# Patient Record
Sex: Female | Born: 1940 | Race: White | Hispanic: No | Marital: Married | Smoking: Never smoker
Health system: Southern US, Community
[De-identification: ages and names within clinical notes are randomized; demographics above are authoritative.]

## PROBLEM LIST (undated history)

## (undated) DIAGNOSIS — Z9889 Other specified postprocedural states: Secondary | ICD-10-CM

## (undated) DIAGNOSIS — E785 Hyperlipidemia, unspecified: Secondary | ICD-10-CM

## (undated) DIAGNOSIS — Z8739 Personal history of other diseases of the musculoskeletal system and connective tissue: Secondary | ICD-10-CM

## (undated) DIAGNOSIS — T8859XA Other complications of anesthesia, initial encounter: Secondary | ICD-10-CM

## (undated) DIAGNOSIS — R112 Nausea with vomiting, unspecified: Secondary | ICD-10-CM

## (undated) DIAGNOSIS — K219 Gastro-esophageal reflux disease without esophagitis: Secondary | ICD-10-CM

## (undated) HISTORY — PX: CARDIAC CATHETERIZATION: SHX172

## (undated) HISTORY — PX: PARTIAL HYSTERECTOMY: SHX80

## (undated) HISTORY — PX: OTHER SURGICAL HISTORY: SHX169

## (undated) HISTORY — DX: Hyperlipidemia, unspecified: E78.5

## (undated) HISTORY — PX: TONSILLECTOMY: SUR1361

## (undated) HISTORY — PX: ABDOMINAL HYSTERECTOMY: SHX81

## (undated) HISTORY — DX: Personal history of other diseases of the musculoskeletal system and connective tissue: Z87.39

---

## 2004-05-24 ENCOUNTER — Emergency Department (HOSPITAL_COMMUNITY): Admission: EM | Admit: 2004-05-24 | Discharge: 2004-05-24 | Payer: Self-pay | Admitting: *Deleted

## 2005-07-18 ENCOUNTER — Ambulatory Visit: Payer: Self-pay | Admitting: Internal Medicine

## 2006-08-07 ENCOUNTER — Ambulatory Visit: Payer: Self-pay | Admitting: Internal Medicine

## 2006-08-08 ENCOUNTER — Ambulatory Visit: Payer: Self-pay | Admitting: Internal Medicine

## 2006-09-01 ENCOUNTER — Encounter: Admission: RE | Admit: 2006-09-01 | Discharge: 2006-09-01 | Payer: Self-pay | Admitting: Internal Medicine

## 2007-02-11 ENCOUNTER — Ambulatory Visit: Payer: Self-pay | Admitting: Surgery

## 2007-04-02 ENCOUNTER — Ambulatory Visit: Payer: Self-pay | Admitting: Gastroenterology

## 2007-04-06 ENCOUNTER — Ambulatory Visit: Payer: Self-pay | Admitting: Gastroenterology

## 2007-08-13 ENCOUNTER — Ambulatory Visit: Payer: Self-pay | Admitting: Internal Medicine

## 2008-03-16 ENCOUNTER — Ambulatory Visit: Payer: Self-pay | Admitting: Cardiology

## 2008-03-16 ENCOUNTER — Encounter: Payer: Self-pay | Admitting: Cardiology

## 2008-03-16 LAB — CONVERTED CEMR LAB
BUN: 18 mg/dL (ref 6–23)
CO2: 23 meq/L (ref 19–32)
Calcium: 9.9 mg/dL (ref 8.4–10.5)
Chloride: 102 meq/L (ref 96–112)
Creatinine, Ser: 0.71 mg/dL (ref 0.40–1.20)
Glucose, Bld: 95 mg/dL (ref 70–99)
HCT: 42.1 % (ref 36.0–46.0)
Hemoglobin: 13.5 g/dL (ref 12.0–15.0)
INR: 0.9 (ref 0.0–1.5)
MCHC: 32.1 g/dL (ref 30.0–36.0)
MCV: 97.7 fL (ref 78.0–100.0)
Platelets: 263 10*3/uL (ref 150–400)
Potassium: 4.5 meq/L (ref 3.5–5.3)
Prothrombin Time: 12.5 s (ref 11.6–15.2)
RBC: 4.31 M/uL (ref 3.87–5.11)
RDW: 13.4 % (ref 11.5–15.5)
Sodium: 140 meq/L (ref 135–145)
WBC: 5.5 10*3/uL (ref 4.0–10.5)

## 2008-03-17 ENCOUNTER — Ambulatory Visit: Payer: Self-pay | Admitting: Cardiovascular Disease

## 2008-03-17 ENCOUNTER — Inpatient Hospital Stay (HOSPITAL_BASED_OUTPATIENT_CLINIC_OR_DEPARTMENT_OTHER): Admission: RE | Admit: 2008-03-17 | Discharge: 2008-03-17 | Payer: Self-pay | Admitting: Cardiology

## 2008-03-29 ENCOUNTER — Ambulatory Visit: Payer: Self-pay | Admitting: Cardiology

## 2008-09-08 ENCOUNTER — Ambulatory Visit: Payer: Self-pay | Admitting: Internal Medicine

## 2009-10-25 ENCOUNTER — Ambulatory Visit: Payer: No Typology Code available for payment source | Admitting: Internal Medicine

## 2010-01-08 ENCOUNTER — Encounter: Payer: Self-pay | Admitting: Cardiovascular Disease

## 2010-03-06 ENCOUNTER — Ambulatory Visit: Payer: Self-pay | Admitting: Cardiovascular Disease

## 2010-03-06 DIAGNOSIS — E785 Hyperlipidemia, unspecified: Secondary | ICD-10-CM | POA: Insufficient documentation

## 2010-03-06 DIAGNOSIS — I2510 Atherosclerotic heart disease of native coronary artery without angina pectoris: Secondary | ICD-10-CM | POA: Insufficient documentation

## 2010-03-15 ENCOUNTER — Ambulatory Visit: Payer: No Typology Code available for payment source | Admitting: Internal Medicine

## 2010-05-30 ENCOUNTER — Encounter: Admission: RE | Admit: 2010-05-30 | Discharge: 2010-05-30 | Payer: Self-pay | Admitting: Gastroenterology

## 2010-08-01 ENCOUNTER — Telehealth: Payer: Self-pay | Admitting: Cardiovascular Disease

## 2010-10-03 NOTE — Progress Notes (Signed)
Summary: RX  Phone Note Refill Request Call back at Home Phone [phone] Message from:  Patient on August 01, 2010 2:32 PM  GENERIC LIPITOR @ [facility] [address]  Initial call taken by: Harlon Flor,  August 01, 2010 2:32 PM  Follow-up for Phone Call        what dose of lipitor do you suggest she take? Follow-up by: Bishop Dublin, CMA,  August 03, 2010 12:00 PM  Additional Follow-up for Phone Call Additional follow up Details #1::        LM with patient waiting to hear back from Dr. Mariah Milling regarding dose of lipitor.  Patient has muscle aches with simvastatin has been taking 1/2 her dose. Additional Follow-up by: Lysbeth Galas CMA,  August 03, 2010 3:05 PM    Additional Follow-up for Phone Call Additional follow up Details #2::    Try lipitor 20 to start. recheck lipids in 3 months   Appended Document: RX notified patient need to stop the simvastatin and start on lipitor 20 mg one tablet at bedtime. Rx called into [facility] pharmacy.   Prescriptions: LIPITOR 20 MG TABS (ATORVASTATIN CALCIUM) one tablet at bedtime  #30 x 3   Entered by:   Bishop Dublin, CMA   Authorized by:   Dossie Arbour MD   Signed by:   Bishop Dublin, CMA on 08/06/2010   Method used:   Faxed to ...       [facility] (retail)       [address]       [place], [place]    [place]       Ph: [phone]       Fax: [phone]   RxID:   [id number] LIPITOR 20 MG TABS (ATORVASTATIN CALCIUM) one tablet at bedtime  #30 x 3   Entered by:   Bishop Dublin, CMA   Authorized by:   Dossie Arbour MD   Signed by:   Bishop Dublin, CMA on 08/06/2010   Method used:   Print then Give to Patient   RxID:   [id number]

## 2010-10-03 NOTE — Assessment & Plan Note (Signed)
Summary: EC6/AMD   Visit Type:  Follow-up Referring Provider:  Valera Castle, M.D. Primary Provider:  Thalia Party.  CC:  No cardiac problems..  History of Present Illness: 70 year old woman with a past medical history of chest discomfort, stress test that showed inferior wall ischemia, referred for cardiac catheterization that showed 30% nonobstructive LAD disease in July 2009, hyperlipidemia with normal systolic function who presents for routine followup.  Overall she states that she is doing well. She does not exercise to any significant degree. She is otherwise asymptomatic with no significant symptoms of shortness of breath, chest discomfort. She has been tolerating her medications well with no symptoms. She is otherwise active and has no complaints.  EKG shows normal sinus rhythm with a rate 77 beats per minute, no significant ST or T wave changes.  Preventive Screening-Counseling & Management  Caffeine-Diet-Exercise     Does Patient Exercise: no      Drug Use:  no.    Current Medications (verified): 1)  Boniva 2)  Aspirin 81 Mg Tbec (Aspirin) .Marland Kitchen.. 1 Once Daily 3)  Zocor 40 Mg Tabs (Simvastatin) .Marland Kitchen.. 1 Once Daily  Allergies (verified): No Known Drug Allergies  Past History:  Past Medical History: Last updated: 08/03/2009 1. Ther is no diabetes mellitus or hypertension, but there is hyperlipidemia 2. She has had a history of osteoporosis 3. She has had a prior partial hysterectomy and a tonsillectomy, but there are no other surgeries noted.  Family History: Last updated: 08/03/2009 Positive for coronary artery disease. Her father had coronary artery bypassing graft at age 20. Her mother has a history of congestive heart failure.   Social History: Last updated: 03/06/2010 She doesn't smoke or consume alcohol Regular Exercise - no Drug Use - no Retired  Married   Past Surgical History: partial hysterectomy tonsillectomy  Social History: She doesn't smoke or  consume alcohol Regular Exercise - no Drug Use - no Retired  Married  Does Patient Exercise:  no Drug Use:  no  Review of Systems  The patient denies fever, weight loss, weight gain, vision loss, decreased hearing, hoarseness, chest pain, syncope, dyspnea on exertion, peripheral edema, prolonged cough, abdominal pain, incontinence, muscle weakness, depression, and enlarged lymph nodes.    Vital Signs:  Patient profile:   70 year old female Height:      65 inches Weight:      110 pounds BMI:     18.37 Pulse rate:   77 / minute BP sitting:   112 / 69  (right arm) Cuff size:   regular  Vitals Entered By: Bishop Dublin, CMA (March 06, 2010 3:11 PM)  Physical Exam  General:  Well developed, well nourished, in no acute distress. Head:  normocephalic and atraumatic Neck:  Neck supple, no JVD. No masses, thyromegaly or abnormal cervical nodes. Chest Wall:  no deformities or breast masses noted Lungs:  Clear bilaterally to auscultation and percussion. Heart:  Non-displaced PMI, chest non-tender; regular rate and rhythm, S1, S2 without murmurs, rubs or gallops. Carotid upstroke normal, no bruit.  Pedals normal pulses. No edema, no varicosities. Abdomen:  Bowel sounds positive; abdomen soft and non-tender without masses Msk:  Back normal, normal gait. Muscle strength and tone normal. Pulses:  pulses normal in all 4 extremities Extremities:  No clubbing or cyanosis. Neurologic:  Alert and oriented x 3. Skin:  Intact without lesions or rashes. Psych:  Normal affect.   Impression & Recommendations:  Problem # 1:  CAD, NATIVE VESSEL (ICD-414.01) mild nonobstructive  LAD disease seen by cardiac catheterization in 2009. We'll try to obtain her most recent lipid panel from Dr. Hyacinth Meeker. Her goal LDL should be less than 100, preferably close to 70. We have talked her at length about cholesterol medications. We should not go up to a higher dose of simvastatin given the recent FDA  recommendation. One would be to change her to Crestor either 10 or 20 mg daily. Alternatively, we could try Vytorin 10/40 mg daily. She liked to check the prosthesis medicines. Alternatively we could start her on Lipitor 80 mg when it comes out generic later this year.  We have encouraged her to increase her exercise as she used to walk 3 times a week and now does not walk at all.  Her updated medication list for this problem includes:    Aspirin 81 Mg Tbec (Aspirin) .Marland Kitchen... 1 once daily  Other Orders: EKG w/ Interpretation (93000)  Patient Instructions: 1)  Your physician recommends that you continue on your current medications as directed. Please refer to the Current Medication list given to you today. 2)  Your physician wants you to follow-up in:   1 year You will receive a reminder letter in the mail two months in advance. If you don't receive a letter, please call our office to schedule the follow-up appointment.

## 2010-11-06 ENCOUNTER — Ambulatory Visit: Payer: No Typology Code available for payment source | Admitting: Internal Medicine

## 2011-01-15 NOTE — Assessment & Plan Note (Signed)
[facility] OFFICE NOTE   NAME:HARRELLKya, Mayfield                      MRN:          [id number]  DATE:03/29/2008                            DOB:          1941-07-13    Ms. Petitjean returns today after having cardiac catheterization for what  sounded like angina and a positive stress echocardiogram showing  inferior wall ischemia.   Her catheterization showed nonobstructive coronary artery disease with a  30% stenosis in the proximal LAD, otherwise no significant plaque.  She  has normal left ventricular function, EF of 55%.   She has had no angina or any symptoms since her catheterization.   Her medications are,  1. Zocor 40 mg a day.  2. Aspirin 81 mg a day.  She is carrying sublingual nitroglycerin.   I noticed that her blood work demonstrated an LDL of 113, though a HDL  of 75, total cholesterol of 200 on Zocor.   PHYSICAL EXAMINATION:  VITAL SIGNS:  Her blood pressure is 112/60, pulse  82 and regular, weight is 114.  GENERAL:  She is in no acute distress.  HEENT:  Unchanged.  NECK:  Carotid upstrokes are equal bilaterally without bruits.  No JVD.  Thyroid is not enlarged.  Trachea is midline.  LUNGS:  Clear.  HEART:  Regular rate and rhythm.  No gallop.  ABDOMEN:  Soft.  Her catheterization site is clean with no bruit.  EXTREMITIES:  Pulses are intact.  There is no edema.   I have explained everything to Mrs. Corsello today found on a cardiac  catheterization.  We talked about the importance of secondary prevention  with Zocor and aspirin, reducing plaque progression as well as plaque  rupture.   Dr. Hyacinth Meeker may wish to push her LDL lower, though the HDL 75 is quite  encouraging.  Before increase her Zocor to 80, I would probably just put  her on Crestor 20 for hopefully less side effects and more potency.  I  will leave this to Dr. Rondel Baton expertise.   We will plan on seeing her back p.r.n. or in 2  years.     Thomas C. Daleen Squibb, MD, [facility]  Electronically Signed    TCW/MedQ  DD: 03/29/2008  DT: 03/30/2008  Job #: [id number]   cc:   [name], M.D.

## 2011-01-15 NOTE — Assessment & Plan Note (Signed)
[facility] OFFICE NOTE   NAME:[name], Daniel                      MRN:          [id number]  DATE:03/16/2008                            DOB:          Jan 11, 1941    HISTORY OF PRESENT ILLNESS:  [name] is a very pleasant 70 year old  female who I am asked to evaluate for chest pain.  For the past 6 weeks,  she has noticed fatigue and a tired feeling in her chest when she  exerts herself to a more significant degree.  There is also a dull  sensation that radiates to her back.  There is no associated nausea,  vomiting, or diaphoresis, but there is shortness of breath.  Pain is not  pleuritic, positional, nor is it related to food.  Again, it only occurs  with more moderate activities.  It does not occur with routine  activities or at rest.  She has been seen by Dr. Hyacinth Meeker for these  symptoms and he was concerned about the possibility of angina.  He did  perform a stress echocardiogram on March 10, 2008.  The patient's resting  LV function was normal.  With exercise, there was evidence of inferior  wall ischemia by his report.  Because of the above, we were asked to  further evaluate.  Note, she does not have orthopnea, PND, pedal edema,  palpitations, presyncope, syncope, or claudication.   ALLERGIES:  She has no known drug allergies.   PRESENT MEDICATIONS:  1. Zocor 40 mg p.o. daily.  2. Aspirin 81 mg p.o. daily.  3. Plavix 75 mg p.o. daily.  4. Vitamin D and Calcium.  5. Boniva.  6. She takes sublingual nitroglycerin as needed.  7. Tylenol as needed.   SOCIAL HISTORY:  She does not smoke nor does she consume alcohol.   FAMILY HISTORY:  Positive for coronary artery disease.  Her father had  coronary artery bypassing graft at age 20.  Her mother has a history of  congestive heart failure.   PAST MEDICAL HISTORY:  1. There is no diabetes mellitus or hypertension, but there is      hyperlipidemia.  2.  She has had a history of osteoporosis.  3. She has had a prior partial hysterectomy and a tonsillectomy, but      there are no other surgeries noted.   REVIEW OF SYSTEMS:  She denies any headaches, fevers, or chills.  There  is no productive cough or hemoptysis.  There is no dysphagia,  odynophagia, melena, or hematochezia.  There is no dysuria or hematuria.  There is no rash or seizure activity.  There is no orthopnea, PND, or  pedal edema.  Remaining systems are negative.   PHYSICAL EXAMINATION:  VITAL SIGNS:  Today, shows a blood pressure of  104/64 and her pulse is 82.  She weighs 112 pounds.  GENERAL:  She is well developed and well nourished in no acute distress.  She does not appear to be depressed.  SKIN:  Warm and dry.  BACK:  Normal.  HEENT:  Normal with normal eyelids.  NECK:  Supple with a normal upstroke bilaterally.  No bruits noted.  There is no jugular venous distention, and I could not appreciate  thyromegaly.  CHEST:  Clear to auscultation.  Normal expansion.  CARDIOVASCULAR:  Regular rate and rhythm with normal S1 and S2.  There  are no murmurs, rubs, or gallops noted.  ABDOMEN:  No tenderness.  Nondistended.  Positive bowel sounds.  No  hepatosplenomegaly.  No mass is appreciated.  There is no abdominal  bruit.  She has 2+ femoral pulses bilaterally.  No bruits.  EXTREMITIES:  No edema.  I could palpate no cords.  She has 2+ dorsalis  pedis pulses bilaterally.  There is no peripheral clubbing.  NEUROLOGIC:  Grossly intact.   Her electrocardiogram today shows a sinus rhythm at a rate of 82.  There  is RV conduction delay, but there are no ST changes noted.   DIAGNOSES:  1. Probable new-onset angina - Ms. Donaghy's symptoms are concerning      for angina.  She also has a stress echocardiogram as interpreted by      Dr. Hyacinth Meeker that suggests inferior ischemia.  We will plan to      proceed with cardiac catheterization.  The risk and benefits have      been  discussed and she agrees to proceed.  Note, she has had no      symptoms at rest and they typically only occur with moderate      exertion.  I have asked her to be brought to the emergency room if      any symptoms or worsening prior to the procedure including any      symptoms at rest.  Note, she held her aspirin and Plavix as she was      not sure if she should stay on this prior to her catheterization.      I have asked that she resume this and she will also continue on her      statin.  We will also add low-dose Lopressor 12.5 mg p.o. b.i.d.      We will make further recommendations once the results of the      catheterization are known.  2. Hyperlipidemia - She will continue on her Zocor, and Dr. Hyacinth Meeker is      following her lipids and liver.  If she does in deed have      demonstrated coronary artery disease, then our goal LDL should be      less than 70.   She will return to [employer] in 4 weeks.     Madolyn Frieze Jens Som, MD, [facility]  Electronically Signed    BSC/MedQ  DD: 03/16/2008  DT: 03/16/2008  Job #: [id number]   cc:   Bethann Punches

## 2011-01-15 NOTE — Cardiovascular Report (Signed)
[name], [name]             ACCOUNT NO.:  [id number]   MEDICAL RECORD NO.:  [id number]          PATIENT TYPE:  OIB   LOCATION:  1963                         FACILITY:  [facility]   PHYSICIAN:  Veverly Fells. Excell Seltzer, MD  DATE OF BIRTH:  1941/05/11   DATE OF PROCEDURE:  03/17/2008  DATE OF DISCHARGE:  03/17/2008                            CARDIAC CATHETERIZATION   PROCEDURES:  Left heart catheterization, selective coronary angiography,  left ventricular angiography.   INDICATIONS:  This is a 70 year old woman with exertional chest  heaviness and very compelling symptoms for angina.  She underwent a  stress echocardiogram that demonstrated inferior wall ischemia.  She was  referred for cardiac catheterization.   PROCEDURAL DETAILS:  Risks and indications of the procedure were  reviewed with the patient.  Informed consent was obtained.  The right  groin was prepped, draped, and anesthetized with 1% lidocaine.  Using  the modified Seldinger technique, a 4-French sheath was placed in the  right femoral artery.  Standard 4-French Judkins catheters were used for  coronary angiography and left ventriculography.  A pull-back across the  aortic valve was performed.  All catheters were exchanged were performed  over a guidewire.  There were no immediate complications.  The patient  tolerated the procedure well.   FINDINGS:  Aortic pressure 106/53 with mean of 78, left ventricular  pressure 104/90.   Coronary angiography:  Left mainstem no significant stenosis, bifurcates  into the LAD and left circumflex.   LAD:  The LAD is a large-caliber vessel that courses down and wraps  around the LV apex.  There is a large first perforator that supplies  multiple perforating branches.  There are three main diagonal branches  from the LAD.  The first diagonal is large.  The second and third  diagonals are moderate-sized.  The proximal LAD just before the first  diagonal has an area of ectasia and  nonobstructive stenosis of 30%.  The  remaining portions of the mid and distal LAD have no significant  stenoses.   Left circumflex:  The left circumflex is widely patent throughout.  It  is a moderate-sized vessel.  The circumflex supplies small first and  second OM branches and a large third OM branch.  The AV groove  circumflex beyond the third OM is very small.  There are no significant  stenoses throughout the left circumflex.   Right coronary artery:  The right coronary artery is dominant.  It is a  moderate-size vessel throughout that supplies a large PDA branch and a  small posterolateral branch.  The right coronary artery is widely patent  with no significant stenoses throughout.   Left ventricular function assessed by left ventriculography is normal.  The left ventricular apex has a rounded appearance but contracts  normally.  The LVEF is estimated at 55%.  Of note, there is a persistent  Thebesian system noted.   ASSESSMENT:  1. Minor nonobstructive proximal left anterior descending stenosis.  2.:  No significant stenosis in the left circumflex or right coronary  artery.  1. Normal left ventricular function.   PLAN:  Recommend  medical therapy for nonobstructive CAD.  The patient to  follow up with Dr. Jens Som.      Veverly Fells. Excell Seltzer, MD  Electronically Signed     MDC/MEDQ  D:  03/17/2008  T:  03/18/2008  Job:  [id number]   cc:   Leonarda Salon Jens Som, MD, [facility]

## 2011-02-12 ENCOUNTER — Encounter: Payer: Self-pay | Admitting: Cardiology

## 2011-04-10 ENCOUNTER — Ambulatory Visit: Payer: No Typology Code available for payment source | Admitting: Internal Medicine

## 2011-04-29 ENCOUNTER — Ambulatory Visit: Payer: No Typology Code available for payment source | Admitting: Internal Medicine

## 2011-12-02 ENCOUNTER — Ambulatory Visit: Payer: Self-pay | Admitting: Internal Medicine

## 2011-12-11 ENCOUNTER — Ambulatory Visit: Payer: Self-pay | Admitting: Internal Medicine

## 2013-01-20 ENCOUNTER — Ambulatory Visit: Payer: Self-pay | Admitting: Internal Medicine

## 2013-09-29 ENCOUNTER — Ambulatory Visit (INDEPENDENT_AMBULATORY_CARE_PROVIDER_SITE_OTHER): Payer: Medicare Other | Admitting: Podiatry

## 2013-09-29 ENCOUNTER — Encounter: Payer: Self-pay | Admitting: Podiatry

## 2013-09-29 VITALS — BP 110/65 | HR 83 | Resp 16 | Ht 65.0 in | Wt 102.0 lb

## 2013-09-29 DIAGNOSIS — B351 Tinea unguium: Secondary | ICD-10-CM

## 2013-09-29 DIAGNOSIS — M79609 Pain in unspecified limb: Secondary | ICD-10-CM

## 2013-09-29 MED ORDER — EFINACONAZOLE 10 % EX SOLN: 1.0000 [drp] | Freq: Every day | CUTANEOUS | Status: DC

## 2013-09-29 NOTE — Progress Notes (Signed)
She presents today stating that she had pain recently to her hallux left. She states that woke her up and there'll the night with throbbing pain. She's noticed that there is some redness in the base of the toenail on the nail plate which is exquisitely tender on palpation.  Objective: Vital signs are stable she is alert and oriented x3. Pulses are palpable. Her nail plate does demonstrate what appears to the onychomycosis to the tip with to the borders however the proximal border does demonstrate a subungual hematoma that is present. I imagine that her pain was associated with the trauma that injured the nail plate in the first place.  Assessment: Traumatic nail plate injury hallux left with onychomycosis.  Plan: Discussed etiology pathology conservative or surgical therapies at this point I do believe we are going to start [place] will followup with her in 3 months

## 2013-12-27 ENCOUNTER — Other Ambulatory Visit: Payer: Self-pay | Admitting: *Deleted

## 2013-12-27 DIAGNOSIS — B351 Tinea unguium: Secondary | ICD-10-CM

## 2013-12-27 MED ORDER — EFINACONAZOLE 10 % EX SOLN: 1.0000 [drp] | Freq: Every day | CUTANEOUS | Status: DC

## 2013-12-27 NOTE — Telephone Encounter (Signed)
PT CALLED SAID [facility] WILL NO LONGER ACCEPT COUPON FOR JUBLIA. SENT RX FOR JUBLIA TO [facility] 4 ML, 11 REFILLS 10% SOLUTION APPLY 1 DROP TOPICALLY DAILY. I GAVE PT ALL INFO TO [facility]. PT WILL CALL WITH ALL HER INFO. TO PHARMACY.  PT UNDERSTOOD

## 2013-12-29 ENCOUNTER — Ambulatory Visit: Payer: No Typology Code available for payment source | Admitting: Podiatry

## 2013-12-31 ENCOUNTER — Telehealth: Payer: Self-pay | Admitting: *Deleted

## 2013-12-31 NOTE — Telephone Encounter (Signed)
PT CALLED HAD QUESTION REGARDING JUBLIA. CALLED AND LEFT MESSAGE ON PTS VOICEMAIL TO RETURN CALL.

## 2014-01-03 ENCOUNTER — Other Ambulatory Visit: Payer: Self-pay | Admitting: *Deleted

## 2014-01-03 MED ORDER — TAVABOROLE 5 % EX SOLN: CUTANEOUS | Status: DC

## 2014-01-03 NOTE — Telephone Encounter (Signed)
Going to try kerydin , it may be cheaper for the patient.

## 2014-02-09 ENCOUNTER — Emergency Department: Payer: Self-pay | Admitting: Emergency Medicine

## 2014-02-09 LAB — CBC
HCT: 37 % (ref 35.0–47.0)
HGB: 12.6 g/dL (ref 12.0–16.0)
MCH: 31.8 pg (ref 26.0–34.0)
MCHC: 34.1 g/dL (ref 32.0–36.0)
MCV: 93 fL (ref 80–100)
Platelet: 222 10*3/uL (ref 150–440)
RBC: 3.97 10*6/uL (ref 3.80–5.20)
RDW: 13.3 % (ref 11.5–14.5)
WBC: 7.7 10*3/uL (ref 3.6–11.0)

## 2014-02-09 LAB — BASIC METABOLIC PANEL
Anion Gap: 3 — ABNORMAL LOW (ref 7–16)
BUN: 21 mg/dL — ABNORMAL HIGH (ref 7–18)
Calcium, Total: 9 mg/dL (ref 8.5–10.1)
Chloride: 104 mmol/L (ref 98–107)
Co2: 29 mmol/L (ref 21–32)
Creatinine: 1.42 mg/dL — ABNORMAL HIGH (ref 0.60–1.30)
EGFR (African American): 42 — ABNORMAL LOW
EGFR (Non-African Amer.): 37 — ABNORMAL LOW
Glucose: 101 mg/dL — ABNORMAL HIGH (ref 65–99)
Osmolality: 275 (ref 275–301)
Potassium: 3.8 mmol/L (ref 3.5–5.1)
Sodium: 136 mmol/L (ref 136–145)

## 2014-02-09 LAB — TROPONIN I
Troponin-I: <0.02 ng/mL
Troponin-I: <0.02 ng/mL

## 2014-02-10 ENCOUNTER — Encounter (INDEPENDENT_AMBULATORY_CARE_PROVIDER_SITE_OTHER): Payer: Self-pay

## 2014-02-10 ENCOUNTER — Ambulatory Visit (INDEPENDENT_AMBULATORY_CARE_PROVIDER_SITE_OTHER): Payer: Medicare Other | Admitting: Cardiovascular Disease

## 2014-02-10 ENCOUNTER — Encounter: Payer: Self-pay | Admitting: Cardiovascular Disease

## 2014-02-10 VITALS — BP 100/62 | HR 82 | Ht 65.0 in | Wt 105.2 lb

## 2014-02-10 DIAGNOSIS — K219 Gastro-esophageal reflux disease without esophagitis: Secondary | ICD-10-CM

## 2014-02-10 DIAGNOSIS — R079 Chest pain, unspecified: Secondary | ICD-10-CM

## 2014-02-10 DIAGNOSIS — I2510 Atherosclerotic heart disease of native coronary artery without angina pectoris: Secondary | ICD-10-CM

## 2014-02-10 DIAGNOSIS — E785 Hyperlipidemia, unspecified: Secondary | ICD-10-CM

## 2014-02-10 NOTE — Patient Instructions (Addendum)
[place]  Your caregiver has ordered a Stress Test with nuclear imaging. The purpose of this test is to evaluate the blood supply to your heart muscle. This procedure is referred to as a "Non-Invasive Stress Test." This is because other than having an IV started in your vein, nothing is inserted or "invades" your body. Cardiac stress tests are done to find areas of poor blood flow to the heart by determining the extent of coronary artery disease (CAD). Some patients exercise on a treadmill, which naturally increases the blood flow to your heart, while others who are  unable to walk on a treadmill due to physical limitations have a pharmacologic/chemical stress agent called Lexiscan . This medicine will mimic walking on a treadmill by temporarily increasing your coronary blood flow.   Please note: these test may take anywhere between 2-4 hours to complete  PLEASE REPORT TO [facility] AT THE FIRST DESK WILL DIRECT YOU WHERE TO GO  Date of Procedure:_____________6/15/15________________________  Arrival Time for Procedure:__________0745 AM____________________  PLEASE NOTIFY THE OFFICE AT LEAST 24 HOURS IN ADVANCE IF YOU ARE UNABLE TO KEEP YOUR APPOINTMENT.  [phone] AND  PLEASE NOTIFY NUCLEAR MEDICINE AT [facility] AT LEAST 24 HOURS IN ADVANCE IF YOU ARE UNABLE TO KEEP YOUR APPOINTMENT. [phone]  How to prepare for your Myoview test:  1. Do not eat or drink after midnight 2. No caffeine for 24 hours prior to test 3. No smoking 24 hours prior to test. 4. Your medication may be taken with water.  If your doctor stopped a medication because of this test, do not take that medication. 5. Ladies, please do not wear dresses.  Skirts or pants are appropriate. Please wear a short sleeve shirt. 6. No perfume, cologne or lotion. 7. Wear comfortable walking shoes. No heels!  Your physician recommends that you schedule a follow-up appointment in:  As needed

## 2014-02-10 NOTE — Progress Notes (Signed)
Primary care physician: Dr. Emily Filbert  HPI  This is a 73 year-old woman who was referred from the emergency room at [facility] for evaluation of chest pain. She was seen in the past by our team but not recently. She was evaluated in 2009 due to atypical chest discomfort. Stress test was abnormal and showed possible inferior wall ischemia. However, cardiac catheterization  showed 30% nonobstructive LAD disease . She has known history of hyperlipidemia currently on pravastatin but no history of diabetes or hypertension. She is not a smoker. She does have family history of coronary artery disease. Father had CABG at the age of 17. Mother had coronary artery disease but later in life. The patient had a recent episode of substernal aching and heartburn sensation which lasted for 2 hours. This happened yesterday. She went to the emergency room at [facility]. EKG showed no acute changes. Labs were unremarkable including negative cardiac enzymes. Creatinine was mildly elevated at 1.42. Chest x-ray showed no acute abnormalities. The chest discomfort has been continuous since then but overall is mild. It does not worsen with physical activities. It does not seem to be related to food. She does have significant heartburn. No diagnosis was gastroesophageal reflux disease. She was discharged home on Nexium. She reports a 6 pound weight loss over the last year. She has been under stress.  No Known Allergies   Current Outpatient Prescriptions on File Prior to Visit  Medication Sig Dispense Refill  . aspirin 81 MG EC tablet Take 81 mg by mouth daily.        . Denosumab (PROLIA [place]) Inject into the skin. Semi annual injection      . pravastatin (PRAVACHOL) 40 MG tablet Take 40 mg by mouth daily.       No current facility-administered medications on file prior to visit.     Past Medical History  Diagnosis Date  . Hyperlipidemia   . History of osteoporosis      Past Surgical History  Procedure Laterality Date  .  Partial hysterectomy    . Tonsillectomy    . Cardiac catheterization      [facility]  . Cataract       Family History  Problem Relation Age of Onset  . Heart failure Mother     CHF  . Transient ischemic attack Mother   . Coronary artery disease Other   . Heart disease Father   . Heart failure Father      History   Social History  . Marital Status: Married    Spouse Name: N/A    Number of Children: N/A  . Years of Education: N/A   Occupational History  . Retired    Social History Main Topics  . Smoking status: Never Smoker   . Smokeless tobacco: Not on file  . Alcohol Use: No  . Drug Use: No  . Sexual Activity: Not on file   Other Topics Concern  . Not on file   Social History Narrative   Married   Does not get regular exercise     ROS A 10 point review of system was performed. It is negative other than that mentioned in the history of present illness.   PHYSICAL EXAM   BP 100/62  Pulse 82  Ht 5\' 5"  (1.651 m)  Wt 105 lb 4 oz (47.741 kg)  BMI 17.51 kg/m2 Constitutional: She is oriented to person, place, and time. She appears well-developed and well-nourished. No distress.  HENT: No nasal discharge.  Head:  Normocephalic and atraumatic.  Eyes: Pupils are equal and round. No discharge.  Neck: Normal range of motion. Neck supple. No JVD present. No thyromegaly present.  Cardiovascular: Normal rate, regular rhythm, normal heart sounds. Exam reveals no gallop and no friction rub. No murmur heard.  Pulmonary/Chest: Effort normal and breath sounds normal. No stridor. No respiratory distress. She has no wheezes. She has no rales. She exhibits no tenderness.  Abdominal: Soft. Bowel sounds are normal. She exhibits no distension. There is no tenderness. There is no rebound and no guarding.  Musculoskeletal: Normal range of motion. She exhibits no edema and no tenderness.  Neurological: She is alert and oriented to person, place, and time. Coordination normal.  Skin:  Skin is warm and dry. No rash noted. She is not diaphoretic. No erythema. No pallor.  Psychiatric: She has a normal mood and affect. Her behavior is normal. Judgment and thought content normal.     [id number]  Rhythm  WITHIN NORMAL LIMITS   ASSESSMENT AND PLAN

## 2014-02-11 ENCOUNTER — Encounter: Payer: Self-pay | Admitting: Cardiovascular Disease

## 2014-02-11 DIAGNOSIS — K219 Gastro-esophageal reflux disease without esophagitis: Secondary | ICD-10-CM | POA: Insufficient documentation

## 2014-02-11 NOTE — Assessment & Plan Note (Signed)
This is currently being treated with pravastatin.

## 2014-02-11 NOTE — Assessment & Plan Note (Signed)
The patient has known history of mild nonobstructive coronary artery disease on previous cardiac catheterization in 2009 which showed 30% stenosis in the LAD. Current symptoms are atypical and likely GI in nature. However, she had associated exertional dyspnea. Thus, I recommend evaluation with a treadmill nuclear stress test. Continue treatment of risk factors.

## 2014-02-11 NOTE — Assessment & Plan Note (Signed)
Continue treatment with the PPI. We definitely have to exclude a cardiac etiology with atypical presentation. If cardiac workup is negative and she continues to have symptoms, further GI evaluation might be warranted.

## 2014-02-14 ENCOUNTER — Ambulatory Visit: Payer: Self-pay | Admitting: Cardiovascular Disease

## 2014-02-14 ENCOUNTER — Other Ambulatory Visit: Payer: Self-pay

## 2014-02-14 DIAGNOSIS — R079 Chest pain, unspecified: Secondary | ICD-10-CM

## 2015-06-22 ENCOUNTER — Ambulatory Visit
Admission: RE | Admit: 2015-06-22 | Discharge: 2015-06-22 | Disposition: A | Discharge status: Home / Self Care | Payer: Medicare Other | Source: Ambulatory Visit | Attending: Gastroenterology | Admitting: Gastroenterology

## 2015-06-22 ENCOUNTER — Encounter
Admission: RE | Disposition: A | Discharge status: Home / Self Care | Payer: Self-pay | Source: Ambulatory Visit | Attending: Gastroenterology

## 2015-06-22 ENCOUNTER — Ambulatory Visit: Payer: Medicare Other | Admitting: Certified Registered Nurse Anesthetist

## 2015-06-22 ENCOUNTER — Encounter: Payer: Self-pay | Admitting: *Deleted

## 2015-06-22 DIAGNOSIS — Z79899 Other long term (current) drug therapy: Secondary | ICD-10-CM | POA: Insufficient documentation

## 2015-06-22 DIAGNOSIS — Z8489 Family history of other specified conditions: Secondary | ICD-10-CM | POA: Diagnosis not present

## 2015-06-22 DIAGNOSIS — Z7982 Long term (current) use of aspirin: Secondary | ICD-10-CM | POA: Insufficient documentation

## 2015-06-22 DIAGNOSIS — Z9889 Other specified postprocedural states: Secondary | ICD-10-CM | POA: Diagnosis not present

## 2015-06-22 DIAGNOSIS — K625 Hemorrhage of anus and rectum: Secondary | ICD-10-CM | POA: Diagnosis present

## 2015-06-22 DIAGNOSIS — Z9849 Cataract extraction status, unspecified eye: Secondary | ICD-10-CM | POA: Insufficient documentation

## 2015-06-22 DIAGNOSIS — Z8249 Family history of ischemic heart disease and other diseases of the circulatory system: Secondary | ICD-10-CM | POA: Diagnosis not present

## 2015-06-22 DIAGNOSIS — K5730 Diverticulosis of large intestine without perforation or abscess without bleeding: Secondary | ICD-10-CM | POA: Insufficient documentation

## 2015-06-22 DIAGNOSIS — R1310 Dysphagia, unspecified: Secondary | ICD-10-CM | POA: Diagnosis present

## 2015-06-22 DIAGNOSIS — M810 Age-related osteoporosis without current pathological fracture: Secondary | ICD-10-CM | POA: Diagnosis not present

## 2015-06-22 DIAGNOSIS — E785 Hyperlipidemia, unspecified: Secondary | ICD-10-CM | POA: Insufficient documentation

## 2015-06-22 HISTORY — PX: COLONOSCOPY WITH PROPOFOL: SHX5780

## 2015-06-22 HISTORY — PX: ESOPHAGOGASTRODUODENOSCOPY (EGD) WITH PROPOFOL: SHX5813

## 2015-06-22 SURGERY — COLONOSCOPY WITH PROPOFOL
Anesthesia: General

## 2015-06-22 MED ORDER — PROPOFOL 10 MG/ML IV BOLUS
INTRAVENOUS | Status: DC | PRN
  Administered 2015-06-22: 50 mg via INTRAVENOUS
  Administered 2015-06-22: 30 mg via INTRAVENOUS

## 2015-06-22 MED ORDER — MIDAZOLAM HCL 2 MG/2ML IJ SOLN
INTRAMUSCULAR | Status: DC | PRN
  Administered 2015-06-22: 0.5 mg via INTRAVENOUS

## 2015-06-22 MED ORDER — PROPOFOL 500 MG/50ML IV EMUL
INTRAVENOUS | Status: DC | PRN
  Administered 2015-06-22: 120 ug/kg/min via INTRAVENOUS

## 2015-06-22 MED ORDER — SODIUM CHLORIDE 0.9 % IV SOLN
INTRAVENOUS | Status: DC
  Administered 2015-06-22: 100 mL via INTRAVENOUS

## 2015-06-22 MED ORDER — LIDOCAINE HCL (CARDIAC) 20 MG/ML IV SOLN
INTRAVENOUS | Status: DC | PRN
  Administered 2015-06-22: 100 mg via INTRAVENOUS

## 2015-06-22 NOTE — Anesthesia Preprocedure Evaluation (Addendum)
Anesthesia Evaluation  Patient identified by MRN, date of birth, ID band Patient awake    Reviewed: Allergy & Precautions, H&P , NPO status , Patient's Chart, lab work & pertinent test results  History of Anesthesia Complications Negative for: history of anesthetic complications  Airway Mallampati: III  TM Distance: <3 FB Neck ROM: limited    Dental no notable dental hx. (+) Teeth Intact   Pulmonary neg pulmonary ROS,    Pulmonary exam normal breath sounds clear to auscultation       Cardiovascular (-) angina(-) Past MI and (-) DOE negative cardio ROS Normal cardiovascular exam Rhythm:regular Rate:Normal     Neuro/Psych negative neurological ROS  negative psych ROS   GI/Hepatic negative GI ROS, Neg liver ROS, GERD  Controlled,  Endo/Other  negative endocrine ROS  Renal/GU negative Renal ROS  negative genitourinary   Musculoskeletal   Abdominal   Peds  Hematology negative hematology ROS (+)   Anesthesia Other Findings Past Medical History:   Hyperlipidemia                                               History of osteoporosis                                     Past Surgical History:   PARTIAL HYSTERECTOMY                                          TONSILLECTOMY                                                 CARDIAC CATHETERIZATION                                         Comment:[facility]   cataract                                                     BMI    Body Mass Index   18.87 kg/m 2      Reproductive/Obstetrics negative OB ROS                            Anesthesia Physical Anesthesia Plan  ASA: III  Anesthesia Plan: General   Post-op Pain Management:    Induction:   Airway Management Planned:   Additional Equipment:   Intra-op Plan:   Post-operative Plan:   Informed Consent: I have reviewed the patients History and Physical, chart, labs and discussed the procedure  including the risks, benefits and alternatives for the proposed anesthesia with the patient or authorized representative who has indicated his/her understanding and acceptance.   Dental Advisory Given  Plan Discussed with: Anesthesiologist, CRNA and Surgeon  Anesthesia Plan Comments:  Anesthesia Quick Evaluation  

## 2015-06-22 NOTE — Anesthesia Procedure Notes (Signed)
Performed by: Johnna Acosta Pre-anesthesia Checklist: Patient identified, Emergency Drugs available and Suction available Patient Re-evaluated:Patient Re-evaluated prior to inductionOxygen Delivery Method: Nasal cannula

## 2015-06-22 NOTE — Transfer of Care (Signed)
Immediate Anesthesia Transfer of Care Note  Patient: [name]  Procedure(s) Performed: Procedure(s): COLONOSCOPY WITH PROPOFOL (N/A) ESOPHAGOGASTRODUODENOSCOPY (EGD) WITH PROPOFOL (N/A)  Patient Location: PACU  Anesthesia Type:  Level of Consciousness: sedated  Airway & Oxygen Therapy: Patient Spontanous Breathing  Post-op Assessment: Report given to RN  Post vital signs: Reviewed and stable  Last Vitals:  Filed Vitals:   06/22/15 1047  BP: 106/48  Pulse: 94  Temp: 35.7 C  Resp: 14    Complications: No apparent anesthesia complications

## 2015-06-22 NOTE — Progress Notes (Signed)
   06/22/15 1000  Aldrete  Activity 0  Respiration 1  Circulation 1  Consciousness 0  Color 2  Aldrete Score 4     06/22/15 1000  Aldrete  Activity 0  Respiration 1  Circulation 1  Consciousness 0  Color 2  Aldrete Score 4

## 2015-06-22 NOTE — H&P (Signed)
    Primary Care Physician:  Rusty Aus., MD Primary Gastroenterologist:  Dr. Candace Cruise  Pre-Procedure History & Physical: HPI:  [name] is a 74 y.o. female is here for an colonoscopy.   Past Medical History  Diagnosis Date  . Hyperlipidemia   . History of osteoporosis     Past Surgical History  Procedure Laterality Date  . Partial hysterectomy    . Tonsillectomy    . Cardiac catheterization      [facility]  . Cataract      Prior to Admission medications   Medication Sig Start Date End Date Taking? Authorizing Provider  ALPRAZolam Duanne Moron) 0.25 MG tablet Take 0.25 mg by mouth at bedtime as needed for anxiety.   Yes Historical Provider, MD  cholecalciferol (VITAMIN D) 400 UNITS TABS tablet Take 2,000 Units by mouth.   Yes Historical Provider, MD  Ferrous Fumarate-Vitamin C 65-125 MG TABS Take by mouth.   Yes Historical Provider, MD  Multiple Vitamin (MULTIVITAMIN) tablet Take 1 tablet by mouth daily.   Yes Historical Provider, MD  omeprazole (PRILOSEC) 40 MG capsule Take 40 mg by mouth daily.   Yes Historical Provider, MD  aspirin 81 MG EC tablet Take 81 mg by mouth daily.      Historical Provider, MD  Denosumab (PROLIA [place]) Inject into the skin. Semi annual injection    Historical Provider, MD  pravastatin (PRAVACHOL) 40 MG tablet Take 40 mg by mouth daily.    Historical Provider, MD  Tavaborole 5 % SOLN Apply topically daily.    Historical Provider, MD    Allergies as of 05/24/2015  . (No Known Allergies)    Family History  Problem Relation Age of Onset  . Heart failure Mother     CHF  . Transient ischemic attack Mother   . Coronary artery disease Other   . Heart disease Father   . Heart failure Father     Social History   Social History  . Marital Status: Married    Spouse Name: N/A  . Number of Children: N/A  . Years of Education: N/A   Occupational History  . Retired    Social History Main Topics  . Smoking status: Never Smoker   . Smokeless tobacco:  Not on file  . Alcohol Use: No  . Drug Use: No  . Sexual Activity: Not on file   Other Topics Concern  . Not on file   Social History Narrative   Married   Does not get regular exercise    Review of Systems: See HPI, otherwise negative ROS  Physical Exam: There were no vitals taken for this visit. General:   Alert,  pleasant and cooperative in NAD Head:  Normocephalic and atraumatic. Neck:  Supple; no masses or thyromegaly. Lungs:  Clear throughout to auscultation.    Heart:  Regular rate and rhythm. Abdomen:  Soft, nontender and nondistended. Normal bowel sounds, without guarding, and without rebound.   Neurologic:  Alert and  oriented x4;  grossly normal neurologically.  Impression/Plan: [name] is here for an colonoscopy to be performed for dysphagia, rectal bleeding.  Risks, benefits, limitations, and alternatives regarding EGD/colonoscopy have been reviewed with the patient.  Questions have been answered.  All parties agreeable.   Alexandr Yaworski, Lupita Dawn, MD  06/22/2015, 9:32 AM

## 2015-06-22 NOTE — Op Note (Signed)
[facility] Patient Name: [name] Procedure Date: 06/22/2015 10:26 AM MRN: [id number] Account #: [id number] Date of Birth: 15-May-1941 Admit Type: Outpatient Age: 74 Room: [facility] ENDO ROOM 4 Gender: Female Note Status: Finalized Procedure:         Colonoscopy Indications:       Rectal bleeding Providers:         Lupita Dawn. Candace Cruise, MD Referring MD:      Rusty Aus, MD (Referring MD) Medicines:         Monitored Anesthesia Care Complications:     No immediate complications. Procedure:         Pre-Anesthesia Assessment:                    - Prior to the procedure, a History and Physical was                     performed, and patient medications, allergies and                     sensitivities were reviewed. The patient's tolerance of                     previous anesthesia was reviewed.                    - The risks and benefits of the procedure and the sedation                     options and risks were discussed with the patient. All                     questions were answered and informed consent was obtained.                    - After reviewing the risks and benefits, the patient was                     deemed in satisfactory condition to undergo the procedure.                    After obtaining informed consent, the colonoscope was                     passed under direct vision. Throughout the procedure, the                     patient's blood pressure, pulse, and oxygen saturations                     were monitored continuously. The Colonoscope was                     introduced through the anus and advanced to the the cecum,                     identified by appendiceal orifice and ileocecal valve. The                     colonoscopy was performed without difficulty. The patient                     tolerated the procedure well. The quality of the bowel  preparation was good. Findings:      A few small-mouthed  diverticula were found in the sigmoid colon.      The exam was otherwise without abnormality. Impression:        - Diverticulosis in the sigmoid colon.                    - The examination was otherwise normal.                    - No specimens collected. Recommendation:    - Discharge patient to home.                    - The findings and recommendations were discussed with the                     patient. Procedure Code(s): --- Professional ---                    [phone], Colonoscopy, flexible; diagnostic, including                     collection of specimen(s) by brushing or washing, when                     performed (separate procedure) Diagnosis Code(s): --- Professional ---                    K62.5, Hemorrhage of anus and rectum                    K57.30, Diverticulosis of large intestine without                     perforation or abscess without bleeding CPT copyright 2014 American Medical Association. All rights reserved. The codes documented in this report are preliminary and upon coder review may  be revised to meet current compliance requirements. Hulen Luster, MD 06/22/2015 10:46:00 AM This report has been signed electronically. Number of Addenda: 0 Note Initiated On: 06/22/2015 10:26 AM Scope Withdrawal Time: 0 hours 3 minutes 14 seconds  Total Procedure Duration: 0 hours 7 minutes 23 seconds       [facility]

## 2015-06-22 NOTE — Op Note (Signed)
[facility] Patient Name: [name] Procedure Date: 06/22/2015 10:27 AM MRN: [id number] Account #: [id number] Date of Birth: Aug 16, 1941 Admit Type: Outpatient Age: 74 Room: [facility] ENDO ROOM 4 Gender: Female Note Status: Finalized Procedure:         Upper GI endoscopy Indications:       Dysphagia Providers:         Lupita Dawn. Candace Cruise, MD Referring MD:      Rusty Aus, MD (Referring MD) Medicines:         Monitored Anesthesia Care Complications:     No immediate complications. Procedure:         Pre-Anesthesia Assessment:                    - Prior to the procedure, a History and Physical was                     performed, and patient medications, allergies and                     sensitivities were reviewed. The patient's tolerance of                     previous anesthesia was reviewed.                    - The risks and benefits of the procedure and the sedation                     options and risks were discussed with the patient. All                     questions were answered and informed consent was obtained.                    - After reviewing the risks and benefits, the patient was                     deemed in satisfactory condition to undergo the procedure.                    After obtaining informed consent, the endoscope was passed                     under direct vision. Throughout the procedure, the                     patient's blood pressure, pulse, and oxygen saturations                     were monitored continuously. The Endoscope was introduced                     through the mouth, and advanced to the second part of                     duodenum. The upper GI endoscopy was accomplished without                     difficulty. The patient tolerated the procedure well. Findings:      No endoscopic abnormality was evident in the esophagus to explain the       patient's complaint of dysphagia. It was decided, however, to proceed        with dilation  of the entire esophagus. The scope was withdrawn. Dilation       was performed with a Maloney dilator with mild resistance at 57 Fr.      The entire examined stomach was normal.      The examined duodenum was normal. Impression:        - No endoscopic esophageal abnormality to explain                     patient's dysphagia. Esophagus dilated. Dilated.                    - Normal stomach.                    - Normal examined duodenum.                    - No specimens collected. Recommendation:    - Discharge patient to home.                    - Observe patient's clinical course.                    - The findings and recommendations were discussed with the                     patient. Procedure Code(s): --- Professional ---                    [phone], Esophagogastroduodenoscopy, flexible, transoral;                     diagnostic, including collection of specimen(s) by                     brushing or washing, when performed (separate procedure)                    43450, Dilation of esophagus, by unguided sound or bougie,                     single or multiple passes Diagnosis Code(s): --- Professional ---                    R13.10, Dysphagia, unspecified CPT copyright 2014 American Medical Association. All rights reserved. The codes documented in this report are preliminary and upon coder review may  be revised to meet current compliance requirements. Hulen Luster, MD 06/22/2015 10:35:00 AM This report has been signed electronically. Number of Addenda: 0 Note Initiated On: 06/22/2015 10:27 AM      [facility]

## 2015-06-22 NOTE — Anesthesia Postprocedure Evaluation (Signed)
  Anesthesia Post-op Note  Patient: [name]  Procedure(s) Performed: Procedure(s): COLONOSCOPY WITH PROPOFOL (N/A) ESOPHAGOGASTRODUODENOSCOPY (EGD) WITH PROPOFOL (N/A)  Anesthesia type:General  Patient location: PACU  Post pain: Pain level controlled  Post assessment: Post-op Vital signs reviewed, Patient's Cardiovascular Status Stable, Respiratory Function Stable, Patent Airway and No signs of Nausea or vomiting  Post vital signs: Reviewed and stable  Last Vitals:  Filed Vitals:   06/22/15 1115  BP: 107/73  Pulse:   Temp:   Resp:     Level of consciousness: awake, alert  and patient cooperative  Complications: No apparent anesthesia complications

## 2015-06-23 ENCOUNTER — Encounter: Payer: Self-pay | Admitting: Gastroenterology

## 2015-07-11 ENCOUNTER — Other Ambulatory Visit: Payer: Self-pay | Admitting: Sports Medicine

## 2015-07-11 DIAGNOSIS — Z1231 Encounter for screening mammogram for malignant neoplasm of breast: Secondary | ICD-10-CM

## 2015-07-18 ENCOUNTER — Ambulatory Visit
Admission: RE | Admit: 2015-07-18 | Discharge: 2015-07-18 | Disposition: A | Discharge status: Home / Self Care | Payer: Medicare Other | Source: Ambulatory Visit | Attending: Sports Medicine | Admitting: Sports Medicine

## 2015-07-18 ENCOUNTER — Ambulatory Visit: Payer: No Typology Code available for payment source

## 2015-07-18 ENCOUNTER — Other Ambulatory Visit: Payer: Self-pay | Admitting: Sports Medicine

## 2015-07-18 DIAGNOSIS — Z1231 Encounter for screening mammogram for malignant neoplasm of breast: Secondary | ICD-10-CM

## 2016-07-17 ENCOUNTER — Other Ambulatory Visit: Payer: Self-pay | Admitting: Internal Medicine

## 2016-07-17 DIAGNOSIS — Z1231 Encounter for screening mammogram for malignant neoplasm of breast: Secondary | ICD-10-CM

## 2016-08-27 ENCOUNTER — Ambulatory Visit
Admission: RE | Admit: 2016-08-27 | Discharge: 2016-08-27 | Disposition: A | Discharge status: Home / Self Care | Payer: Medicare Other | Source: Ambulatory Visit | Attending: Internal Medicine | Admitting: Internal Medicine

## 2016-08-27 DIAGNOSIS — Z1231 Encounter for screening mammogram for malignant neoplasm of breast: Secondary | ICD-10-CM

## 2017-07-29 ENCOUNTER — Other Ambulatory Visit: Payer: Self-pay | Admitting: Internal Medicine

## 2017-07-29 DIAGNOSIS — Z1231 Encounter for screening mammogram for malignant neoplasm of breast: Secondary | ICD-10-CM

## 2017-08-28 ENCOUNTER — Ambulatory Visit
Admission: RE | Admit: 2017-08-28 | Discharge: 2017-08-28 | Disposition: A | Discharge status: Home / Self Care | Payer: Medicare Other | Source: Ambulatory Visit | Attending: Internal Medicine | Admitting: Internal Medicine

## 2017-08-28 DIAGNOSIS — Z1231 Encounter for screening mammogram for malignant neoplasm of breast: Secondary | ICD-10-CM | POA: Insufficient documentation

## 2018-07-22 ENCOUNTER — Other Ambulatory Visit: Payer: Self-pay | Admitting: Internal Medicine

## 2018-07-22 DIAGNOSIS — Z1231 Encounter for screening mammogram for malignant neoplasm of breast: Secondary | ICD-10-CM

## 2018-08-31 ENCOUNTER — Ambulatory Visit
Admission: RE | Admit: 2018-08-31 | Discharge: 2018-08-31 | Disposition: A | Discharge status: Home / Self Care | Payer: Medicare Other | Source: Ambulatory Visit | Attending: Internal Medicine | Admitting: Internal Medicine

## 2018-08-31 DIAGNOSIS — Z1231 Encounter for screening mammogram for malignant neoplasm of breast: Secondary | ICD-10-CM

## 2019-08-30 ENCOUNTER — Other Ambulatory Visit: Payer: Self-pay | Admitting: Internal Medicine

## 2019-08-30 DIAGNOSIS — Z1231 Encounter for screening mammogram for malignant neoplasm of breast: Secondary | ICD-10-CM

## 2019-09-08 ENCOUNTER — Ambulatory Visit
Admission: RE | Admit: 2019-09-08 | Discharge: 2019-09-08 | Disposition: A | Discharge status: Home / Self Care | Payer: Medicare HMO | Source: Ambulatory Visit | Attending: Internal Medicine | Admitting: Internal Medicine

## 2019-09-08 DIAGNOSIS — Z1231 Encounter for screening mammogram for malignant neoplasm of breast: Secondary | ICD-10-CM | POA: Diagnosis not present

## 2019-11-10 DIAGNOSIS — L570 Actinic keratosis: Secondary | ICD-10-CM

## 2019-11-10 DIAGNOSIS — C4492 Squamous cell carcinoma of skin, unspecified: Secondary | ICD-10-CM

## 2019-11-10 HISTORY — DX: Actinic keratosis: L57.0

## 2019-11-10 HISTORY — DX: Squamous cell carcinoma of skin, unspecified: C44.92

## 2019-11-17 ENCOUNTER — Telehealth: Payer: Self-pay | Admitting: Dermatology

## 2019-11-17 NOTE — Telephone Encounter (Signed)
Spoke with Ms. 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   Exam of left cheek showed 1.4 cm brown patch, so it is unclear if the entire area is an SCCis or if an early SCCis arose within a thin SK or lentigo. Discussed treatment options including 5FU, Mohs surgery or ED&C including risks and benefits to each.   Will check patient in clinic and further discuss options for SCCis of left cheek and also treat AK at shoulder with LN2.   Abby or Claiborne Billings, can you please schedule patient for follow-up in the next approximately 3 weeks? Thank you!

## 2019-11-18 NOTE — Telephone Encounter (Signed)
Called patient and she is scheduled to come in for a 3 week followup on 12/06/19 at 1:30pm.

## 2019-11-24 ENCOUNTER — Telehealth: Payer: Self-pay

## 2019-11-24 NOTE — Telephone Encounter (Signed)
Patient can absolutely go for Mohs for the [facility] on her cheek. We discussed options in depth. I do not recall which surgeon she prefers to see - Dr. Manley Mason at [facility], Cherry Log in [place] or Dr. Lacinda Axon at [facility] but any are fine.  I will still see her in follow-up and treat her AK.   Thank you!

## 2019-11-24 NOTE — Telephone Encounter (Signed)
Patient has called in today from her [place] collected on 11/10/19 in [place]. She was to see you for a upcoming appointment and discuss treatment options but patient has decided she would like to go for [facility]. Please advise.

## 2019-11-26 NOTE — Telephone Encounter (Signed)
Spoke with patient this morning and advised her of information per Dr. Laurence Ferrari. I will send referral to Dr. Lacinda Axon per patient's request next week.

## 2019-11-30 ENCOUNTER — Other Ambulatory Visit: Payer: Self-pay

## 2019-11-30 DIAGNOSIS — D0439 Carcinoma in situ of skin of other parts of face: Secondary | ICD-10-CM

## 2019-11-30 NOTE — Progress Notes (Signed)
Referral to [facility] to Dr. Lacinda Axon for [place].

## 2019-12-06 ENCOUNTER — Encounter: Payer: Self-pay | Admitting: Dermatology

## 2019-12-06 ENCOUNTER — Other Ambulatory Visit: Payer: Self-pay

## 2019-12-06 ENCOUNTER — Ambulatory Visit: Payer: Medicare HMO | Admitting: Dermatology

## 2019-12-06 DIAGNOSIS — C44329 Squamous cell carcinoma of skin of other parts of face: Secondary | ICD-10-CM

## 2019-12-06 DIAGNOSIS — L578 Other skin changes due to chronic exposure to nonionizing radiation: Secondary | ICD-10-CM | POA: Diagnosis not present

## 2019-12-06 DIAGNOSIS — Z872 Personal history of diseases of the skin and subcutaneous tissue: Secondary | ICD-10-CM | POA: Diagnosis not present

## 2019-12-06 DIAGNOSIS — L570 Actinic keratosis: Secondary | ICD-10-CM

## 2019-12-06 DIAGNOSIS — C44320 Squamous cell carcinoma of skin of unspecified parts of face: Secondary | ICD-10-CM

## 2019-12-06 NOTE — Progress Notes (Signed)
   Follow-Up Visit   Subjective  [name] is a 79 y.o. female who presents for the following: Actinic Keratosis (bx proven of the L sup shoulder - patient here today for treatment) and recheck AK's (previously treated with LN2 - L nasal sidewall, L lat cheek x 2. Resolved per patient.). Hx of BCC - patient is scheduled for Mohs on 02/29/20, but she is on their cancellation list so it may be sooner.   The following portions of the chart were reviewed this encounter and updated as appropriate: Tobacco  Allergies  Meds  Problems  Med Hx  Surg Hx  Fam Hx     Review of Systems: No other skin or systemic complaints.  Objective  Well appearing patient in no apparent distress; mood and affect are within normal limits.  A focused examination was performed including face, scalp, ears, neck, arms and left shoulder. Relevant physical exam findings are noted in the Assessment and Plan.  Objective  R medial cheek x 1, L sup shoulder x 1(bx proven): Erythematous thin papules/macules with gritty scale.   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    Destruction of lesion - R medial cheek x 1, L sup shoulder x 1(bx proven)  Destruction method: cryotherapy   Informed consent: discussed and consent obtained   Lesion destroyed using liquid nitrogen: Yes   Cryotherapy cycles:  2 Outcome: patient tolerated procedure well with no complications   Post-procedure details: wound care instructions given    History of actinic keratosis L ant nasal sidewall, L lat cheek x 2  Resolved today.  Recommend daily broad spectrum sunscreen SPF 30+ to sun-exposed  areas, reapply every 2 hours as needed. Call for new or changing lesions.   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    Actinic Damage - diffuse scaly erythematous macules with underlying dyspigmentation - Recommend daily broad spectrum sunscreen SPF 30+ to sun-exposed areas, reapply every 2 hours as needed.  - Call for new or changing lesions.   Return in about 6 months (around 06/06/2020) for TBSE.   Luther Redo, CMA am acting as scribe for Forest Gleason, MD .  Documentation: I have reviewed the above documentation for accuracy and completeness, and I agree with the above.  Forest Gleason, MD

## 2019-12-06 NOTE — Patient Instructions (Addendum)
Recommend Nicotinamide 500mg  twice per day to lower risk of non-melanoma skin cancer by approximately 25%.   Recommend daily broad spectrum sunscreen SPF 30+ to sun-exposed areas, reapply every 2 hours as needed. Call for new or changing lesions.

## 2019-12-29 ENCOUNTER — Ambulatory Visit: Payer: Medicare HMO | Admitting: Dermatology

## 2020-05-23 ENCOUNTER — Other Ambulatory Visit: Payer: Self-pay | Admitting: Internal Medicine

## 2020-05-23 DIAGNOSIS — J8410 Pulmonary fibrosis, unspecified: Secondary | ICD-10-CM

## 2020-05-24 ENCOUNTER — Ambulatory Visit: Payer: Medicare HMO | Admitting: Dermatology

## 2020-05-24 ENCOUNTER — Encounter: Payer: Self-pay | Admitting: Dermatology

## 2020-05-24 ENCOUNTER — Other Ambulatory Visit: Payer: Self-pay

## 2020-05-24 DIAGNOSIS — L570 Actinic keratosis: Secondary | ICD-10-CM

## 2020-05-24 DIAGNOSIS — L905 Scar conditions and fibrosis of skin: Secondary | ICD-10-CM

## 2020-05-24 DIAGNOSIS — L578 Other skin changes due to chronic exposure to nonionizing radiation: Secondary | ICD-10-CM | POA: Diagnosis not present

## 2020-05-24 DIAGNOSIS — L821 Other seborrheic keratosis: Secondary | ICD-10-CM | POA: Diagnosis not present

## 2020-05-24 NOTE — Patient Instructions (Signed)

## 2020-05-24 NOTE — Progress Notes (Signed)
   Follow-Up Visit   Subjective  [name] is a 79 y.o. female who presents for the following: Area of concern.  Patient presents today for evaluation of a few areas of concern on her R. Neck, B/L brows, and on R. Cheek. Patient h/o SCCis  Left Cheek, s/p Mohs with Dr. Lacinda Axon 02/29/20. She has a dark area at the Mohs site she would like checked.  The following portions of the chart were reviewed this encounter and updated as appropriate:  Tobacco  Allergies  Meds  Problems  Med Hx  Surg Hx  Fam Hx      Review of Systems:  No other skin or systemic complaints except as noted in HPI or Assessment and Plan.  Objective  Well appearing patient in no apparent distress; mood and affect are within normal limits.  A focused examination was performed including head, including the scalp, face, neck, nose, ears, eyelids, and lips, chest, bilateral hands, bilateral arms. Relevant physical exam findings are noted in the Assessment and Plan.  Objective  Left Chest, Nasal Supra tip, Right  Medial Eyebrow, Right  Mid Eyebrow, Right Cheek: papules/macules with thick gritty scale.   Objective  Left Zygomatic Area: scar   Assessment & Plan    AK (actinic keratosis) (5) Left Chest; Right  Medial Eyebrow; Right  Mid Eyebrow; Nasal Supra tip; Right Cheek  Hypertrophic  Cryotherapy today Prior to procedure, discussed risks of blister formation, small wound, skin dyspigmentation, or rare scar following cryotherapy.    Destruction of lesion - Left Chest, Nasal Supra tip, Right  Medial Eyebrow, Right  Mid Eyebrow, Right Cheek  Destruction method: cryotherapy   Informed consent: discussed and consent obtained   Lesion destroyed using liquid nitrogen: Yes   Outcome: patient tolerated procedure well with no complications   Post-procedure details: wound care instructions given    Scar conditions and fibrosis of skin Left Zygomatic Area  Due to Mohs surgery.  Benign-appearing.   Observation.  No evidence of spitting suture. Dark area is secondary to shadow.   Call clinic for new or changing lesions.  Recommend daily use of broad spectrum spf 30+ sunscreen to sun-exposed areas.     Actinic Damage - diffuse scaly erythematous macules with underlying dyspigmentation - Recommend daily broad spectrum sunscreen SPF 30+ to sun-exposed areas, reapply every 2 hours as needed.  - Call for new or changing lesions.  Seborrheic Keratoses - Stuck-on, waxy, tan-brown papules and plaques  - Discussed benign etiology and prognosis. - Observe - Call for any changes   Return for TBSE and AK recheck in 1-2 months.  IDonzetta Kohut, CMA, am acting as scribe for Forest Gleason, MD .  Documentation: I have reviewed the above documentation for accuracy and completeness, and I agree with the above.  Forest Gleason, MD

## 2020-05-29 ENCOUNTER — Other Ambulatory Visit (HOSPITAL_COMMUNITY): Payer: Self-pay | Admitting: Gastroenterology

## 2020-05-29 ENCOUNTER — Other Ambulatory Visit: Payer: Self-pay | Admitting: Gastroenterology

## 2020-05-29 DIAGNOSIS — Z87738 Personal history of other specified (corrected) congenital malformations of digestive system: Secondary | ICD-10-CM

## 2020-05-29 DIAGNOSIS — R1314 Dysphagia, pharyngoesophageal phase: Secondary | ICD-10-CM

## 2020-05-30 ENCOUNTER — Ambulatory Visit
Admission: RE | Admit: 2020-05-30 | Discharge: 2020-05-30 | Disposition: A | Discharge status: Home / Self Care | Payer: Medicare HMO | Source: Ambulatory Visit | Attending: Internal Medicine | Admitting: Internal Medicine

## 2020-05-30 ENCOUNTER — Other Ambulatory Visit: Payer: Self-pay

## 2020-05-30 DIAGNOSIS — J8410 Pulmonary fibrosis, unspecified: Secondary | ICD-10-CM | POA: Diagnosis not present

## 2020-06-02 ENCOUNTER — Ambulatory Visit: Payer: Medicare HMO

## 2020-06-15 ENCOUNTER — Other Ambulatory Visit: Payer: Self-pay

## 2020-06-15 ENCOUNTER — Ambulatory Visit: Payer: Medicare HMO | Admitting: Dermatology

## 2020-06-15 ENCOUNTER — Encounter: Payer: Self-pay | Admitting: Dermatology

## 2020-06-15 DIAGNOSIS — Z872 Personal history of diseases of the skin and subcutaneous tissue: Secondary | ICD-10-CM

## 2020-06-15 DIAGNOSIS — D489 Neoplasm of uncertain behavior, unspecified: Secondary | ICD-10-CM

## 2020-06-15 DIAGNOSIS — L814 Other melanin hyperpigmentation: Secondary | ICD-10-CM | POA: Diagnosis not present

## 2020-06-15 DIAGNOSIS — D229 Melanocytic nevi, unspecified: Secondary | ICD-10-CM

## 2020-06-15 DIAGNOSIS — D485 Neoplasm of uncertain behavior of skin: Secondary | ICD-10-CM | POA: Diagnosis not present

## 2020-06-15 DIAGNOSIS — L821 Other seborrheic keratosis: Secondary | ICD-10-CM

## 2020-06-15 DIAGNOSIS — Z1283 Encounter for screening for malignant neoplasm of skin: Secondary | ICD-10-CM | POA: Diagnosis not present

## 2020-06-15 DIAGNOSIS — L578 Other skin changes due to chronic exposure to nonionizing radiation: Secondary | ICD-10-CM

## 2020-06-15 DIAGNOSIS — D1800 Hemangioma unspecified site: Secondary | ICD-10-CM

## 2020-06-15 DIAGNOSIS — Z86007 Personal history of in-situ neoplasm of skin: Secondary | ICD-10-CM

## 2020-06-15 HISTORY — DX: Personal history of diseases of the skin and subcutaneous tissue: Z87.2

## 2020-06-15 NOTE — Patient Instructions (Addendum)

## 2020-06-15 NOTE — Progress Notes (Signed)
   Follow-Up Visit   Subjective  [name] is a 79 y.o. female who presents for the following: FBSE.  Patient here for full body skin exam and skin cancer screening. Pateint has no concerns today, and a h/o SCCis L cheek 11/2019 tx'd with Mohs  The following portions of the chart were reviewed this encounter and updated as appropriate:  Tobacco  Allergies  Meds  Problems  Med Hx  Surg Hx  Fam Hx      Review of Systems:  No other skin or systemic complaints except as noted in HPI or Assessment and Plan.  Objective  Well appearing patient in no apparent distress; mood and affect are within normal limits.  A full examination was performed including scalp, head, eyes, ears, nose, lips, neck, chest, axillae, abdomen, back, buttocks, bilateral upper extremities, bilateral lower extremities, hands, feet, fingers, toes, fingernails, and toenails. All findings within normal limits unless otherwise noted below.  Objective  Left Chest: 1.2 cm thin scaly pink plaque   Assessment & Plan  Neoplasm of uncertain behavior Left Chest  Skin / nail biopsy Type of biopsy: tangential   Informed consent: discussed and consent obtained   Timeout: patient name, date of birth, surgical site, and procedure verified   Procedure prep:  Patient was prepped and draped in usual sterile fashion Prep type:  Isopropyl alcohol Anesthesia: the lesion was anesthetized in a standard fashion   Anesthetic:  1% lidocaine w/ epinephrine 1-100,000 buffered w/ 8.4% NaHCO3 Hemostasis achieved with: suture and aluminum chloride   Outcome: patient tolerated procedure well   Post-procedure details: sterile dressing applied and wound care instructions given   Dressing type: petrolatum and pressure dressing    Specimen 1 - Surgical pathology Differential Diagnosis: R/O BCC vs other Check Margins: No 1.2 cm thin scaly pink plaque  R/O BCC   Lentigines - Scattered tan macules - Discussed due to sun  exposure - Benign, observe - Call for any changes  Seborrheic Keratoses - Stuck-on, waxy, tan-brown papules and plaques  - Discussed benign etiology and prognosis. - Observe - Call for any changes  Melanocytic Nevi - Tan-brown and/or pink-flesh-colored symmetric macules and papules - Benign appearing on exam today - Observation - Call clinic for new or changing moles - Recommend daily use of broad spectrum spf 30+ sunscreen to sun-exposed areas.   Hemangiomas - Red papules - Discussed benign nature - Observe - Call for any changes  Actinic Damage - diffuse scaly erythematous macules with underlying dyspigmentation - Recommend daily broad spectrum sunscreen SPF 30+ to sun-exposed areas, reapply every 2 hours as needed.  - Call for new or changing lesions.  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  - Call if any new or changing lesions are noted between office visit  Skin cancer screening performed today.   Return in about 1 year (around 06/15/2021) for TBSE.  I, Donzetta Kohut, CMA, am acting as scribe for Forest Gleason, MD .  Documentation: I have reviewed the above documentation for accuracy and completeness, and I agree with the above.  Forest Gleason, MD

## 2020-06-21 NOTE — Progress Notes (Signed)
Skin , left chest ACTINIC KERATOSIS, SEE DESCRIPTION --> Plan LN2 in clinic in next 2 months Microscopic Description  There is slight to moderate squamous atypia in the epidermis with underlying solar elastosis. There is focal moderate to severe atypia with areas that border on carcinoma in situ, but these changes are not fully developed.  MAs please call. Thank you!

## 2020-06-22 ENCOUNTER — Telehealth: Payer: Self-pay

## 2020-06-22 NOTE — Telephone Encounter (Signed)
-----   Message from [place], MD sent at 06/21/2020  5:30 PM EDT ----- Skin , left chest ACTINIC KERATOSIS, SEE DESCRIPTION --> Plan LN2 in clinic in next 2 months Microscopic Description  There is slight to moderate squamous atypia in the epidermis with underlying solar elastosis. There is focal moderate to severe atypia with areas that border on carcinoma in situ, but these changes are not fully developed.  MAs please call. Thank you!

## 2020-06-22 NOTE — Telephone Encounter (Signed)
Patient advised of BX results and scheduled for 2 mo follow up.

## 2020-06-27 ENCOUNTER — Encounter: Payer: Self-pay | Admitting: Dermatology

## 2020-07-10 ENCOUNTER — Other Ambulatory Visit: Payer: Self-pay

## 2020-07-10 ENCOUNTER — Other Ambulatory Visit
Admission: RE | Admit: 2020-07-10 | Discharge: 2020-07-10 | Disposition: A | Discharge status: Home / Self Care | Payer: Medicare HMO | Source: Ambulatory Visit | Attending: Internal Medicine | Admitting: Internal Medicine

## 2020-07-10 DIAGNOSIS — Z01812 Encounter for preprocedural laboratory examination: Secondary | ICD-10-CM | POA: Insufficient documentation

## 2020-07-10 DIAGNOSIS — Z20822 Contact with and (suspected) exposure to covid-19: Secondary | ICD-10-CM | POA: Diagnosis not present

## 2020-07-10 LAB — SARS CORONAVIRUS 2 (TAT 6-24 HRS): SARS Coronavirus 2: NEGATIVE

## 2020-07-11 ENCOUNTER — Encounter: Payer: Self-pay | Admitting: Internal Medicine

## 2020-07-12 ENCOUNTER — Other Ambulatory Visit: Payer: Self-pay

## 2020-07-12 ENCOUNTER — Ambulatory Visit: Payer: Medicare HMO | Admitting: Anesthesiology

## 2020-07-12 ENCOUNTER — Ambulatory Visit
Admission: RE | Admit: 2020-07-12 | Discharge: 2020-07-12 | Disposition: A | Discharge status: Home / Self Care | Payer: Medicare HMO | Attending: Internal Medicine | Admitting: Internal Medicine

## 2020-07-12 ENCOUNTER — Encounter
Admission: RE | Disposition: A | Discharge status: Home / Self Care | Payer: Self-pay | Source: Home / Self Care | Attending: Internal Medicine

## 2020-07-12 DIAGNOSIS — K219 Gastro-esophageal reflux disease without esophagitis: Secondary | ICD-10-CM | POA: Diagnosis not present

## 2020-07-12 DIAGNOSIS — R1313 Dysphagia, pharyngeal phase: Secondary | ICD-10-CM | POA: Diagnosis present

## 2020-07-12 DIAGNOSIS — Z888 Allergy status to other drugs, medicaments and biological substances status: Secondary | ICD-10-CM | POA: Insufficient documentation

## 2020-07-12 DIAGNOSIS — Z79899 Other long term (current) drug therapy: Secondary | ICD-10-CM | POA: Diagnosis not present

## 2020-07-12 DIAGNOSIS — E785 Hyperlipidemia, unspecified: Secondary | ICD-10-CM | POA: Diagnosis not present

## 2020-07-12 DIAGNOSIS — K449 Diaphragmatic hernia without obstruction or gangrene: Secondary | ICD-10-CM | POA: Insufficient documentation

## 2020-07-12 DIAGNOSIS — K222 Esophageal obstruction: Secondary | ICD-10-CM | POA: Insufficient documentation

## 2020-07-12 DIAGNOSIS — Z7982 Long term (current) use of aspirin: Secondary | ICD-10-CM | POA: Insufficient documentation

## 2020-07-12 HISTORY — DX: Other complications of anesthesia, initial encounter: T88.59XA

## 2020-07-12 HISTORY — DX: Gastro-esophageal reflux disease without esophagitis: K21.9

## 2020-07-12 HISTORY — PX: ESOPHAGOGASTRODUODENOSCOPY (EGD) WITH PROPOFOL: SHX5813

## 2020-07-12 SURGERY — ESOPHAGOGASTRODUODENOSCOPY (EGD) WITH PROPOFOL
Anesthesia: General

## 2020-07-12 MED ORDER — PROPOFOL 10 MG/ML IV BOLUS
INTRAVENOUS | Status: DC | PRN
  Administered 2020-07-12 (×2): 40 mg via INTRAVENOUS

## 2020-07-12 MED ORDER — PROPOFOL 10 MG/ML IV BOLUS
INTRAVENOUS | Status: AC
  Filled 2020-07-12: qty 80

## 2020-07-12 MED ORDER — SODIUM CHLORIDE 0.9 % IV SOLN
INTRAVENOUS | Status: DC
  Administered 2020-07-12: 20 mL/h via INTRAVENOUS

## 2020-07-12 MED ORDER — LIDOCAINE HCL (CARDIAC) PF 100 MG/5ML IV SOSY
PREFILLED_SYRINGE | INTRAVENOUS | Status: DC | PRN
  Administered 2020-07-12: 60 mg via INTRAVENOUS

## 2020-07-12 NOTE — H&P (Signed)
  Outpatient short stay form Pre-procedure 07/12/2020 10:28 AM Gia Lusher K. Alice Reichert, M.D.  Primary Physician:  Emily Filbert, M.D.  Reason for visit:  Dysphagia   History of present illness:  79 y/o patient with intermittent dysphagia to solids over the past 6 months. Has hx of esophageal web. Has GERD symptoms improving after adding famotidine to omeprazole therapy. No hemetemesis, abdominal pain, melena.     Current Facility-Administered Medications:  .  0.9 %  sodium chloride infusion, , Intravenous, Continuous, [place], Benay Pike, MD, Last Rate: 20 mL/hr at 07/12/20 1021, Continued from Pre-op at 07/12/20 1021  Medications Prior to Admission  Medication Sig Dispense Refill Last Dose  . ALPRAZolam (XANAX) 0.25 MG tablet Take 0.25 mg by mouth at bedtime as needed for anxiety.   Past Week at Unknown time  . aspirin 81 MG EC tablet Take 81 mg by mouth daily.     Past Week at Unknown time  . cholecalciferol (VITAMIN D) 400 UNITS TABS tablet Take 2,000 Units by mouth.   Past Week at Unknown time  . Denosumab (PROLIA [place]) Inject into the skin. Semi annual injection   Past Week at Unknown time  . famotidine (PEPCID) 40 MG tablet Take by mouth.   Past Week at Unknown time  . Ferrous Fumarate-Vitamin C 65-125 MG TABS Take by mouth.   Past Week at Unknown time  . lovastatin (MEVACOR) 20 MG tablet Take 20 mg by mouth daily.   Past Week at Unknown time  . montelukast (SINGULAIR) 10 MG tablet TAKE ONE TABLET BY MOUTH ONCE NIGHTLY   Past Week at Unknown time  . Multiple Vitamin (MULTIVITAMIN) tablet Take 1 tablet by mouth daily.   Past Week at Unknown time  . omeprazole (PRILOSEC) 40 MG capsule Take 40 mg by mouth daily.   Past Week at Unknown time  . Tavaborole 5 % SOLN Apply topically daily.   Past Week at Unknown time  . pantoprazole (PROTONIX) 40 MG tablet Take 40 mg by mouth daily. (Patient not taking: Reported on 06/15/2020)     . pravastatin (PRAVACHOL) 40 MG tablet Take 40 mg by mouth daily.         Allergies  Allergen Reactions  . Bisphosphonates   . Boniva [Ibandronic Acid]   . Fosamax [Alendronate Sodium]   . Lipitor [Atorvastatin]      Past Medical History:  Diagnosis Date  . Actinic keratosis 11/10/2019   Left superior shoulder. Bx proven.  . Complication of anesthesia    ponv  . GERD (gastroesophageal reflux disease)   . History of osteoporosis   . Hyperlipidemia   . 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 SCCis    Review of systems:  Otherwise negative.    Physical Exam  Gen: Alert, oriented. Appears stated age.  HEENT: [place]/AT. PERRLA. Lungs: CTA, no wheezes. CV: RR nl S1, S2. Abd: soft, benign, no masses. BS+ Ext: No edema. Pulses 2+    Planned procedures: Proceed with EGD. The patient understands the nature of the planned procedure, indications, risks, alternatives and potential complications including but not limited to bleeding, infection, perforation, damage to internal organs and possible oversedation/side effects from anesthesia. The patient agrees and gives consent to proceed.  Please refer to procedure notes for findings, recommendations and patient disposition/instructions.     Jeymi Hepp K. Alice Reichert, M.D. Gastroenterology 07/12/2020  10:28 AM

## 2020-07-12 NOTE — Anesthesia Postprocedure Evaluation (Signed)
Anesthesia Post Note  Patient: [name]  Procedure(s) Performed: ESOPHAGOGASTRODUODENOSCOPY (EGD) WITH PROPOFOL (N/A )  Patient location during evaluation: PACU Anesthesia Type: General Level of consciousness: awake and alert and oriented Pain management: pain level controlled Vital Signs Assessment: post-procedure vital signs reviewed and stable Respiratory status: spontaneous breathing, nonlabored ventilation and respiratory function stable Cardiovascular status: blood pressure returned to baseline and stable Postop Assessment: no signs of nausea or vomiting Anesthetic complications: no   No complications documented.   Last Vitals:  Vitals:   07/12/20 1041 07/12/20 1051  BP: 117/68 (!) 127/93  Pulse:    Resp:    Temp: (!) 36.3 C   SpO2:      Last Pain:  Vitals:   07/12/20 1111  TempSrc:   PainSc: 0-No pain                 Cydney Alvarenga

## 2020-07-12 NOTE — Transfer of Care (Signed)
Immediate Anesthesia Transfer of Care Note  Patient: [name]  Procedure(s) Performed: ESOPHAGOGASTRODUODENOSCOPY (EGD) WITH PROPOFOL (N/A )  Patient Location: PACU and Endoscopy Unit  Anesthesia Type:General  Level of Consciousness: awake, drowsy and patient cooperative  Airway & Oxygen Therapy: Patient Spontanous Breathing  Post-op Assessment: Report given to RN and Post -op Vital signs reviewed and stable  Post vital signs: Reviewed and stable  Last Vitals:  Vitals Value Taken Time  BP 141/75 07/12/20 1100  Temp 36.3 C 07/12/20 1041  Pulse 77 07/12/20 1109  Resp 18 07/12/20 1109  SpO2 98 % 07/12/20 1109  Vitals shown include unvalidated device data.  Last Pain:  Vitals:   07/12/20 1101  TempSrc:   PainSc: 0-No pain         Complications: No complications documented.

## 2020-07-12 NOTE — Op Note (Signed)
[facility] Gastroenterology Patient Name: [name] Procedure Date: 07/12/2020 10:09 AM MRN: [id number] Account #: [id number] Date of Birth: Sep 04, 1940 Admit Type: Outpatient Age: 79 Room: [facility] ENDO ROOM 2 Gender: Female Note Status: Finalized Procedure:             Upper GI endoscopy Indications:           Pharyngeal phase dysphagia Providers:             Benay Pike. Valerian Jewel MD, MD Medicines:             Propofol per Anesthesia Complications:         No immediate complications. Procedure:             Pre-Anesthesia Assessment:                        - The risks and benefits of the procedure and the                         sedation options and risks were discussed with the                         patient. All questions were answered and informed                         consent was obtained.                        - Patient identification and proposed procedure were                         verified prior to the procedure by the nurse. The                         procedure was verified in the procedure room.                        - ASA Grade Assessment: III - A patient with severe                         systemic disease.                        - After reviewing the risks and benefits, the patient                         was deemed in satisfactory condition to undergo the                         procedure.                        After obtaining informed consent, the endoscope was                         passed under direct vision. Throughout the procedure,                         the patient's blood pressure, pulse, and oxygen  saturations were monitored continuously. The Endoscope                         was introduced through the mouth, and advanced to the                         third part of duodenum. The upper GI endoscopy was                         accomplished without difficulty. The patient tolerated                          the procedure well. Findings:      One benign-appearing, intrinsic mild stenosis was found at the       gastroesophageal junction. This stenosis measured 1.5 cm (inner       diameter) x less than one cm (in length). The stenosis was traversed.       The scope was withdrawn. Dilation was attempted, but the lesion was not       amenable to treatment with a Maloney dilator because the dilator could       not be passed at 80 Fr. The scope was withdrawn. Dilation was performed       with a Maloney dilator with no resistance at 74 Fr.      A 1 cm hiatal hernia was present.      The examined duodenum was normal.      The exam was otherwise without abnormality. Impression:            - Benign-appearing esophageal stenosis. Unable to                         dilate. Dilated.                        - 1 cm hiatal hernia.                        - Normal examined duodenum.                        - The examination was otherwise normal.                        - No specimens collected. Recommendation:        - Patient has a contact number available for                         emergencies. The signs and symptoms of potential                         delayed complications were discussed with the patient.                         Return to normal activities tomorrow. Written                         discharge instructions were provided to the patient.                        - Resume previous  diet.                        - Continue present medications.                        - Return to physician assistant in 3 months.                        - The findings and recommendations were discussed with                         the patient. Procedure Code(s):     --- Professional ---                        [phone], Esophagogastroduodenoscopy, flexible,                         transoral; diagnostic, including collection of                         specimen(s) by brushing or washing, when performed                          (separate procedure)                        43450, Dilation of esophagus, by unguided sound or                         bougie, single or multiple passes Diagnosis Code(s):     --- Professional ---                        R13.13, Dysphagia, pharyngeal phase                        K44.9, Diaphragmatic hernia without obstruction or                         gangrene                        K22.2, Esophageal obstruction CPT copyright 2019 American Medical Association. All rights reserved. The codes documented in this report are preliminary and upon coder review may  be revised to meet current compliance requirements. Efrain Sella MD, MD 07/12/2020 10:41:07 AM This report has been signed electronically. Number of Addenda: 0 Note Initiated On: 07/12/2020 10:09 AM Estimated Blood Loss:  Estimated blood loss: none.      [facility]

## 2020-07-12 NOTE — Anesthesia Preprocedure Evaluation (Signed)
Anesthesia Evaluation  Patient identified by MRN, date of birth, ID band Patient awake    Reviewed: Allergy & Precautions, NPO status , Patient's Chart, lab work & pertinent test results  History of Anesthesia Complications (+) PONV and history of anesthetic complications  Airway Mallampati: II  TM Distance: >3 FB Neck ROM: Full    Dental no notable dental hx.    Pulmonary neg pulmonary ROS, neg sleep apnea, neg COPD,    breath sounds clear to auscultation- rhonchi (-) wheezing      Cardiovascular (-) hypertension(-) CAD, (-) Past MI, (-) Cardiac Stents and (-) CABG  Rhythm:Regular Rate:Normal - Systolic murmurs and - Diastolic murmurs    Neuro/Psych neg Seizures negative neurological ROS  negative psych ROS   GI/Hepatic Neg liver ROS, GERD  ,  Endo/Other  negative endocrine ROSneg diabetes  Renal/GU negative Renal ROS     Musculoskeletal negative musculoskeletal ROS (+)   Abdominal (+) - obese,   Peds  Hematology negative hematology ROS (+)   Anesthesia Other Findings Past Medical History: 11/10/2019: Actinic keratosis     Comment:  Left superior shoulder. Bx proven. No date: Complication of anesthesia     Comment:  ponv No date: GERD (gastroesophageal reflux disease) No date: History of osteoporosis No date: Hyperlipidemia 11/10/2019: Squamous cell carcinoma of skin     Comment:  Left cheek. SCCis   Reproductive/Obstetrics                             Anesthesia Physical Anesthesia Plan  ASA: II  Anesthesia Plan: General   Post-op Pain Management:    Induction: Intravenous  PONV Risk Score and Plan: 3 and Propofol infusion  Airway Management Planned: Natural Airway  Additional Equipment:   Intra-op Plan:   Post-operative Plan:   Informed Consent: I have reviewed the patients History and Physical, chart, labs and discussed the procedure including the risks, benefits  and alternatives for the proposed anesthesia with the patient or authorized representative who has indicated his/her understanding and acceptance.     Dental advisory given  Plan Discussed with: CRNA and Anesthesiologist  Anesthesia Plan Comments:         Anesthesia Quick Evaluation

## 2020-07-12 NOTE — Interval H&P Note (Signed)
History and Physical Interval Note:  07/12/2020 10:29 AM  [name]  has presented today for surgery, with the diagnosis of ESOPHAGEAL DYSPHAGIA ESOPHAGEAL WEB GERD.  The various methods of treatment have been discussed with the patient and family. After consideration of risks, benefits and other options for treatment, the patient has consented to  Procedure(s): ESOPHAGOGASTRODUODENOSCOPY (EGD) WITH PROPOFOL (N/A) as a surgical intervention.  The patient's history has been reviewed, patient examined, no change in status, stable for surgery.  I have reviewed the patient's chart and labs.  Questions were answered to the patient's satisfaction.     [place], [place]

## 2020-07-13 ENCOUNTER — Encounter: Payer: Self-pay | Admitting: Internal Medicine

## 2020-08-30 ENCOUNTER — Ambulatory Visit: Payer: Medicare HMO | Admitting: Dermatology

## 2020-08-31 ENCOUNTER — Other Ambulatory Visit: Payer: Self-pay | Admitting: Internal Medicine

## 2020-08-31 DIAGNOSIS — Z1231 Encounter for screening mammogram for malignant neoplasm of breast: Secondary | ICD-10-CM

## 2020-09-05 ENCOUNTER — Encounter: Payer: Self-pay | Admitting: Dermatology

## 2020-09-05 ENCOUNTER — Ambulatory Visit: Payer: Medicare HMO | Admitting: Dermatology

## 2020-09-05 ENCOUNTER — Other Ambulatory Visit: Payer: Self-pay

## 2020-09-05 DIAGNOSIS — Z86007 Personal history of in-situ neoplasm of skin: Secondary | ICD-10-CM | POA: Diagnosis not present

## 2020-09-05 DIAGNOSIS — L570 Actinic keratosis: Secondary | ICD-10-CM | POA: Diagnosis not present

## 2020-09-05 NOTE — Patient Instructions (Addendum)
5-Fluorouracil/Calcipotriene Patient Education   Actinic keratoses are the dry, red scaly spots on the skin caused by sun damage. A portion of these spots can turn into skin cancer with time, and treating them can help prevent development of skin cancer.   Treatment of these spots requires removal of the defective skin cells. There are various ways to remove actinic keratoses, including freezing with liquid nitrogen, treatment with creams, or treatment with a blue light procedure in the office.   5-fluorouracil cream is a topical cream used to treat actinic keratoses. It works by interfering with the growth of abnormal fast-growing skin cells, such as actinic keratoses. These cells peel off and are replaced by healthy ones.   5-fluorouracil/calcipotriene is a combination of the 5-fluorouracil cream with a vitamin D analog cream called calcipotriene. The calcipotriene alone does not treat actinic keratoses. However, when it is combined with 5-fluorouracil, it helps the 5-fluorouracil treat the actinic keratoses much faster so that the same results can be achieved with a much shorter treatment time.  INSTRUCTIONS FOR 5-FLUOROURACIL/CALCIPOTRIENE CREAM:   5-fluorouracil/calcipotriene cream typically only needs to be used for 4-7 days at right cheek and 7-10 days at left chest. A thin layer should be applied twice a day to the treatment areas recommended by your physician.   If your physician prescribed you separate tubes of 5-fluourouracil and calcipotriene, apply a thin layer of 5-fluorouracil followed by a thin layer of calcipotriene.   Avoid contact with your eyes, nostrils, and mouth. Do not use 5-fluorouracil/calcipotriene cream on infected or open wounds.   You will develop redness, irritation and some crusting at areas where you have pre-cancer damage/actinic keratoses. IF YOU DEVELOP PAIN, BLEEDING, OR SIGNIFICANT CRUSTING, STOP THE TREATMENT EARLY - you have already gotten a good response and  the actinic keratoses should clear up well.  Wash your hands after applying 5-fluorouracil 5% cream on your skin.   A moisturizer or sunscreen with a minimum SPF 30 should be applied each morning.   Once you have finished the treatment, you can apply a thin layer of Vaseline twice a day to irritated areas to soothe and calm the areas more quickly. If you experience significant discomfort, contact your physician.  For some patients it is necessary to repeat the treatment for best results.  SIDE EFFECTS: When using 5-fluorouracil/calcipotriene cream, you may have mild irritation, such as redness, dryness, swelling, or a mild burning sensation. This usually resolves within 2 weeks. The more actinic keratoses you have, the more redness and inflammation you can expect during treatment. Eye irritation has been reported rarely. If this occurs, please let us know.  If you have any trouble using this cream, please call the office. If you have any other questions about this information, please do not hesitate to ask me before you leave the office.  Instructions for [facility] Medications  One or more of your medications was sent to the [facility] mail order compounding pharmacy. You will receive an email from them and can purchase the medicine through that link. It will then be mailed to your home at the address you confirmed. If for any reason you do not receive an email from them, please check your spam folder. If you still do not find the email, please let us know. [facility] phone number is [phone].

## 2020-09-05 NOTE — Progress Notes (Signed)
   Follow-Up Visit   Subjective  [name] is a 80 y.o. female who presents for the following: Follow-up (Patient here today to treat biopsy proven AK at left chest. Patient advises there is a spot at right cheek that was treated back in September with LN2, flaked off but is still there. ).  The following portions of the chart were reviewed this encounter and updated as appropriate:   Tobacco  Allergies  Meds  Problems  Med Hx  Surg Hx  Fam Hx      Review of Systems:  No other skin or systemic complaints except as noted in HPI or Assessment and Plan.  Objective  Well appearing patient in no apparent distress; mood and affect are within normal limits.  A focused examination was performed including face, chest. Relevant physical exam findings are noted in the Assessment and Plan.  Objective  Left chest, right cheek: Well healed biopsy site at left chest Erythematous thin papules/macules with gritty scale at right cheek   Assessment & Plan  AK (actinic keratosis) Left chest, right cheek  Precancerous condition  Biopsy proven at left chest  Patient will use prescription 5-fluorouracil/calcipotriene cream twice daily for 4 - 7 days to affected area at right cheek and for 7 - 10 days at left chest. Patient's husband has a rx from [facility] that she will use. Advised her that it may not be as strong since it was ordered a few months ago.   Reviewed expected reaction and what to watch out for.  Patient provided with a handout reviewing treatment course and side effects and advised to call or message [place] on MyChart with any questions or concerns.   History of Squamous Cell Carcinoma in Situ of the Skin - No evidence of recurrence today at left cheek, 11/2019 - Recommend regular full body skin exams - Recommend daily broad spectrum sunscreen SPF 30+ to sun-exposed areas, reapply every 2 hours as needed.  - Call if any new or changing lesions are noted between office  visits   Return in about 6 weeks (around 10/17/2020) for AK follow up, TBSE in April.  Anise Salvo, RMA, am acting as scribe for Darden Dates, MD .  Documentation: I have reviewed the above documentation for accuracy and completeness, and I agree with the above.  Darden Dates, MD

## 2020-09-07 ENCOUNTER — Ambulatory Visit: Payer: Medicare HMO | Admitting: Dermatology

## 2020-09-26 ENCOUNTER — Encounter: Payer: Self-pay | Admitting: Dermatology

## 2020-10-11 ENCOUNTER — Ambulatory Visit
Admission: RE | Admit: 2020-10-11 | Discharge: 2020-10-11 | Disposition: A | Discharge status: Home / Self Care | Payer: Medicare HMO | Source: Ambulatory Visit | Attending: Internal Medicine | Admitting: Internal Medicine

## 2020-10-11 ENCOUNTER — Other Ambulatory Visit: Payer: Self-pay

## 2020-10-11 DIAGNOSIS — Z1231 Encounter for screening mammogram for malignant neoplasm of breast: Secondary | ICD-10-CM | POA: Diagnosis not present

## 2020-10-19 ENCOUNTER — Ambulatory Visit: Payer: Medicare HMO | Admitting: Dermatology

## 2020-12-21 ENCOUNTER — Other Ambulatory Visit: Payer: Self-pay

## 2020-12-21 ENCOUNTER — Ambulatory Visit: Payer: Medicare HMO | Admitting: Dermatology

## 2020-12-21 DIAGNOSIS — L578 Other skin changes due to chronic exposure to nonionizing radiation: Secondary | ICD-10-CM | POA: Diagnosis not present

## 2020-12-21 DIAGNOSIS — Z1283 Encounter for screening for malignant neoplasm of skin: Secondary | ICD-10-CM

## 2020-12-21 DIAGNOSIS — Z86007 Personal history of in-situ neoplasm of skin: Secondary | ICD-10-CM | POA: Diagnosis not present

## 2020-12-21 DIAGNOSIS — D229 Melanocytic nevi, unspecified: Secondary | ICD-10-CM

## 2020-12-21 DIAGNOSIS — D1800 Hemangioma unspecified site: Secondary | ICD-10-CM

## 2020-12-21 DIAGNOSIS — D485 Neoplasm of uncertain behavior of skin: Secondary | ICD-10-CM

## 2020-12-21 DIAGNOSIS — L814 Other melanin hyperpigmentation: Secondary | ICD-10-CM

## 2020-12-21 DIAGNOSIS — Z872 Personal history of diseases of the skin and subcutaneous tissue: Secondary | ICD-10-CM

## 2020-12-21 DIAGNOSIS — L821 Other seborrheic keratosis: Secondary | ICD-10-CM

## 2020-12-21 DIAGNOSIS — D489 Neoplasm of uncertain behavior, unspecified: Secondary | ICD-10-CM

## 2020-12-21 NOTE — Patient Instructions (Signed)
Biopsy Wound Care Instructions  1. Leave the original bandage on for 24 hours if possible.  If the bandage becomes soaked or soiled before that time, it is OK to remove it and examine the wound.  A small amount of post-operative bleeding is normal.  If excessive bleeding occurs, remove the bandage, place gauze over the site and apply continuous pressure (no peeking) over the area for 30 minutes. If this does not work, please call our clinic as soon as possible or page your doctor if it is after hours.   2. Once a day, cleanse the wound with soap and water. It is fine to shower. If a thick crust develops you may use a Q-tip dipped into dilute hydrogen peroxide (mix 1:1 with water) to dissolve it.  Hydrogen peroxide can slow the healing process, so use it only as needed.    3. After washing, apply petroleum jelly (Vaseline) or an antibiotic ointment if your doctor prescribed one for you, followed by a bandage.    4. For best healing, the wound should be covered with a layer of ointment at all times. If you are not able to keep the area covered with a bandage to hold the ointment in place, this may mean re-applying the ointment several times a day.  Continue this wound care until the wound has healed and is no longer open.   Itching and mild discomfort is normal during the healing process. However, if you develop pain or severe itching, please call our office.   If you have any discomfort, you can take Tylenol (acetaminophen) or ibuprofen as directed on the bottle. (Please do not take these if you have an allergy to them or cannot take them for another reason).  Some redness, tenderness and white or yellow material in the wound is normal healing.  If the area becomes very sore and red, or develops a thick yellow-green material (pus), it may be infected; please notify us.    If you have stitches, return to clinic as directed to have the stitches removed. You will continue wound care for 2-3 days after  the stitches are removed.   Wound healing continues for up to one year following surgery. It is not unusual to experience pain in the scar from time to time during the interval.  If the pain becomes severe or the scar thickens, you should notify the office.    A slight amount of redness in a scar is expected for the first six months.  After six months, the redness will fade and the scar will soften and fade.  The color difference becomes less noticeable with time.  If there are any problems, return for a post-op surgery check at your earliest convenience.  To improve the appearance of the scar, you can use silicone scar gel, cream, or sheets (such as Mederma or Serica) every night for up to one year. These are available over the counter (without a prescription).  Please call our office at [phone] for any questions or concerns.   Melanoma ABCDEs  Melanoma is the most dangerous type of skin cancer, and is the leading cause of death from skin disease.  You are more likely to develop melanoma if you:  Have light-colored skin, light-colored eyes, or red or blond hair  Spend a lot of time in the sun  Tan regularly, either outdoors or in a tanning bed  Have had blistering sunburns, especially during childhood  Have a close family member who has had  a melanoma  Have atypical moles or large birthmarks  Early detection of melanoma is key since treatment is typically straightforward and cure rates are extremely high if we catch it early.   The first sign of melanoma is often a change in a mole or a new dark spot.  The ABCDE system is a way of remembering the signs of melanoma.  A for asymmetry:  The two halves do not match. B for border:  The edges of the growth are irregular. C for color:  A mixture of colors are present instead of an even brown color. D for diameter:  Melanomas are usually (but not always) greater than 44mm - the size of a pencil eraser. E for evolution:  The spot  keeps changing in size, shape, and color.  Please check your skin once per month between visits. You can use a small mirror in front and a large mirror behind you to keep an eye on the back side or your body.   If you see any new or changing lesions before your next follow-up, please call to schedule a visit.  Please continue daily skin protection including broad spectrum sunscreen SPF 30+ to sun-exposed areas, reapplying every 2 hours as needed when you're outdoors.   Staying in the shade or wearing long sleeves, sun glasses (UVA+UVB protection) and wide brim hats (4-inch brim around the entire circumference of the hat) are also recommended for sun protection.   Recommend taking Heliocare sun protection supplement daily in sunny weather for additional sun protection. For maximum protection on the sunniest days, you can take up to 2 capsules of regular Heliocare OR take 1 capsule of Heliocare Ultra. For prolonged exposure (such as a full day in the sun), you can repeat your dose of the supplement 4 hours after your first dose. Heliocare can be purchased at [facility] or at [email or web address].

## 2020-12-21 NOTE — Progress Notes (Signed)
Follow-Up Visit   Subjective  [name] is a 80 y.o. female who presents for the following: 6 month follow up tbse (Patient here today for 6 month tbse. She has history of aks and history of skin cancer. She has a spot on back near left shoulder she would like checked. ).  Patient here for full body skin exam and skin cancer screening.  The following portions of the chart were reviewed this encounter and updated as appropriate:  Tobacco  Allergies  Meds  Problems  Med Hx  Surg Hx  Fam Hx      Objective  Well appearing patient in no apparent distress; mood and affect are within normal limits.  A full examination was performed including scalp, head, eyes, ears, nose, lips, neck, chest, axillae, abdomen, back, buttocks, bilateral upper extremities, bilateral lower extremities, hands, feet, fingers, toes, fingernails, and toenails. All findings within normal limits unless otherwise noted below.  Objective  Left Shoulder - Posterior: 0.4 cm pink papule        Assessment & Plan  Neoplasm of uncertain behavior Left Shoulder - Posterior  Skin / nail biopsy Type of biopsy: tangential   Informed consent: discussed and consent obtained   Timeout: patient name, date of birth, surgical site, and procedure verified   Patient was prepped and draped in usual sterile fashion: Area prepped with isopropyl alcohol. Anesthesia: the lesion was anesthetized in a standard fashion   Anesthetic:  0.5% bupivicaine w/ epinephrine 1-100,000 local infiltration Instrument used: flexible razor blade   Hemostasis achieved with: aluminum chloride   Outcome: patient tolerated procedure well   Post-procedure details: wound care instructions given   Additional details:  Mupirocin and a bandage applied  Specimen 1 - Surgical pathology Differential Diagnosis: r/o scc vs folliculitis   Check Margins: No 0.4 cm pink papule Left posterior shoulder  R/o scc vs folliculitis   Lentigines -  Scattered tan macules - Due to sun exposure - Benign-appering, observe - Recommend daily broad spectrum sunscreen SPF 30+ to sun-exposed areas, reapply every 2 hours as needed. - Call for any changes  Seborrheic Keratoses - Stuck-on, waxy, tan-brown papules and/or plaques  - Benign-appearing - Discussed benign etiology and prognosis. - Observe - Call for any changes  Melanocytic Nevi - Tan-brown and/or pink-flesh-colored symmetric macules and papules - Benign appearing on exam today - Observation - Call clinic for new or changing moles - Recommend daily use of broad spectrum spf 30+ sunscreen to sun-exposed areas.   Hemangiomas - Red papules - Discussed benign nature - Observe - Call for any changes  Actinic Damage - Chronic condition, secondary to cumulative UV/sun exposure - diffuse scaly erythematous macules with underlying dyspigmentation - Recommend daily broad spectrum sunscreen SPF 30+ to sun-exposed areas, reapply every 2 hours as needed.  - Staying in the shade or wearing long sleeves, sun glasses (UVA+UVB protection) and wide brim hats (4-inch brim around the entire circumference of the hat) are also recommended for sun protection.  - Call for new or changing lesions.  History of Squamous Cell Carcinoma in Situ of the Skin - No evidence of recurrence today on left cheek - Recommend regular full body skin exams - Recommend daily broad spectrum sunscreen SPF 30+ to sun-exposed areas, reapply every 2 hours as needed.  - Call if any new or changing lesions are noted between office visits   Skin cancer screening performed today.  Return in about 6 months (around 06/22/2021) for tbse .  I,  Ruthell Rummage, CMA, am acting as scribe for Forest Gleason, MD.  Documentation: I have reviewed the above documentation for accuracy and completeness, and I agree with the above.  Forest Gleason, MD

## 2020-12-25 ENCOUNTER — Encounter: Payer: Self-pay | Admitting: Dermatology

## 2020-12-27 ENCOUNTER — Telehealth: Payer: Self-pay

## 2020-12-27 NOTE — Telephone Encounter (Signed)
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  "bug bite or eczema, no skin cancer seen"  MAs please call. Thank you!

## 2020-12-27 NOTE — Telephone Encounter (Signed)
Patient advised of BX results.  °

## 2020-12-27 NOTE — Telephone Encounter (Signed)
Tried calling patient to discuss lab results. No answer. LMOM for patient to call office.

## 2021-04-06 IMAGING — MG DIGITAL SCREENING BILAT W/ TOMO W/ CAD
6 of 10 series · 6 of 30 positions shown · non-contrast
Comparison: Previous exam(s).

CLINICAL DATA: Screening.

EXAM:
DIGITAL SCREENING BILATERAL MAMMOGRAM WITH TOMO AND CAD

[L MLO synth-2D (1 of 2)]
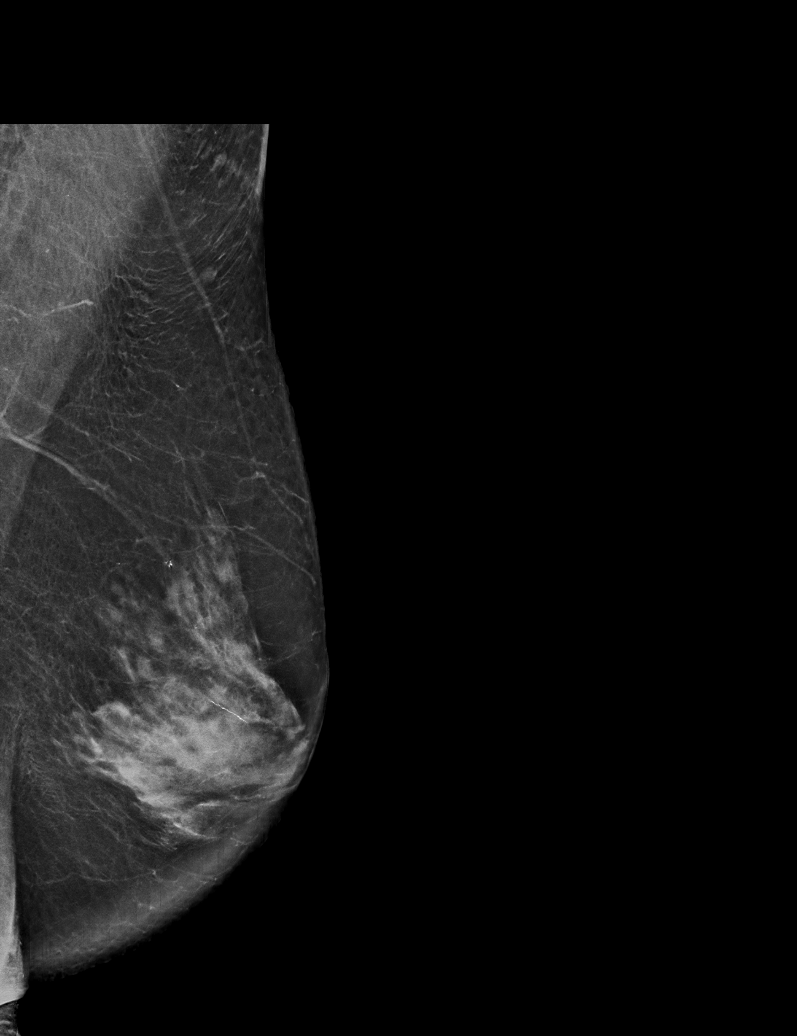

[L MLO synth-2D (2 of 2)]
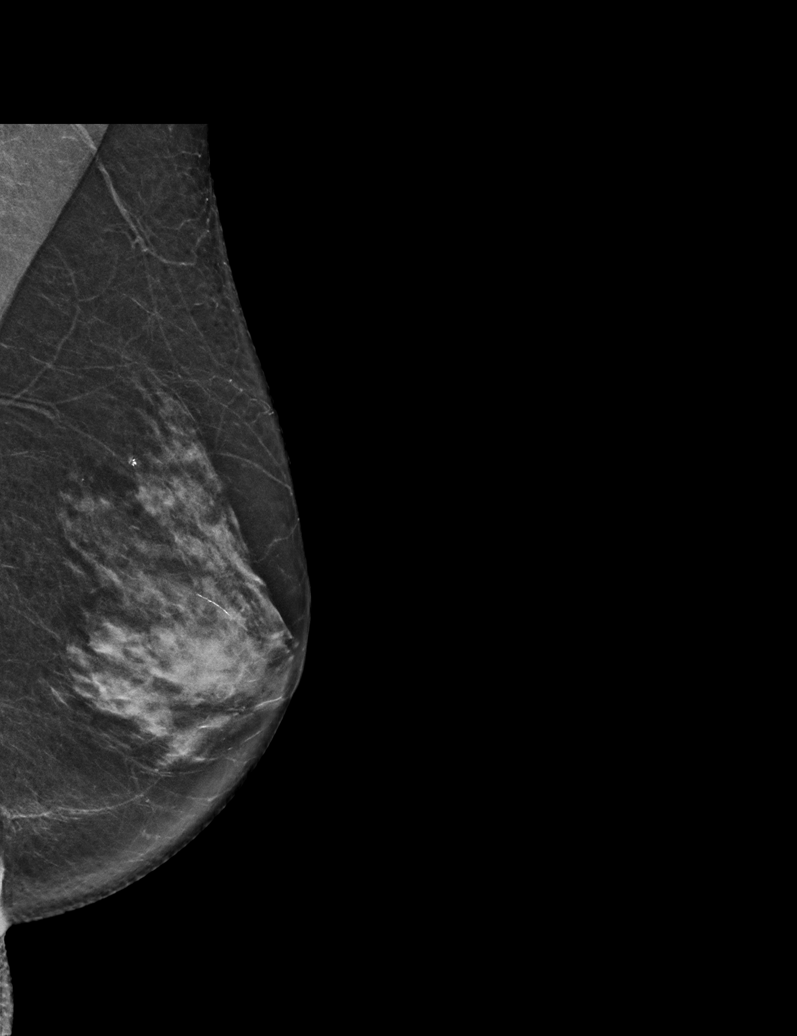

[R CC synth-2D]
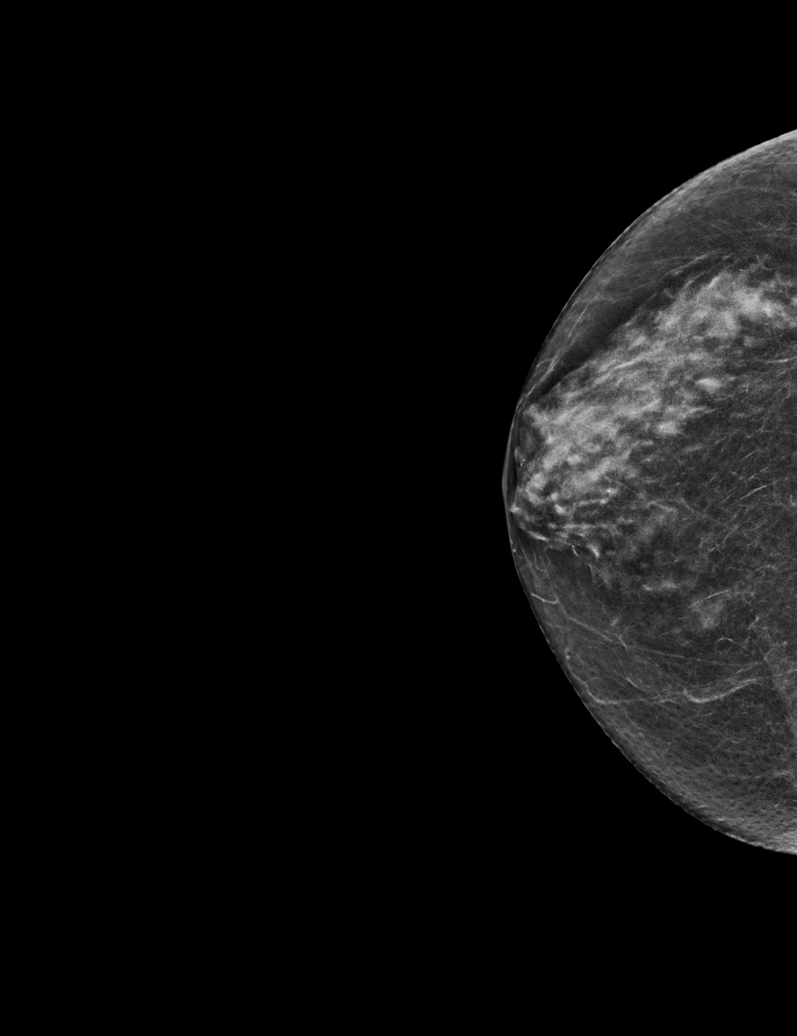

[L CC synth-2D]
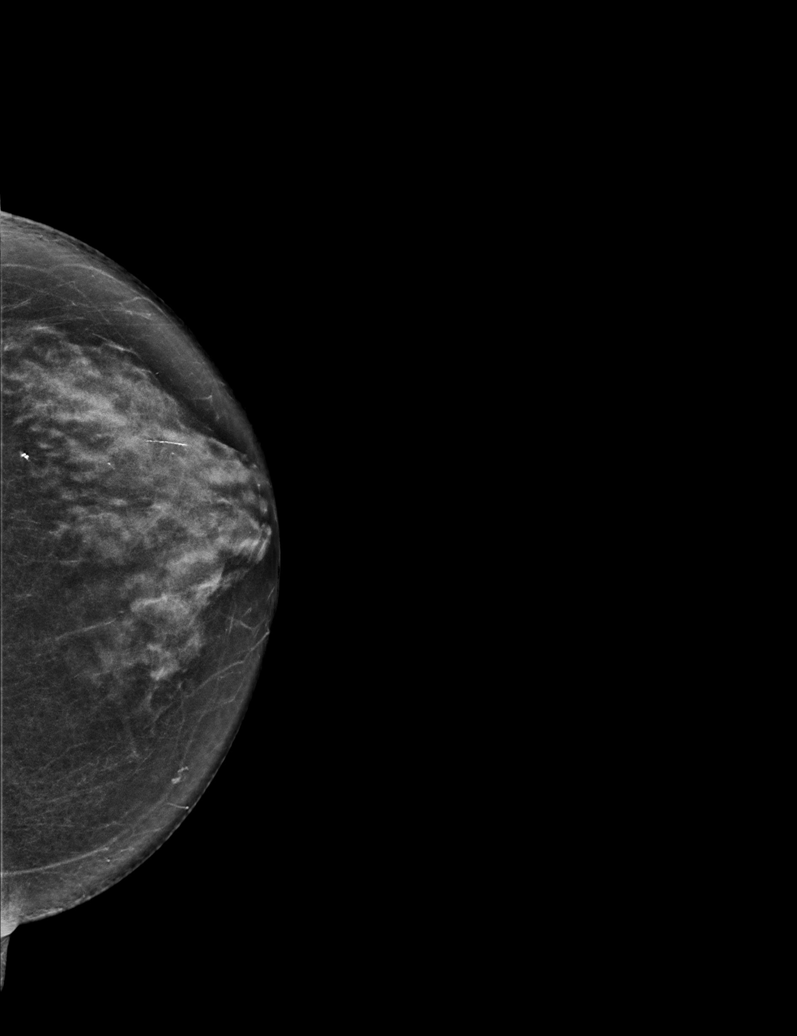

[R MLO synth-2D]
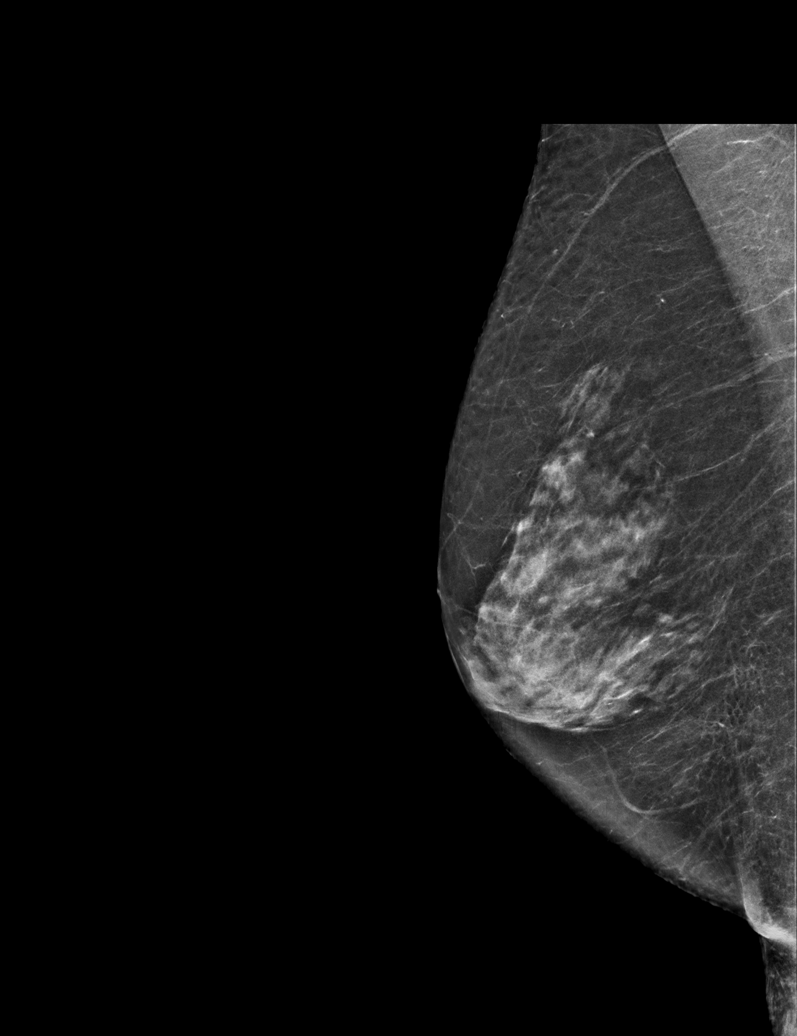

[L MLO tomo · tomo slice 37/73.0]
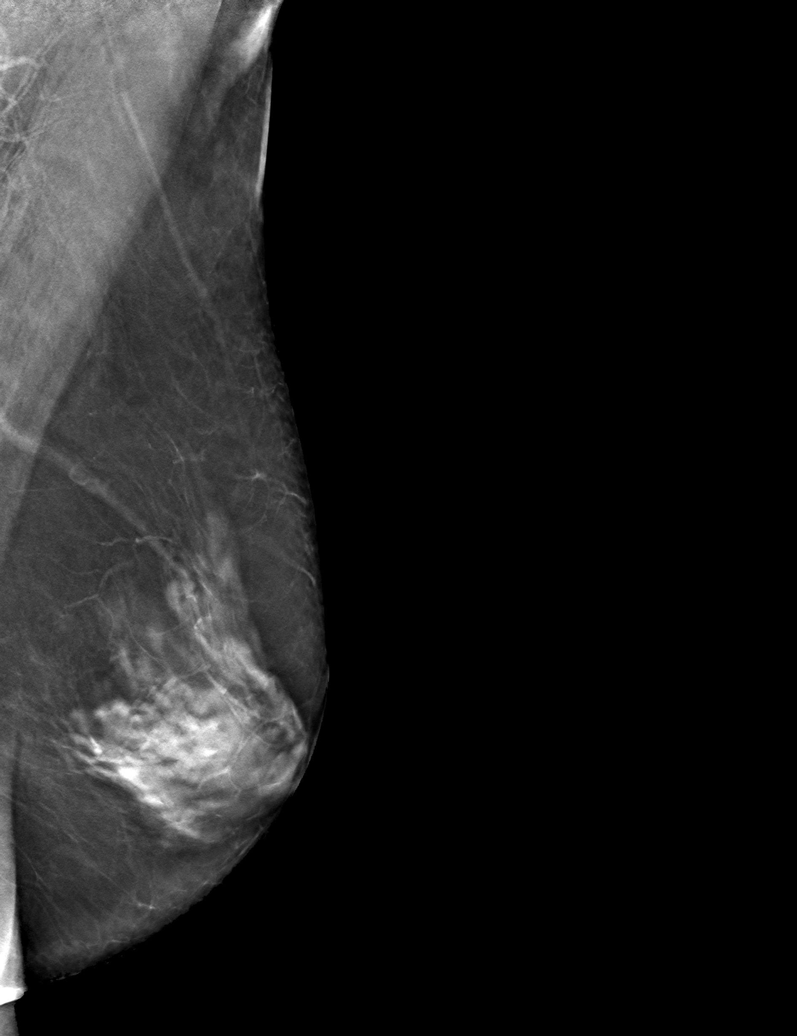

[6 of 30 positions shown; findings below may reference images not displayed]

ACR Breast Density Category d: The breast tissue is extremely dense,
which lowers the sensitivity of mammography
FINDINGS: There are no findings suspicious for malignancy. Images were
processed with CAD.
IMPRESSION: No mammographic evidence of malignancy. A result letter of this
screening mammogram will be mailed directly to the patient.

RECOMMENDATION:
Screening mammogram in one year. (Code:WO-[id number])

BI-RADS CATEGORY  1: Negative.

## 2021-04-11 ENCOUNTER — Ambulatory Visit: Payer: Medicare HMO | Admitting: Dermatology

## 2021-09-05 ENCOUNTER — Other Ambulatory Visit: Payer: Self-pay | Admitting: Internal Medicine

## 2021-09-05 DIAGNOSIS — Z1231 Encounter for screening mammogram for malignant neoplasm of breast: Secondary | ICD-10-CM

## 2021-10-15 ENCOUNTER — Other Ambulatory Visit: Payer: Self-pay

## 2021-10-15 ENCOUNTER — Ambulatory Visit
Admission: RE | Admit: 2021-10-15 | Discharge: 2021-10-15 | Disposition: A | Discharge status: Home / Self Care | Payer: Medicare HMO | Source: Ambulatory Visit | Attending: Internal Medicine | Admitting: Internal Medicine

## 2021-10-15 DIAGNOSIS — Z1231 Encounter for screening mammogram for malignant neoplasm of breast: Secondary | ICD-10-CM | POA: Diagnosis not present

## 2021-12-25 ENCOUNTER — Other Ambulatory Visit
Admission: RE | Admit: 2021-12-25 | Discharge: 2021-12-25 | Disposition: A | Discharge status: Home / Self Care | Payer: Medicare HMO | Attending: Ophthalmology | Admitting: Ophthalmology

## 2021-12-25 DIAGNOSIS — H5710 Ocular pain, unspecified eye: Secondary | ICD-10-CM | POA: Insufficient documentation

## 2021-12-25 LAB — CBC WITH DIFFERENTIAL/PLATELET
Abs Immature Granulocytes: 0.01 10*3/uL (ref 0.00–0.07)
Basophils Absolute: 0 10*3/uL (ref 0.0–0.1)
Basophils Relative: 1 %
Eosinophils Absolute: 0.1 10*3/uL (ref 0.0–0.5)
Eosinophils Relative: 2 %
HCT: 36.3 % (ref 36.0–46.0)
Hemoglobin: 12.1 g/dL (ref 12.0–15.0)
Immature Granulocytes: 0 %
Lymphocytes Relative: 33 %
Lymphs Abs: 2.1 10*3/uL (ref 0.7–4.0)
MCH: 30.6 pg (ref 26.0–34.0)
MCHC: 33.3 g/dL (ref 30.0–36.0)
MCV: 91.9 fL (ref 80.0–100.0)
Monocytes Absolute: 0.4 10*3/uL (ref 0.1–1.0)
Monocytes Relative: 7 %
Neutro Abs: 3.6 10*3/uL (ref 1.7–7.7)
Neutrophils Relative %: 57 %
Platelets: 234 10*3/uL (ref 150–400)
RBC: 3.95 MIL/uL (ref 3.87–5.11)
RDW: 12.2 % (ref 11.5–15.5)
WBC: 6.2 10*3/uL (ref 4.0–10.5)
nRBC: 0 % (ref 0.0–0.2)

## 2021-12-25 LAB — SEDIMENTATION RATE: Sed Rate: 14 mm/hr (ref 0–30)

## 2021-12-25 LAB — C-REACTIVE PROTEIN: CRP: 0.9 mg/dL (ref <1.0)

## 2022-01-11 ENCOUNTER — Other Ambulatory Visit: Payer: Self-pay | Admitting: Otolaryngology

## 2022-01-11 DIAGNOSIS — G501 Atypical facial pain: Secondary | ICD-10-CM

## 2022-01-24 ENCOUNTER — Ambulatory Visit
Admission: RE | Admit: 2022-01-24 | Discharge: 2022-01-24 | Disposition: A | Discharge status: Home / Self Care | Payer: Medicare HMO | Source: Ambulatory Visit | Attending: Otolaryngology | Admitting: Otolaryngology

## 2022-01-24 DIAGNOSIS — G501 Atypical facial pain: Secondary | ICD-10-CM

## 2022-01-24 MED ORDER — GADOBENATE DIMEGLUMINE 529 MG/ML IV SOLN
10.0000 mL | Freq: Once | INTRAVENOUS | Status: AC | PRN
  Administered 2022-01-24: 10 mL via INTRAVENOUS

## 2022-07-24 ENCOUNTER — Ambulatory Visit: Payer: Medicare HMO | Admitting: Dermatology

## 2022-07-24 DIAGNOSIS — L821 Other seborrheic keratosis: Secondary | ICD-10-CM

## 2022-07-24 DIAGNOSIS — Z85828 Personal history of other malignant neoplasm of skin: Secondary | ICD-10-CM

## 2022-07-24 DIAGNOSIS — L570 Actinic keratosis: Secondary | ICD-10-CM | POA: Diagnosis not present

## 2022-07-24 DIAGNOSIS — L578 Other skin changes due to chronic exposure to nonionizing radiation: Secondary | ICD-10-CM | POA: Diagnosis not present

## 2022-07-24 NOTE — Progress Notes (Signed)
Follow-Up Visit   Subjective  [name] is a 81 y.o. female who presents for the following: Irregular skin lesions (On the forehead, R ear, upper back, and mid back - patient is concerned and would like them checked today). The patient has spots, moles and lesions to be evaluated, some may be new or changing.  The following portions of the chart were reviewed this encounter and updated as appropriate:   Tobacco  Allergies  Meds  Problems  Med Hx  Surg Hx  Fam Hx      Review of Systems:  No other skin or systemic complaints except as noted in HPI or Assessment and Plan.  Objective  Well appearing patient in no apparent distress; mood and affect are within normal limits.  A focused examination was performed including the face, trunk, and extremities. Relevant physical exam findings are noted in the Assessment and Plan.  R cheek x 1 Erythematous thin papules/macules with gritty scale.     Assessment & Plan  AK (actinic keratosis) R cheek x 1  Discussed tx options of topical 5FU/Calcipotriene mix vs red light PDT (full face but especially the L nose up to the eyelid). Patient prefers red light PDT. Dr.Corena Tilson will debride area before treatment.    Actinic keratoses are precancerous spots that appear secondary to cumulative UV radiation exposure/sun exposure over time. They are chronic with expected duration over 1 year. A portion of actinic keratoses will progress to squamous cell carcinoma of the skin. It is not possible to reliably predict which spots will progress to skin cancer and so treatment is recommended to prevent development of skin cancer.  Recommend daily broad spectrum sunscreen SPF 30+ to sun-exposed areas, reapply every 2 hours as needed.  Recommend staying in the shade or wearing long sleeves, sun glasses (UVA+UVB protection) and wide brim hats (4-inch brim around the entire circumference of the hat). Call for new or changing lesions.    Seborrheic  Keratoses - R forehead, upper back, mid back - Stuck-on, waxy, tan-brown papules and/or plaques  - Benign-appearing - Discussed benign etiology and prognosis. - Observe - Call for any changes  Actinic Damage with PreCancerous Actinic Keratoses Counseling for Topical Chemotherapy Management: Patient exhibits: - Severe, confluent actinic changes with pre-cancerous actinic keratoses that is secondary to cumulative UV radiation exposure over time - Condition that is severe; chronic, not at goal. - diffuse scaly erythematous macules and papules with underlying dyspigmentation - Discussed Prescription "Field Treatment" topical Chemotherapy for Severe, Chronic Confluent Actinic Changes with Pre-Cancerous Actinic Keratoses Field treatment involves treatment of an entire area of skin that has confluent Actinic Changes (Sun/ Ultraviolet light damage) and PreCancerous Actinic Keratoses by method of PhotoDynamic Therapy (PDT) and/or prescription Topical Chemotherapy agents such as 5-fluorouracil, 5-fluorouracil/calcipotriene, and/or imiquimod.  The purpose is to decrease the number of clinically evident and subclinical PreCancerous lesions to prevent progression to development of skin cancer by chemically destroying early precancer changes that may or may not be visible.  It has been shown to reduce the risk of developing skin cancer in the treated area. As a result of treatment, redness, scaling, crusting, and open sores may occur during treatment course. One or more than one of these methods may be used and may have to be used several times to control, suppress and eliminate the PreCancerous changes. Discussed treatment course, expected reaction, and possible side effects. - Recommend daily broad spectrum sunscreen SPF 30+ to sun-exposed areas, reapply every 2 hours as needed.  -  Staying in the shade or wearing long sleeves, sun glasses (UVA+UVB protection) and wide brim hats (4-inch brim around the entire  circumference of the hat) are also recommended. - Call for new or changing lesions. - Will schedule red light photodynamic therapy with debridement to the face  History of Squamous Cell Carcinoma of the Skin - L cheek - No evidence of recurrence today - No lymphadenopathy - Recommend regular full body skin exams - Recommend daily broad spectrum sunscreen SPF 30+ to sun-exposed areas, reapply every 2 hours as needed.  - Call if any new or changing lesions are noted between office visits  Return for FBSE, red light tx on the face with debridement schedule all at the same appt in January.  Luther Redo, CMA, am acting as scribe for Forest Gleason, MD .  Documentation: I have reviewed the above documentation for accuracy and completeness, and I agree with the above.  Forest Gleason, MD

## 2022-07-24 NOTE — Patient Instructions (Signed)
Due to recent changes in healthcare laws, you may see results of your pathology and/or laboratory studies on MyChart before the doctors have had a chance to review them. We understand that in some cases there may be results that are confusing or concerning to you. Please understand that not all results are received at the same time and often the doctors may need to interpret multiple results in order to provide you with the best plan of care or course of treatment. Therefore, we ask that you please give us 2 business days to thoroughly review all your results before contacting the office for clarification. Should we see a critical lab result, you will be contacted sooner.   If You Need Anything After Your Visit  If you have any questions or concerns for your doctor, please call our main line at [phone] and press option 4 to reach your doctor's medical assistant. If no one answers, please leave a voicemail as directed and we will return your call as soon as possible. Messages left after 4 pm will be answered the following business day.   You may also send us a message via MyChart. We typically respond to MyChart messages within 1-2 business days.  For prescription refills, please ask your pharmacy to contact our office. Our fax number is [phone].  If you have an urgent issue when the clinic is closed that cannot wait until the next business day, you can page your doctor at the number below.    Please note that while we do our best to be available for urgent issues outside of office hours, we are not available 24/7.   If you have an urgent issue and are unable to reach us, you may choose to seek medical care at your doctor's office, retail clinic, urgent care center, or emergency room.  If you have a medical emergency, please immediately call 911 or go to the emergency department.  Pager Numbers  - Dr. Kowalski: [phone]  - Dr. Moye: [phone]  - Dr. Stewart:  [phone]  In the event of inclement weather, please call our main line at [phone] for an update on the status of any delays or closures.  Dermatology Medication Tips: Please keep the boxes that topical medications come in in order to help keep track of the instructions about where and how to use these. Pharmacies typically print the medication instructions only on the boxes and not directly on the medication tubes.   If your medication is too expensive, please contact our office at [phone] option 4 or send us a message through MyChart.   We are unable to tell what your co-pay for medications will be in advance as this is different depending on your insurance coverage. However, we may be able to find a substitute medication at lower cost or fill out paperwork to get insurance to cover a needed medication.   If a prior authorization is required to get your medication covered by your insurance company, please allow us 1-2 business days to complete this process.  Drug prices often vary depending on where the prescription is filled and some pharmacies may offer cheaper prices.  The website www.goodrx.com contains coupons for medications through different pharmacies. The prices here do not account for what the cost may be with help from insurance (it may be cheaper with your insurance), but the website can give you the price if you did not use any insurance.  - You can print the associated coupon and take it with   your prescription to the pharmacy.  - You may also stop by our office during regular business hours and pick up a GoodRx coupon card.  - If you need your prescription sent electronically to a different pharmacy, notify our office through [facility] MyChart or by phone at [phone] option 4.     Si Usted Necesita Algo Despus de Su Visita  Tambin puede enviarnos un mensaje a travs de MyChart. Por lo general respondemos a los mensajes de MyChart en el transcurso de 1 a 2  das hbiles.  Para renovar recetas, por favor pida a su farmacia que se ponga en contacto con nuestra oficina. Nuestro nmero de fax es el [phone].  Si tiene un asunto urgente cuando la clnica est cerrada y que no puede esperar hasta el siguiente da hbil, puede llamar/localizar a su doctor(a) al nmero que aparece a continuacin.   Por favor, tenga en cuenta que aunque hacemos todo lo posible para estar disponibles para asuntos urgentes fuera del horario de oficina, no estamos disponibles las 24 horas del da, los 7 das de la semana.   Si tiene un problema urgente y no puede comunicarse con nosotros, puede optar por buscar atencin mdica  en el consultorio de su doctor(a), en una clnica privada, en un centro de atencin urgente o en una sala de emergencias.  Si tiene una emergencia mdica, por favor llame inmediatamente al 911 o vaya a la sala de emergencias.  Nmeros de bper  - Dr. Kowalski: [phone]  - Dra. Moye: [phone]  - Dra. Stewart: [phone]  En caso de inclemencias del tiempo, por favor llame a nuestra lnea principal al [phone] para una actualizacin sobre el estado de cualquier retraso o cierre.  Consejos para la medicacin en dermatologa: Por favor, guarde las cajas en las que vienen los medicamentos de uso tpico para ayudarle a seguir las instrucciones sobre dnde y cmo usarlos. Las farmacias generalmente imprimen las instrucciones del medicamento slo en las cajas y no directamente en los tubos del medicamento.   Si su medicamento es muy caro, por favor, pngase en contacto con nuestra oficina llamando al [phone] y presione la opcin 4 o envenos un mensaje a travs de MyChart.   No podemos decirle cul ser su copago por los medicamentos por adelantado ya que esto es diferente dependiendo de la cobertura de su seguro. Sin embargo, es posible que podamos encontrar un medicamento sustituto a menor costo o llenar un formulario para que el  seguro cubra el medicamento que se considera necesario.   Si se requiere una autorizacin previa para que su compaa de seguros cubra su medicamento, por favor permtanos de 1 a 2 das hbiles para completar este proceso.  Los precios de los medicamentos varan con frecuencia dependiendo del lugar de dnde se surte la receta y alguna farmacias pueden ofrecer precios ms baratos.  El sitio web www.goodrx.com tiene cupones para medicamentos de diferentes farmacias. Los precios aqu no tienen en cuenta lo que podra costar con la ayuda del seguro (puede ser ms barato con su seguro), pero el sitio web puede darle el precio si no utiliz ningn seguro.  - Puede imprimir el cupn correspondiente y llevarlo con su receta a la farmacia.  - Tambin puede pasar por nuestra oficina durante el horario de atencin regular y recoger una tarjeta de cupones de GoodRx.  - Si necesita que su receta se enve electrnicamente a una farmacia diferente, informe a nuestra oficina a travs de MyChart de [facility]   o por telfono llamando al [phone] y presione la opcin 4.  

## 2022-07-30 ENCOUNTER — Encounter: Payer: Self-pay | Admitting: Dermatology

## 2022-08-14 ENCOUNTER — Other Ambulatory Visit: Payer: Self-pay

## 2022-08-14 ENCOUNTER — Emergency Department: Payer: Medicare HMO

## 2022-08-14 ENCOUNTER — Emergency Department
Admission: EM | Admit: 2022-08-14 | Discharge: 2022-08-14 | Disposition: A | Discharge status: Home / Self Care | Payer: Medicare HMO | Attending: Emergency Medicine | Admitting: Emergency Medicine

## 2022-08-14 ENCOUNTER — Encounter: Payer: Self-pay | Admitting: Emergency Medicine

## 2022-08-14 DIAGNOSIS — R079 Chest pain, unspecified: Secondary | ICD-10-CM

## 2022-08-14 DIAGNOSIS — R911 Solitary pulmonary nodule: Secondary | ICD-10-CM

## 2022-08-14 LAB — TROPONIN I (HIGH SENSITIVITY)
Troponin I (High Sensitivity): 6 ng/L (ref <18)
Troponin I (High Sensitivity): 3 ng/L (ref <18)

## 2022-08-14 LAB — BASIC METABOLIC PANEL
Anion gap: 6 (ref 5–15)
BUN: 11 mg/dL (ref 8–23)
CO2: 27 mmol/L (ref 22–32)
Calcium: 9.1 mg/dL (ref 8.9–10.3)
Chloride: 103 mmol/L (ref 98–111)
Creatinine, Ser: 0.53 mg/dL (ref 0.44–1.00)
GFR, Estimated: >60 mL/min (ref >60)
Glucose, Bld: 94 mg/dL (ref 70–99)
Potassium: 3.8 mmol/L (ref 3.5–5.1)
Sodium: 136 mmol/L (ref 135–145)

## 2022-08-14 LAB — CBC
HCT: 37.3 % (ref 36.0–46.0)
Hemoglobin: 12.4 g/dL (ref 12.0–15.0)
MCH: 31.2 pg (ref 26.0–34.0)
MCHC: 33.2 g/dL (ref 30.0–36.0)
MCV: 94 fL (ref 80.0–100.0)
Platelets: 232 10*3/uL (ref 150–400)
RBC: 3.97 MIL/uL (ref 3.87–5.11)
RDW: 11.9 % (ref 11.5–15.5)
WBC: 6.6 10*3/uL (ref 4.0–10.5)
nRBC: 0 % (ref 0.0–0.2)

## 2022-08-14 MED ORDER — LIDOCAINE VISCOUS HCL 2 % MT SOLN
15.0000 mL | Freq: Once | OROMUCOSAL | Status: AC
  Administered 2022-08-14: 15 mL via ORAL
  Filled 2022-08-14: qty 15

## 2022-08-14 MED ORDER — OXYCODONE-ACETAMINOPHEN 5-325 MG PO TABS
1.0000 | ORAL_TABLET | Freq: Once | ORAL | Status: AC
  Administered 2022-08-14: 1 via ORAL
  Filled 2022-08-14: qty 1

## 2022-08-14 MED ORDER — ONDANSETRON 4 MG PO TBDP: 4.0000 mg | ORAL_TABLET | Freq: Three times a day (TID) | ORAL | 0 refills | Status: DC | PRN

## 2022-08-14 MED ORDER — IOHEXOL 350 MG/ML SOLN
75.0000 mL | Freq: Once | INTRAVENOUS | Status: AC | PRN
  Administered 2022-08-14: 75 mL via INTRAVENOUS

## 2022-08-14 MED ORDER — MORPHINE SULFATE (PF) 4 MG/ML IV SOLN
4.0000 mg | Freq: Once | INTRAVENOUS | Status: AC
  Administered 2022-08-14: 4 mg via INTRAVENOUS
  Filled 2022-08-14: qty 1

## 2022-08-14 MED ORDER — ALUM & MAG HYDROXIDE-SIMETH 200-200-20 MG/5ML PO SUSP
30.0000 mL | Freq: Once | ORAL | Status: AC
Start: 2022-08-14 — End: 2022-08-14
  Administered 2022-08-14: 30 mL via ORAL
  Filled 2022-08-14: qty 30

## 2022-08-14 MED ORDER — LIDOCAINE 5 % EX PTCH
1.0000 | MEDICATED_PATCH | CUTANEOUS | Status: DC
  Administered 2022-08-14: 1 via TRANSDERMAL
  Filled 2022-08-14: qty 1

## 2022-08-14 NOTE — ED Provider Notes (Signed)
[facility] Provider Note    Event Date/Time   First MD Initiated Contact with Patient 08/14/22 [id number]     (approximate)   History   Chief Complaint Chest Pain   HPI  [name] is a 81 y.o. female with past medical history of hyperlipidemia and GERD who presents to the ED complaining of chest pain.  Patient reports that she woke up this morning with sharp pain in the center of her chest that radiates out to both sides of her chest and her shoulders.  Pain has been present constantly since onset, seems to be worse when she takes a deep breath, but she denies any shortness of breath.  She has not had any fevers or cough, states she was feeling well when she went to bed last night.  She has not noticed any pain or swelling in her legs and denies any recent long travel.      Physical Exam   Triage Vital Signs: ED Triage Vitals  Enc Vitals Group     BP 08/14/22 0813 (!) 150/75     Pulse Rate 08/14/22 0813 91     Resp 08/14/22 0813 18     Temp 08/14/22 0813 97.8 F (36.6 C)     Temp Source 08/14/22 0813 Oral     SpO2 08/14/22 0813 99 %     Weight 08/14/22 0812 105 lb (47.6 kg)     Height 08/14/22 0812 '5\' 4"'$  (1.626 m)     Head Circumference --      Peak Flow --      Pain Score 08/14/22 0812 10     Pain Loc --      Pain Edu? --      Excl. in [place]? --     Most recent vital signs: Vitals:   08/14/22 0813 08/14/22 1125  BP: (!) 150/75 108/64  Pulse: 91 81  Resp: 18 13  Temp: 97.8 F (36.6 C)   SpO2: 99% 99%    Constitutional: Alert and oriented. Eyes: Conjunctivae are normal. Head: Atraumatic. Nose: No congestion/rhinnorhea. Mouth/Throat: Mucous membranes are moist.  Cardiovascular: Normal rate, regular rhythm. Grossly normal heart sounds.  2+ radial pulses bilaterally. Respiratory: Normal respiratory effort.  No retractions. Lungs CTAB.  No chest wall tenderness to palpation. Gastrointestinal: Soft and nontender. No  distention. Musculoskeletal: No lower extremity tenderness nor edema.  Neurologic:  Normal speech and language. No gross focal neurologic deficits are appreciated.    ED Results / Procedures / Treatments   Labs (all labs ordered are listed, but only abnormal results are displayed) Labs Reviewed  BASIC METABOLIC PANEL  CBC  TROPONIN I (HIGH SENSITIVITY)  TROPONIN I (HIGH SENSITIVITY)     EKG  ED ECG REPORT I, Blake Divine, the attending physician, personally viewed and interpreted this ECG.   Date: 08/14/2022  EKG Time: 8:13  Rate: 86  Rhythm: normal sinus rhythm  Axis: Normal  Intervals:none  ST&T Change: None  RADIOLOGY Chest x-ray reviewed and interpreted by me with no infiltrate, edema, or effusion.  PROCEDURES:  Critical Care performed: No  Procedures   MEDICATIONS ORDERED IN ED: Medications  lidocaine (LIDODERM) 5 % 1 patch (1 patch Transdermal Patch Applied 08/14/22 1141)  morphine (PF) 4 MG/ML injection 4 mg (4 mg Intravenous Given 08/14/22 0948)  iohexol (OMNIPAQUE) 350 MG/ML injection 75 mL (75 mLs Intravenous Contrast Given 08/14/22 1003)  oxyCODONE-acetaminophen (PERCOCET/ROXICET) 5-325 MG per tablet 1 tablet (1 tablet Oral Given 08/14/22 1141)  alum & mag hydroxide-simeth (MAALOX/MYLANTA) 200-200-20 MG/5ML suspension 30 mL (30 mLs Oral Given 08/14/22 1237)    And  lidocaine (XYLOCAINE) 2 % viscous mouth solution 15 mL (15 mLs Oral Given 08/14/22 1237)     IMPRESSION / MDM / ASSESSMENT AND PLAN / ED COURSE  I reviewed the triage vital signs and the nursing notes.                              81 y.o. female with past medical history of hyperlipidemia and GERD who presents to the ED complaining of sharp chest pain that is worse with a deep breath since she woke up this morning.  Patient's presentation is most consistent with acute presentation with potential threat to life or bodily function.  Differential diagnosis includes, but is not limited  to, ACS, PE, dissection, pneumonia, pneumothorax, musculoskeletal pain, GERD, anxiety, bronchitis, pleurisy.  Patient nontoxic-appearing and in no acute distress, vital signs are unremarkable.  EKG shows no evidence of arrhythmia or ischemia and initial troponin is negative, symptoms seem atypical for ACS but we will check repeat troponin given acute onset.  Chest x-ray is unremarkable and remainder of labs are reassuring with no significant anemia, leukocytosis, lecture abnormality, or AKI.  We will further assess with CTA of her chest to rule out PE, treat symptomatically with IV morphine and reassess.  Repeat troponin within normal limits, low suspicion for ACS given her atypical symptoms.  CTA is negative for PE, does demonstrate an enlarging pulmonary nodule with concerning features.  Patient was notified of this finding and need to follow-up at the pulmonary nodule clinic, but this is unlikely to be the source of her symptoms.  She states pain is partially alleviated following dose of Percocet and Lidoderm patch, was also given GI cocktail given description of burning in the center of her chest but had minimal relief with this.  Given reassuring workup, she is appropriate for discharge home with PCP follow-up, was also given referral to follow-up with cardiology.  She was counseled to return to the ED for new or worsening symptoms, patient agrees with plan.      FINAL CLINICAL IMPRESSION(S) / ED DIAGNOSES   Final diagnoses:  Nonspecific chest pain  Pulmonary nodule     Rx / DC Orders   ED Discharge Orders          Ordered    Ambulatory referral to Cardiology        08/14/22 1309    AMB  Referral to Pulmonary Nodule Clinic        08/14/22 1310             Note:  This document was prepared using Dragon voice recognition software and may include unintentional dictation errors.   Blake Divine, MD 08/14/22 1312

## 2022-08-14 NOTE — ED Triage Notes (Signed)
Patient arrives in wheelchair by POV c/o generalized chest pain onset about 2 hours ago. Reports pain worse when taking a breath. Feels pain in her neck and both shoulders.

## 2022-08-14 NOTE — ED Notes (Signed)
Patient was taken to hallway bathroom via wheelchair and then became nauseous. Dr. Charna Archer  aware.

## 2022-08-14 NOTE — ED Notes (Signed)
Pt. Placed on cont. Cardiac monitoring.

## 2022-09-03 ENCOUNTER — Institutional Professional Consult (permissible substitution): Payer: Medicare HMO | Admitting: Pulmonary Disease

## 2022-09-09 ENCOUNTER — Other Ambulatory Visit: Payer: Self-pay | Admitting: Internal Medicine

## 2022-09-09 DIAGNOSIS — Z1231 Encounter for screening mammogram for malignant neoplasm of breast: Secondary | ICD-10-CM

## 2022-09-10 ENCOUNTER — Encounter: Payer: Self-pay | Admitting: Dermatology

## 2022-09-10 ENCOUNTER — Ambulatory Visit: Payer: Medicare HMO | Admitting: Dermatology

## 2022-09-10 ENCOUNTER — Ambulatory Visit (INDEPENDENT_AMBULATORY_CARE_PROVIDER_SITE_OTHER): Payer: Medicare HMO | Admitting: Dermatology

## 2022-09-10 VITALS — BP 128/75 | HR 82

## 2022-09-10 DIAGNOSIS — Z85828 Personal history of other malignant neoplasm of skin: Secondary | ICD-10-CM

## 2022-09-10 DIAGNOSIS — Z1283 Encounter for screening for malignant neoplasm of skin: Secondary | ICD-10-CM | POA: Diagnosis not present

## 2022-09-10 DIAGNOSIS — L570 Actinic keratosis: Secondary | ICD-10-CM

## 2022-09-10 DIAGNOSIS — L821 Other seborrheic keratosis: Secondary | ICD-10-CM

## 2022-09-10 DIAGNOSIS — L814 Other melanin hyperpigmentation: Secondary | ICD-10-CM | POA: Diagnosis not present

## 2022-09-10 DIAGNOSIS — D229 Melanocytic nevi, unspecified: Secondary | ICD-10-CM

## 2022-09-10 DIAGNOSIS — L578 Other skin changes due to chronic exposure to nonionizing radiation: Secondary | ICD-10-CM

## 2022-09-10 MED ORDER — AMINOLEVULINIC ACID HCL 10 % EX GEL
2000.0000 mg | Freq: Once | CUTANEOUS | Status: AC
  Administered 2022-09-10: 2000 mg via TOPICAL

## 2022-09-10 NOTE — Progress Notes (Signed)
Patient completed PDT therapy today.  1. AK (actinic keratosis) Head - Anterior (Face)  Photodynamic therapy - Head - Anterior (Face) Procedure discussed: discussed risks, benefits, side effects. and alternatives   Prep: site scrubbed/prepped with acetone   Debridement needed: Yes   Location:  Face Number of lesions:  Multiple Type of treatment:  Red light Aminolevulinic Acid (see MAR for details): Ameluz Amount of Ameluz (mg):  1 Incubation time (minutes):  60 Number of minutes under lamp:  16 Number of seconds under lamp:  40 Cooling:  Floor fan and fan Post-procedure details: sunscreen applied    Related Medications Aminolevulinic Acid HCl 10 % GEL 2,000 mg     I personally debrided area prior to application of aminolevulinic acid  Documentation: I have reviewed the above documentation for accuracy and completeness, and I agree with the above.  Forest Gleason, MD

## 2022-09-10 NOTE — Patient Instructions (Signed)

## 2022-09-10 NOTE — Progress Notes (Signed)
Follow-Up Visit   Subjective  [name] is a 82 y.o. female who presents for the following: Annual Exam (Hx of SCCis. Hx of actinic keratoses. Scheduled for Red Light treatment after this appointment ).  The patient presents for Total-Body Skin Exam (TBSE) for skin cancer screening and mole check.  The patient has spots, moles and lesions to be evaluated, some may be new or changing and the patient has concerns that these could be cancer.   The following portions of the chart were reviewed this encounter and updated as appropriate:  Tobacco  Allergies  Meds  Problems  Med Hx  Surg Hx  Fam Hx      Review of Systems: No other skin or systemic complaints except as noted in HPI or Assessment and Plan.   Objective  Well appearing patient in no apparent distress; mood and affect are within normal limits.  A full examination was performed including scalp, head, eyes, ears, nose, lips, neck, chest, axillae, abdomen, back, buttocks, bilateral upper extremities, bilateral lower extremities, hands, feet, fingers, toes, fingernails, and toenails. All findings within normal limits unless otherwise noted below.  sternal notch x1, left pretibia x1, right popliteal fossa x2 (4) Erythematous thin papules/macules with gritty scale.    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 - No evidence of recurrence today - Recommend regular full body skin exams - Recommend daily broad spectrum sunscreen SPF 30+ to sun-exposed areas, reapply every 2 hours as needed.  - Call if any new or changing lesions are noted between office visits    Lentigines - Scattered tan macules - Due to sun exposure - Benign-appearing, observe - Recommend daily broad spectrum sunscreen SPF 30+ to sun-exposed areas, reapply every 2 hours as needed. - Call for any changes  Seborrheic Keratoses - Stuck-on, waxy, tan-brown papules and/or plaques  -  Benign-appearing - Discussed benign etiology and prognosis. - Observe - Call for any changes  Melanocytic Nevi - Tan-brown and/or pink-flesh-colored symmetric macules and papules - Benign appearing on exam today - Observation - Call clinic for new or changing moles - Recommend daily use of broad spectrum spf 30+ sunscreen to sun-exposed areas.   Hemangiomas - Red papules - Discussed benign nature - Observe - Call for any changes  Actinic Damage - Chronic condition, secondary to cumulative UV/sun exposure - diffuse scaly erythematous macules with underlying dyspigmentation - Recommend daily broad spectrum sunscreen SPF 30+ to sun-exposed areas, reapply every 2 hours as needed.  - Staying in the shade or wearing long sleeves, sun glasses (UVA+UVB protection) and wide brim hats (4-inch brim around the entire circumference of the hat) are also recommended for sun protection.  - Call for new or changing lesions.  Skin cancer screening performed today.  AK (actinic keratosis) (4) sternal notch x1, left pretibia x1, right popliteal fossa x2  Actinic keratoses are precancerous spots that appear secondary to cumulative UV radiation exposure/sun exposure over time. They are chronic with expected duration over 1 year. A portion of actinic keratoses will progress to squamous cell carcinoma of the skin. It is not possible to reliably predict which spots will progress to skin cancer and so treatment is recommended to prevent development of skin cancer.  Recommend daily broad spectrum sunscreen SPF 30+ to sun-exposed areas, reapply every 2 hours as needed.  Recommend staying in the shade or wearing long sleeves, sun glasses (UVA+UVB protection) and wide brim hats (4-inch  brim around the entire circumference of the hat). Call for new or changing lesions.  Destruction of lesion - sternal notch x1, left pretibia x1, right popliteal fossa x2  Destruction method: cryotherapy   Informed consent:  discussed and consent obtained   Lesion destroyed using liquid nitrogen: Yes   Region frozen until ice ball extended beyond lesion: Yes   Outcome: patient tolerated procedure well with no complications   Post-procedure details: wound care instructions given   Additional details:  Prior to procedure, discussed risks of blister formation, small wound, skin dyspigmentation, or rare scar following cryotherapy. Recommend Vaseline ointment to treated areas while healing.    Return in about 6 months (around 03/11/2023) for TBSE, HxSCCis, AK Follow Up.  I, Emelia Salisbury, CMA, am acting as scribe for Forest Gleason, MD.  Documentation: I have reviewed the above documentation for accuracy and completeness, and I agree with the above.  Forest Gleason, MD

## 2022-09-10 NOTE — Patient Instructions (Addendum)
Cryotherapy Aftercare  Wash gently with soap and water everyday.   Apply Vaseline Jelly daily until healed.    Recommend daily broad spectrum sunscreen SPF 30+ to sun-exposed areas, reapply every 2 hours as needed. Call for new or changing lesions.  Staying in the shade or wearing long sleeves, sun glasses (UVA+UVB protection) and wide brim hats (4-inch brim around the entire circumference of the hat) are also recommended for sun protection.    Recommend taking Heliocare sun protection supplement daily in sunny weather for additional sun protection. For maximum protection on the sunniest days, you can take up to 2 capsules of regular Heliocare OR take 1 capsule of Heliocare Ultra. For prolonged exposure (such as a full day in the sun), you can repeat your dose of the supplement 4 hours after your first dose. Heliocare can be purchased at [employer], at some [facility] or at [email or web address].    Melanoma ABCDEs  Melanoma is the most dangerous type of skin cancer, and is the leading cause of death from skin disease.  You are more likely to develop melanoma if you: Have light-colored skin, light-colored eyes, or red or blond hair Spend a lot of time in the sun Tan regularly, either outdoors or in a tanning bed Have had blistering sunburns, especially during childhood Have a close family member who has had a melanoma Have atypical moles or large birthmarks  Early detection of melanoma is key since treatment is typically straightforward and cure rates are extremely high if we catch it early.   The first sign of melanoma is often a change in a mole or a new dark spot.  The ABCDE system is a way of remembering the signs of melanoma.  A for asymmetry:  The two halves do not match. B for border:  The edges of the growth are irregular. C for color:  A mixture of colors are present instead of an even brown color. D for diameter:  Melanomas are usually (but not always) greater than 64m -  the size of a pencil eraser. E for evolution:  The spot keeps changing in size, shape, and color.  Please check your skin once per month between visits. You can use a small mirror in front and a large mirror behind you to keep an eye on the back side or your body.   If you see any new or changing lesions before your next follow-up, please call to schedule a visit.  Please continue daily skin protection including broad spectrum sunscreen SPF 30+ to sun-exposed areas, reapplying every 2 hours as needed when you're outdoors.   Staying in the shade or wearing long sleeves, sun glasses (UVA+UVB protection) and wide brim hats (4-inch brim around the entire circumference of the hat) are also recommended for sun protection.    Due to recent changes in healthcare laws, you may see results of your pathology and/or laboratory studies on MyChart before the doctors have had a chance to review them. We understand that in some cases there may be results that are confusing or concerning to you. Please understand that not all results are received at the same time and often the doctors may need to interpret multiple results in order to provide you with the best plan of care or course of treatment. Therefore, we ask that you please give u[place]2 business days to thoroughly review all your results before contacting the office for clarification. Should we see a critical lab result, you will be contacted sooner.  If You Need Anything After Your Visit  If you have any questions or concerns for your doctor, please call our main line at [phone] and press option 4 to reach your doctor's medical assistant. If no one answers, please leave a voicemail as directed and we will return your call as soon as possible. Messages left after 4 pm will be answered the following business day.   You may also send [place] a message via [place]. We typically respond to MyChart messages within 1-2 business days.  For prescription refills,  please ask your pharmacy to contact our office. Our fax number is [phone].  If you have an urgent issue when the clinic is closed that cannot wait until the next business day, you can page your doctor at the number below.    Please note that while we do our best to be available for urgent issues outside of office hours, we are not available 24/7.   If you have an urgent issue and are unable to reach [place], you may choose to seek medical care at your doctor's office, retail clinic, urgent care center, or emergency room.  If you have a medical emergency, please immediately call 911 or go to the emergency department.  Pager Numbers  - Dr. Nehemiah Massed: [phone]  - Dr. Laurence Ferrari: [phone]  - Dr. Nicole Kindred: [phone]  In the event of inclement weather, please call our main line at [phone] for an update on the status of any delays or closures.  Dermatology Medication Tips: Please keep the boxes that topical medications come in in order to help keep track of the instructions about where and how to use these. Pharmacies typically print the medication instructions only on the boxes and not directly on the medication tubes.   If your medication is too expensive, please contact our office at [phone] option 4 or send [place] a message through [place].   We are unable to tell what your co-pay for medications will be in advance as this is different depending on your insurance coverage. However, we may be able to find a substitute medication at lower cost or fill out paperwork to get insurance to cover a needed medication.   If a prior authorization is required to get your medication covered by your insurance company, please allow [place] 1-2 business days to complete this process.  Drug prices often vary depending on where the prescription is filled and some pharmacies may offer cheaper prices.  The website www.goodrx.com contains coupons for medications through different pharmacies. The prices  here do not account for what the cost may be with help from insurance (it may be cheaper with your insurance), but the website can give you the price if you did not use any insurance.  - You can print the associated coupon and take it with your prescription to the pharmacy.  - You may also stop by our office during regular business hours and pick up a GoodRx coupon card.  - If you need your prescription sent electronically to a different pharmacy, notify our office through [facility] or by phone at [phone] option 4.     Si Usted Necesita Algo Despus de Su Visita  Tambin puede enviarnos un mensaje a travs de Pharmacist, community. Por lo general respondemos a los mensajes de MyChart en el transcurso de 1 a 2 das hbiles.  Para renovar recetas, por favor pida a su farmacia que se ponga en contacto con nuestra oficina. Harland Dingwall de fax es [place] [phone].  Si  tiene un asunto urgente cuando la clnica est cerrada y que no puede esperar hasta el siguiente da hbil, puede llamar/localizar a su doctor(a) al nmero que aparece a continuacin.   Por favor, tenga en cuenta que aunque hacemos todo lo posible para estar disponibles para asuntos urgentes fuera del horario de [place], no estamos disponibles las 24 horas del da, los 7 das de la [place].   Si tiene un problema urgente y no puede comunicarse con nosotros, puede optar por buscar atencin mdica  en el consultorio de su doctor(a), en una clnica privada, en un centro de atencin urgente o en una sala de emergencias.  Si tiene Engineering geologist, por favor llame inmediatamente al 911 o vaya a la sala de emergencias.  Nmeros de bper  - Dr. Nehemiah Massed: [phone]  - Dra. Moye: [phone]  - Dra. Nicole Kindred: [phone]  En caso de inclemencias del [place], por favor llame a Johnsie Kindred principal al [phone] para una actualizacin sobre el [place] de cualquier retraso o cierre.  Consejos para la medicacin en  dermatologa: Por favor, guarde las cajas en las que vienen los medicamentos de uso tpico para ayudarle a seguir las instrucciones sobre dnde y cmo usarlos. Las farmacias generalmente imprimen las instrucciones del medicamento slo en las cajas y no directamente en los tubos del [place].   Si su medicamento es muy caro, por favor, pngase en contacto con Zigmund Daniel llamando al [phone] y presione la opcin 4 o envenos un mensaje a travs de Pharmacist, community.   No podemos decirle cul ser su copago por los medicamentos por adelantado ya que esto es diferente dependiendo de la cobertura de su seguro. Sin embargo, es posible que podamos encontrar un medicamento sustituto a Electrical engineer un formulario para que el seguro cubra el medicamento que se considera necesario.   Si se requiere una autorizacin previa para que su compaa de seguros [place] su medicamento, por favor permtanos de 1 a 2 das hbiles para completar este proceso.  Los precios de los medicamentos varan con frecuencia dependiendo del Environmental consultant de dnde se surte la receta y alguna farmacias pueden ofrecer precios ms baratos.  El sitio web www.goodrx.com tiene cupones para medicamentos de Airline pilot. Los precios aqu no tienen en cuenta lo que podra costar con la ayuda del seguro (puede ser ms barato con su seguro), pero el sitio web puede darle el precio si no utiliz Research scientist (physical sciences).  - Puede imprimir el cupn correspondiente y llevarlo con su receta a la farmacia.  - Tambin puede pasar por nuestra oficina durante el horario de atencin regular y Charity fundraiser una tarjeta de cupones de GoodRx.  - Si necesita que su receta se enve electrnicamente a una farmacia diferente, informe a nuestra oficina a travs de MyChart de [place] o por telfono llamando al [phone] y presione la opcin 4.

## 2022-09-19 ENCOUNTER — Telehealth: Payer: Self-pay

## 2022-09-19 MED ORDER — TRIAMCINOLONE ACETONIDE 0.025 % EX OINT: 1.0000 | TOPICAL_OINTMENT | Freq: Two times a day (BID) | CUTANEOUS | 0 refills | Status: AC

## 2022-09-19 NOTE — Telephone Encounter (Signed)
Please have her start triamcinolone 0.025% ointment twice a day for 3-5 days until itching is calmed down. Thank you!

## 2022-09-19 NOTE — Telephone Encounter (Signed)
Patient called regarding Red light treatment. She is still having a lot of itching. She states the redness has cleared up but she does have a few little blisters the itching is so bad and bothersome. aw

## 2022-09-19 NOTE — Telephone Encounter (Signed)
Patient advised of information and RX sent in. aw

## 2022-09-20 ENCOUNTER — Encounter: Payer: Self-pay | Admitting: Dermatology

## 2022-10-10 ENCOUNTER — Institutional Professional Consult (permissible substitution): Payer: Medicare HMO | Admitting: Pulmonary Disease

## 2022-10-17 ENCOUNTER — Ambulatory Visit
Admission: RE | Admit: 2022-10-17 | Discharge: 2022-10-17 | Disposition: A | Discharge status: Home / Self Care | Payer: Medicare HMO | Source: Ambulatory Visit | Attending: Internal Medicine | Admitting: Internal Medicine

## 2022-10-17 DIAGNOSIS — Z1231 Encounter for screening mammogram for malignant neoplasm of breast: Secondary | ICD-10-CM | POA: Diagnosis present

## 2022-11-04 ENCOUNTER — Other Ambulatory Visit: Payer: Self-pay | Admitting: Internal Medicine

## 2022-11-04 DIAGNOSIS — Z0000 Encounter for general adult medical examination without abnormal findings: Secondary | ICD-10-CM

## 2022-11-04 DIAGNOSIS — R911 Solitary pulmonary nodule: Secondary | ICD-10-CM

## 2022-11-15 ENCOUNTER — Ambulatory Visit
Admission: RE | Admit: 2022-11-15 | Discharge: 2022-11-15 | Disposition: A | Discharge status: Home / Self Care | Payer: Medicare HMO | Source: Ambulatory Visit | Attending: Internal Medicine | Admitting: Internal Medicine

## 2022-11-15 DIAGNOSIS — Z0000 Encounter for general adult medical examination without abnormal findings: Secondary | ICD-10-CM

## 2022-11-15 DIAGNOSIS — R911 Solitary pulmonary nodule: Secondary | ICD-10-CM

## 2022-11-20 ENCOUNTER — Other Ambulatory Visit: Payer: Self-pay | Admitting: *Deleted

## 2023-01-01 ENCOUNTER — Ambulatory Visit: Payer: Medicare HMO | Admitting: Dermatology

## 2023-01-02 ENCOUNTER — Ambulatory Visit: Payer: Medicare HMO | Admitting: Dermatology

## 2023-01-02 ENCOUNTER — Encounter: Payer: Self-pay | Admitting: Dermatology

## 2023-01-02 VITALS — BP 95/59 | HR 72

## 2023-01-02 DIAGNOSIS — L578 Other skin changes due to chronic exposure to nonionizing radiation: Secondary | ICD-10-CM | POA: Diagnosis not present

## 2023-01-02 DIAGNOSIS — L821 Other seborrheic keratosis: Secondary | ICD-10-CM

## 2023-01-02 NOTE — Progress Notes (Signed)
   Follow-Up Visit   Subjective  [name] is a 82 y.o. female who presents for the following: Spot on neck. Dur: several weeks. C/O discoloration at neck  The patient has spots, moles and lesions to be evaluated, some may be new or changing and the patient has concerns that these could be cancer.   The following portions of the chart were reviewed this encounter and updated as appropriate: medications, allergies, medical history  Review of Systems:  No other skin or systemic complaints except as noted in HPI or Assessment and Plan.  Objective  Well appearing patient in no apparent distress; mood and affect are within normal limits.  A focused examination was performed of the following areas: Neck  Relevant physical exam findings are noted in the Assessment and Plan.    Assessment & Plan    SEBORRHEIC KERATOSIS - Stuck-on, waxy, tan-brown papules and/or plaques  - Benign-appearing - Discussed benign etiology and prognosis. - Observe - Call for any changes Apply diclofenac (voltaren) gel twice a day to the brown spots on neck. Advised this works in a bit under half of people if used for 3 months, but it is inexpensive with minimal risk of side effects.   ACTINIC DAMAGE - chronic, secondary to cumulative UV radiation exposure/sun exposure over time - diffuse scaly erythematous macules with underlying dyspigmentation - Recommend daily broad spectrum sunscreen SPF 30+ to sun-exposed areas, reapply every 2 hours as needed.  - Recommend staying in the shade or wearing long sleeves, sun glasses (UVA+UVB protection) and wide brim hats (4-inch brim around the entire circumference of the hat). - Call for new or changing lesions.    Return for TBSE As Scheduled.  I, Lawson Radar, CMA, am acting as scribe for Darden Dates, MD.   Documentation: I have reviewed the above documentation for accuracy and completeness, and I agree with the above.  Darden Dates, MD

## 2023-01-02 NOTE — Patient Instructions (Addendum)
Apply diclofenac (voltaren) gel twice a day to the brown spots on neck.      Seborrheic Keratosis  What causes seborrheic keratoses? Seborrheic keratoses are harmless, common skin growths that first appear during adult life.  As time goes by, more growths appear.  Some people may develop a large number of them.  Seborrheic keratoses appear on both covered and uncovered body parts.  They are not caused by sunlight.  The tendency to develop seborrheic keratoses can be inherited.  They vary in color from skin-colored to gray, brown, or even black.  They can be either smooth or have a rough, warty surface.   Seborrheic keratoses are superficial and look as if they were stuck on the skin.  Under the microscope this type of keratosis looks like layers upon layers of skin.  That is why at times the top layer may seem to fall off, but the rest of the growth remains and re-grows.    Treatment Seborrheic keratoses do not need to be treated, but can easily be removed in the office.  Seborrheic keratoses often cause symptoms when they rub on clothing or jewelry.  Lesions can be in the way of shaving.  If they become inflamed, they can cause itching, soreness, or burning.  Removal of a seborrheic keratosis can be accomplished by freezing, burning, or surgery. If any spot bleeds, scabs, or grows rapidly, please return to have it checked, as these can be an indication of a skin cancer.   Due to recent changes in healthcare laws, you may see results of your pathology and/or laboratory studies on MyChart before the doctors have had a chance to review them. We understand that in some cases there may be results that are confusing or concerning to you. Please understand that not all results are received at the same time and often the doctors may need to interpret multiple results in order to provide you with the best plan of care or course of treatment. Therefore, we ask that you please give [place] 2 business days to  thoroughly review all your results before contacting the office for clarification. Should we see a critical lab result, you will be contacted sooner.   If You Need Anything After Your Visit  If you have any questions or concerns for your doctor, please call our main line at [phone] and press option 4 to reach your doctor's medical assistant. If no one answers, please leave a voicemail as directed and we will return your call as soon as possible. Messages left after 4 pm will be answered the following business day.   You may also send [place] a message via MyChart. We typically respond to MyChart messages within 1-2 business days.  For prescription refills, please ask your pharmacy to contact our office. Our fax number is [phone].  If you have an urgent issue when the clinic is closed that cannot wait until the next business day, you can page your doctor at the number below.    Please note that while we do our best to be available for urgent issues outside of office hours, we are not available 24/7.   If you have an urgent issue and are unable to reach [place], you may choose to seek medical care at your doctor's office, retail clinic, urgent care center, or emergency room.  If you have a medical emergency, please immediately call 911 or go to the emergency department.  Pager Numbers  - Dr. Gwen Pounds: [phone]  - Dr. Neale Burly:  [phone]  - Dr. Roseanne Reno: [phone]  In the event of inclement weather, please call our main line at [phone] for an update on the status of any delays or closures.  Dermatology Medication Tips: Please keep the boxes that topical medications come in in order to help keep track of the instructions about where and how to use these. Pharmacies typically print the medication instructions only on the boxes and not directly on the medication tubes.   If your medication is too expensive, please contact our office at [phone] option 4 or send [place] a message  through MyChart.   We are unable to tell what your co-pay for medications will be in advance as this is different depending on your insurance coverage. However, we may be able to find a substitute medication at lower cost or fill out paperwork to get insurance to cover a needed medication.   If a prior authorization is required to get your medication covered by your insurance company, please allow [place] 1-2 business days to complete this process.  Drug prices often vary depending on where the prescription is filled and some pharmacies may offer cheaper prices.  The website www.goodrx.com contains coupons for medications through different pharmacies. The prices here do not account for what the cost may be with help from insurance (it may be cheaper with your insurance), but the website can give you the price if you did not use any insurance.  - You can print the associated coupon and take it with your prescription to the pharmacy.  - You may also stop by our office during regular business hours and pick up a GoodRx coupon card.  - If you need your prescription sent electronically to a different pharmacy, notify our office through [facility] or by phone at [phone] option 4.     Si Usted Necesita Algo Despus de Su Visita  Tambin puede enviarnos un mensaje a travs de Clinical cytogeneticist. Por lo general respondemos a los mensajes de MyChart en el transcurso de 1 a 2 das hbiles.  Para renovar recetas, por favor pida a su farmacia que se ponga en contacto con nuestra oficina. Annie Sable de fax es [place] [phone].  Si tiene un asunto urgente cuando la clnica est cerrada y que no puede esperar hasta el siguiente da hbil, puede llamar/localizar a su doctor(a) al nmero que aparece a continuacin.   Por favor, tenga en cuenta que aunque hacemos todo lo posible para estar disponibles para asuntos urgentes fuera del horario de [place], no estamos disponibles las 24 horas del da, los 7 [address]  [address] de  la [place].   Si tiene un problema urgente y no puede comunicarse con nosotros, puede optar por buscar atencin mdica  en el consultorio de su doctor(a), en una clnica privada, en un centro de atencin urgente o en una sala de emergencias.  Si tiene Engineer, drilling, por favor llame inmediatamente al 911 o vaya a la sala de emergencias.  Nmeros de bper  - Dr. Gwen Pounds: [phone]  - Dra. Moye: [phone]  - Dra. Roseanne Reno: [phone]  En caso de inclemencias del [place], por favor llame a Lacy Duverney principal al [phone] para una actualizacin sobre el [place] de cualquier retraso o cierre.  Consejos para la medicacin en dermatologa: Por favor, guarde las cajas en las que vienen los medicamentos de uso tpico para ayudarle a seguir las instrucciones sobre dnde y cmo usarlos. Las farmacias generalmente imprimen las instrucciones del medicamento slo en las cajas y no directamente  en los tubos del medicamento.   Si su medicamento es muy caro, por favor, pngase en contacto con Rolm Gala llamando al [phone] y presione la opcin 4 o envenos un mensaje a travs de Clinical cytogeneticist.   No podemos decirle cul ser su copago por los medicamentos por adelantado ya que esto es diferente dependiendo de la cobertura de su seguro. Sin embargo, es posible que podamos encontrar un medicamento sustituto a Audiological scientist un formulario para que el seguro cubra el medicamento que se considera necesario.   Si se requiere una autorizacin previa para que su compaa de seguros [place] su medicamento, por favor permtanos de 1 a 2 das hbiles para completar [address]  Los precios de los medicamentos varan con frecuencia dependiendo del Environmental consultant de dnde se surte la receta y alguna farmacias pueden ofrecer precios ms baratos.  El sitio web www.goodrx.com tiene cupones para medicamentos de Health and safety inspector. Los precios aqu no tienen en cuenta lo que podra costar con la ayuda  del seguro (puede ser ms barato con su seguro), pero el sitio web puede darle el precio si no utiliz Tourist information centre manager.  - Puede imprimir el cupn correspondiente y llevarlo con su receta a la farmacia.  - Tambin puede pasar por nuestra oficina durante el horario de atencin regular y Education officer, museum una tarjeta de cupones de GoodRx.  - Si necesita que su receta se enve electrnicamente a una farmacia diferente, informe a nuestra oficina a travs de MyChart de [place] o por telfono llamando al [phone] y presione la opcin 4.

## 2023-01-31 ENCOUNTER — Other Ambulatory Visit: Payer: Self-pay | Admitting: Pulmonary Disease

## 2023-01-31 DIAGNOSIS — R0602 Shortness of breath: Secondary | ICD-10-CM

## 2023-01-31 DIAGNOSIS — R911 Solitary pulmonary nodule: Secondary | ICD-10-CM

## 2023-02-21 ENCOUNTER — Ambulatory Visit
Admission: RE | Admit: 2023-02-21 | Discharge: 2023-02-21 | Disposition: A | Discharge status: Home / Self Care | Payer: Medicare HMO | Source: Ambulatory Visit | Attending: Pulmonary Disease | Admitting: Pulmonary Disease

## 2023-02-21 DIAGNOSIS — R0602 Shortness of breath: Secondary | ICD-10-CM | POA: Diagnosis not present

## 2023-02-21 DIAGNOSIS — R911 Solitary pulmonary nodule: Secondary | ICD-10-CM | POA: Insufficient documentation

## 2023-02-21 MED ORDER — IOHEXOL 300 MG/ML  SOLN
75.0000 mL | Freq: Once | INTRAMUSCULAR | Status: AC | PRN
  Administered 2023-02-21: 75 mL via INTRAVENOUS

## 2023-03-03 ENCOUNTER — Other Ambulatory Visit: Payer: Self-pay | Admitting: Internal Medicine

## 2023-03-03 ENCOUNTER — Ambulatory Visit
Admission: RE | Admit: 2023-03-03 | Discharge: 2023-03-03 | Disposition: A | Discharge status: Home / Self Care | Payer: Medicare HMO | Source: Ambulatory Visit | Attending: Internal Medicine | Admitting: Internal Medicine

## 2023-03-03 DIAGNOSIS — G959 Disease of spinal cord, unspecified: Secondary | ICD-10-CM

## 2023-03-13 ENCOUNTER — Encounter: Payer: Medicare HMO | Admitting: Dermatology

## 2023-03-17 ENCOUNTER — Other Ambulatory Visit: Payer: Self-pay | Admitting: Sports Medicine

## 2023-03-17 DIAGNOSIS — M25551 Pain in right hip: Secondary | ICD-10-CM

## 2023-03-18 ENCOUNTER — Encounter: Payer: Self-pay | Admitting: Pulmonary Disease

## 2023-03-18 ENCOUNTER — Ambulatory Visit: Payer: Medicare HMO | Admitting: Pulmonary Disease

## 2023-03-18 VITALS — BP 108/80 | HR 84 | Temp 98.1°F | Ht 64.0 in | Wt 103.0 lb

## 2023-03-18 DIAGNOSIS — R911 Solitary pulmonary nodule: Secondary | ICD-10-CM | POA: Diagnosis not present

## 2023-03-18 NOTE — Progress Notes (Signed)
Subjective:    Patient ID: [name], female    DOB: [birth year], 82 y.o.   MRN: [id number]  Patient Care Team: [name], MD as PCP - General (Internal Medicine)  Chief Complaint  Patient presents with   Consult    Lung nodules.  Had a recent CT that showed lung nodules.    HPI Patient is an 82 year old lifelong never smoker with a history as noted below, who presents for evaluation of a lung nodule.  She is kindly referred by Dr. Chesley Noon, her primary care provider is Dr. Bethann Punches.  The nodule was noted on CT angio chest performed on 14 August 2022 during the patient's ED visit for chest pain.  A CT angio chest did not show any pulmonary emboli however it showed a lung nodule that was 9 x 10 mm in diameter.  In retrospect, reviewing films from 30 May 2020 the patient had had actually a 3 x 7 mm abnormality on this general area amid vascular structures.  The patient was initially evaluated by [facility] for this abnormality.  The patient however wants to establish care here for follow-up of this issue.  She had a follow-up CT chest on 15 November 2022 that showed the nodule to be 10 x 10 mm in diameter.  Most recent CT chest was on 21 February 2023 as a short-term follow-up from the March imaging.  The nodule appears stable from that measurement.  I have reviewed the films independently, I also showed the films to the patient and answered all questions to her satisfaction.  With regards to this finding the patient is asymptomatic.  She has not had undue weight loss nor anorexia.  No cough or sputum production.  No hemoptysis.  She does have issues with gastroesophageal reflux that are chronic and takes PPI and H2 blocker for control.  She follows antireflux measures.  No other symptomatology.  The patient is a retired Passenger transport manager.  Was exposed to secondhand smoke during her work years but she herself does not smoke.  She lives with her husband Reita Cliche in [place].   Review of Systems A 10 point review of systems was performed and it is as noted above otherwise negative.   Past Medical History:  Diagnosis Date   Actinic keratosis 11/10/2019   Left superior shoulder. Bx proven.   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    Past Surgical History:  Procedure Laterality Date   ABDOMINAL HYSTERECTOMY     partial   CARDIAC CATHETERIZATION     [facility]   cataract     COLONOSCOPY WITH PROPOFOL N/A 06/22/2015   Procedure: COLONOSCOPY WITH PROPOFOL;  Surgeon: Wallace Cullens, MD;  Location[facility] ENDOSCOPY;  Service: Gastroenterology;  Laterality: N/A;   ESOPHAGOGASTRODUODENOSCOPY (EGD) WITH PROPOFOL N/A 06/22/2015   Procedure: ESOPHAGOGASTRODUODENOSCOPY (EGD) WITH PROPOFOL;  Surgeon: Wallace Cullens, MD;  Location: [facility] ENDOSCOPY;  Service: Gastroenterology;  Laterality: N/A;   ESOPHAGOGASTRODUODENOSCOPY (EGD) WITH PROPOFOL N/A 07/12/2020   Procedure: ESOPHAGOGASTRODUODENOSCOPY (EGD) WITH PROPOFOL;  Surgeon: Toledo, Boykin Nearing, MD;  Location: [facility] ENDOSCOPY;  Service: Gastroenterology;  Laterality: N/A;   PARTIAL HYSTERECTOMY     TONSILLECTOMY      Patient Active Problem  List   Diagnosis Date Noted   GERD (gastroesophageal reflux disease) 02/11/2014   HYPERLIPIDEMIA 03/06/2010   CAD, NATIVE VESSEL 03/06/2010    Family History  Problem Relation Age of Onset   Heart failure Mother        CHF   Transient ischemic attack Mother    Heart disease Father    Heart failure Father    Coronary artery disease Other    Breast cancer Maternal Aunt 52    Social History   Tobacco Use   Smoking status: Never    Passive exposure: Past   Smokeless tobacco: Never  Substance Use Topics   Alcohol use: No    Allergies  Allergen  Reactions   Bisphosphonates    Boniva [Ibandronic Acid]    Fosamax [Alendronate Sodium]    Lipitor [Atorvastatin]    Etodolac Rash    Rash     Current Meds  Medication Sig   aspirin 81 MG EC tablet Take 81 mg by mouth daily.   cholecalciferol (VITAMIN D) 400 UNITS TABS tablet Take 2,000 Units by mouth.   Denosumab (PROLIA [place]) Inject into the skin. Semi annual injection   famotidine (PEPCID) 40 MG tablet Take 1 tablet by mouth at bedtime.   Ferrous Fumarate-Vitamin C 65-125 MG TABS Take by mouth.   lovastatin (MEVACOR) 40 MG tablet Take 40 mg by mouth at bedtime.   metoprolol succinate (TOPROL-XL) 25 MG 24 hr tablet Take 1 tablet by mouth daily.   Multiple Vitamin (MULTIVITAMIN) tablet Take 1 tablet by mouth daily.   omeprazole (PRILOSEC) 40 MG capsule Take 40 mg by mouth daily.   ondansetron (ZOFRAN-ODT) 4 MG disintegrating tablet Take 1 tablet (4 mg total) by mouth every 8 (eight) hours as needed for nausea or vomiting.   Tavaborole 5 % SOLN Apply topically daily.   triamcinolone (KENALOG) 0.025 % ointment Apply 1 Application topically 2 (two) times daily. For 3-5 days.    Immunization History  Administered Date(s) Administered   Influenza, High Dose Seasonal PF 08/05/2016, 05/12/2017   Moderna Sars-Covid-2 Vaccination 09/14/2019, 10/12/2019, 06/19/2020, 07/16/2020   Pneumococcal Conjugate-13 06/04/2017   Pneumococcal Polysaccharide-23 01/02/2015   Zoster Recombinant(Shingrix) 02/19/2018, 02/20/2018, 05/04/2018   Zoster, Live 10/04/2012        Objective:   BP 108/80 (BP Location: Left Arm, Patient Position: Sitting, Cuff Size: Normal)   Pulse 84   Temp 98.1 F (36.7 C) (Oral)   Ht 5\' 4"  (1.626 m)   Wt 103 lb (46.7 kg)   SpO2 98%   BMI 17.68 kg/m   SpO2: 98 % O2 Device: None (Room air)  GENERAL: Thin, well-developed woman, no acute distress.  She presents in transport chair due to right hip pain. HEAD: Normocephalic, atraumatic.  EYES: Pupils equal, round,  reactive to light.  No scleral icterus.  MOUTH: Dentition intact. NECK: Supple. No thyromegaly. Trachea midline. No JVD.  No adenopathy. PULMONARY: Good air entry bilaterally.  No adventitious sounds. CARDIOVASCULAR: S1 and S2. Regular rate and rhythm.  ABDOMEN: Benign. MUSCULOSKELETAL: No joint deformity, no clubbing, no edema.  NEUROLOGIC: Grossly intact.  Gait not tested due to limitation in ambulation (right hip pain). SKIN: Intact,warm,dry. PSYCH: Mood and behavior normal.   Representative images from CT performed 21 February 2023 showing an 8 x 9 mm spiculated nodule in the right lower lobe:      Assessment & Plan:     ICD-10-CM   1. Lung nodule seen on imaging study RLL 10 X 10 mm, spiculated  R91.1 NM PET Image Initial (PI) Skull Base To Thigh (F-18 FDG)   Will obtain PET/CT Follow-up 6 to 8 weeks      Orders Placed This Encounter  Procedures   NM PET Image Initial (PI) Skull Base To Thigh (F-18 FDG)    Standing Status:   Future    Standing Expiration Date:   09/18/2023    Order Specific Question:   If indicated for the ordered procedure, I authorize the administration of a radiopharmaceutical per Radiology protocol    Answer:   Yes    Order Specific Question:   Preferred imaging location?    Answer:   [facility]    Will obtain PET/CT to determine next steps with regards to this nodule.  We will follow-up in 4 to 6 weeks time but we will notify the patient of the results of the PET/CT once known.  It is likely she may need biopsy of this lesion.   Gailen Shelter, MD [facility]    *This note was dictated using voice recognition software/Dragon.  Despite best efforts to proofread, errors can occur which can change the meaning. Any transcriptional errors that result from this process are unintentional and may not be fully corrected at the time of dictation.

## 2023-03-18 NOTE — Patient Instructions (Signed)
We are going to get a PET/CT that will tell us more about your nodule.  We will call you with the results of this PET/CT once the radiologist has reviewed it.  We will reconvene in 4-6 weeks.  Please contact us prior to that time should any new difficulties arise.

## 2023-03-19 ENCOUNTER — Ambulatory Visit
Admission: RE | Admit: 2023-03-19 | Discharge: 2023-03-19 | Disposition: A | Discharge status: Home / Self Care | Payer: Medicare HMO | Source: Ambulatory Visit | Attending: Sports Medicine | Admitting: Sports Medicine

## 2023-03-19 DIAGNOSIS — M25551 Pain in right hip: Secondary | ICD-10-CM | POA: Insufficient documentation

## 2023-03-20 ENCOUNTER — Other Ambulatory Visit: Payer: Self-pay | Admitting: Internal Medicine

## 2023-03-20 DIAGNOSIS — G959 Disease of spinal cord, unspecified: Secondary | ICD-10-CM

## 2023-03-24 ENCOUNTER — Emergency Department (HOSPITAL_COMMUNITY): Payer: Medicare HMO | Admitting: Certified Registered"

## 2023-03-24 ENCOUNTER — Emergency Department (HOSPITAL_COMMUNITY): Payer: Medicare HMO

## 2023-03-24 ENCOUNTER — Encounter (HOSPITAL_COMMUNITY): Payer: Self-pay

## 2023-03-24 ENCOUNTER — Inpatient Hospital Stay (HOSPITAL_COMMUNITY): Payer: Medicare HMO

## 2023-03-24 ENCOUNTER — Other Ambulatory Visit: Payer: Self-pay

## 2023-03-24 ENCOUNTER — Inpatient Hospital Stay (HOSPITAL_COMMUNITY)
Admission: EM | Admit: 2023-03-24 | Discharge: 2023-04-04 | DRG: 481 | Disposition: A | Discharge status: Skilled Nursing Facility | Payer: Medicare HMO | Attending: Student | Admitting: Student

## 2023-03-24 ENCOUNTER — Encounter (HOSPITAL_COMMUNITY)
Admission: EM | Disposition: A | Discharge status: Skilled Nursing Facility | Payer: Self-pay | Source: Home / Self Care | Attending: Family Medicine

## 2023-03-24 DIAGNOSIS — S72341A Displaced spiral fracture of shaft of right femur, initial encounter for closed fracture: Secondary | ICD-10-CM | POA: Diagnosis not present

## 2023-03-24 DIAGNOSIS — E559 Vitamin D deficiency, unspecified: Secondary | ICD-10-CM | POA: Diagnosis present

## 2023-03-24 DIAGNOSIS — Z8249 Family history of ischemic heart disease and other diseases of the circulatory system: Secondary | ICD-10-CM | POA: Diagnosis not present

## 2023-03-24 DIAGNOSIS — Z85828 Personal history of other malignant neoplasm of skin: Secondary | ICD-10-CM

## 2023-03-24 DIAGNOSIS — Z79899 Other long term (current) drug therapy: Secondary | ICD-10-CM | POA: Diagnosis not present

## 2023-03-24 DIAGNOSIS — R600 Localized edema: Secondary | ICD-10-CM | POA: Insufficient documentation

## 2023-03-24 DIAGNOSIS — Z7982 Long term (current) use of aspirin: Secondary | ICD-10-CM | POA: Diagnosis not present

## 2023-03-24 DIAGNOSIS — K219 Gastro-esophageal reflux disease without esophagitis: Secondary | ICD-10-CM | POA: Diagnosis present

## 2023-03-24 DIAGNOSIS — E222 Syndrome of inappropriate secretion of antidiuretic hormone: Secondary | ICD-10-CM | POA: Diagnosis present

## 2023-03-24 DIAGNOSIS — G319 Degenerative disease of nervous system, unspecified: Secondary | ICD-10-CM | POA: Diagnosis present

## 2023-03-24 DIAGNOSIS — Z888 Allergy status to other drugs, medicaments and biological substances status: Secondary | ICD-10-CM | POA: Diagnosis not present

## 2023-03-24 DIAGNOSIS — M810 Age-related osteoporosis without current pathological fracture: Secondary | ICD-10-CM | POA: Diagnosis present

## 2023-03-24 DIAGNOSIS — I340 Nonrheumatic mitral (valve) insufficiency: Secondary | ICD-10-CM | POA: Insufficient documentation

## 2023-03-24 DIAGNOSIS — D509 Iron deficiency anemia, unspecified: Secondary | ICD-10-CM | POA: Diagnosis not present

## 2023-03-24 DIAGNOSIS — E871 Hypo-osmolality and hyponatremia: Secondary | ICD-10-CM | POA: Diagnosis present

## 2023-03-24 DIAGNOSIS — W010XXA Fall on same level from slipping, tripping and stumbling without subsequent striking against object, initial encounter: Secondary | ICD-10-CM | POA: Diagnosis present

## 2023-03-24 DIAGNOSIS — E785 Hyperlipidemia, unspecified: Secondary | ICD-10-CM | POA: Diagnosis present

## 2023-03-24 DIAGNOSIS — Y92009 Unspecified place in unspecified non-institutional (private) residence as the place of occurrence of the external cause: Secondary | ICD-10-CM | POA: Diagnosis not present

## 2023-03-24 DIAGNOSIS — I480 Paroxysmal atrial fibrillation: Secondary | ICD-10-CM | POA: Diagnosis present

## 2023-03-24 DIAGNOSIS — I10 Essential (primary) hypertension: Secondary | ICD-10-CM | POA: Diagnosis present

## 2023-03-24 DIAGNOSIS — S72301A Unspecified fracture of shaft of right femur, initial encounter for closed fracture: Secondary | ICD-10-CM

## 2023-03-24 DIAGNOSIS — S72321A Displaced transverse fracture of shaft of right femur, initial encounter for closed fracture: Secondary | ICD-10-CM | POA: Diagnosis not present

## 2023-03-24 DIAGNOSIS — I959 Hypotension, unspecified: Secondary | ICD-10-CM | POA: Diagnosis not present

## 2023-03-24 DIAGNOSIS — D62 Acute posthemorrhagic anemia: Secondary | ICD-10-CM | POA: Diagnosis not present

## 2023-03-24 DIAGNOSIS — T501X5A Adverse effect of loop [high-ceiling] diuretics, initial encounter: Secondary | ICD-10-CM | POA: Diagnosis not present

## 2023-03-24 DIAGNOSIS — E861 Hypovolemia: Secondary | ICD-10-CM | POA: Diagnosis not present

## 2023-03-24 DIAGNOSIS — I081 Rheumatic disorders of both mitral and tricuspid valves: Secondary | ICD-10-CM | POA: Diagnosis present

## 2023-03-24 DIAGNOSIS — W19XXXA Unspecified fall, initial encounter: Principal | ICD-10-CM

## 2023-03-24 DIAGNOSIS — I4891 Unspecified atrial fibrillation: Secondary | ICD-10-CM | POA: Diagnosis not present

## 2023-03-24 HISTORY — DX: Essential (primary) hypertension: I10

## 2023-03-24 HISTORY — DX: Other specified postprocedural states: Z98.890

## 2023-03-24 HISTORY — DX: Nausea with vomiting, unspecified: R11.2

## 2023-03-24 HISTORY — PX: INTRAMEDULLARY (IM) NAIL INTERTROCHANTERIC: SHX5875

## 2023-03-24 LAB — CBC WITH DIFFERENTIAL/PLATELET
Abs Immature Granulocytes: 0.06 10*3/uL (ref 0.00–0.07)
Basophils Absolute: 0.1 10*3/uL (ref 0.0–0.1)
Basophils Relative: 1 %
Eosinophils Absolute: 0.2 10*3/uL (ref 0.0–0.5)
Eosinophils Relative: 2 %
HCT: 32.4 % — ABNORMAL LOW (ref 36.0–46.0)
Hemoglobin: 11.1 g/dL — ABNORMAL LOW (ref 12.0–15.0)
Immature Granulocytes: 1 %
Lymphocytes Relative: 11 %
Lymphs Abs: 0.9 10*3/uL (ref 0.7–4.0)
MCH: 33.2 pg (ref 26.0–34.0)
MCHC: 34.3 g/dL (ref 30.0–36.0)
MCV: 97 fL (ref 80.0–100.0)
Monocytes Absolute: 0.7 10*3/uL (ref 0.1–1.0)
Monocytes Relative: 9 %
Neutro Abs: 6.4 10*3/uL (ref 1.7–7.7)
Neutrophils Relative %: 76 %
Platelets: 252 10*3/uL (ref 150–400)
RBC: 3.34 MIL/uL — ABNORMAL LOW (ref 3.87–5.11)
RDW: 12.1 % (ref 11.5–15.5)
WBC: 8.3 10*3/uL (ref 4.0–10.5)
nRBC: 0 % (ref 0.0–0.2)

## 2023-03-24 LAB — BASIC METABOLIC PANEL
Anion gap: 10 (ref 5–15)
Anion gap: 12 (ref 5–15)
BUN: 9 mg/dL (ref 8–23)
BUN: 9 mg/dL (ref 8–23)
CO2: 23 mmol/L (ref 22–32)
CO2: 20 mmol/L — ABNORMAL LOW (ref 22–32)
Calcium: 8.7 mg/dL — ABNORMAL LOW (ref 8.9–10.3)
Calcium: 8 mg/dL — ABNORMAL LOW (ref 8.9–10.3)
Chloride: 95 mmol/L — ABNORMAL LOW (ref 98–111)
Chloride: 94 mmol/L — ABNORMAL LOW (ref 98–111)
Creatinine, Ser: 0.67 mg/dL (ref 0.44–1.00)
Creatinine, Ser: 0.55 mg/dL (ref 0.44–1.00)
GFR, Estimated: >60 mL/min (ref >60)
GFR, Estimated: >60 mL/min (ref >60)
Glucose, Bld: 117 mg/dL — ABNORMAL HIGH (ref 70–99)
Glucose, Bld: 147 mg/dL — ABNORMAL HIGH (ref 70–99)
Potassium: 3.5 mmol/L (ref 3.5–5.1)
Potassium: 3.5 mmol/L (ref 3.5–5.1)
Sodium: 128 mmol/L — ABNORMAL LOW (ref 135–145)
Sodium: 126 mmol/L — ABNORMAL LOW (ref 135–145)

## 2023-03-24 LAB — TYPE AND SCREEN
ABO/RH(D): A POS
Antibody Screen: NEGATIVE

## 2023-03-24 LAB — HEPATIC FUNCTION PANEL
ALT: 12 U/L (ref 0–44)
AST: 21 U/L (ref 15–41)
Albumin: 3 g/dL — ABNORMAL LOW (ref 3.5–5.0)
Alkaline Phosphatase: 43 U/L (ref 38–126)
Bilirubin, Direct: 0.2 mg/dL (ref 0.0–0.2)
Indirect Bilirubin: 0.4 mg/dL (ref 0.3–0.9)
Total Bilirubin: 0.6 mg/dL (ref 0.3–1.2)
Total Protein: 5.3 g/dL — ABNORMAL LOW (ref 6.5–8.1)

## 2023-03-24 LAB — CBC
HCT: 25.2 % — ABNORMAL LOW (ref 36.0–46.0)
Hemoglobin: 8.7 g/dL — ABNORMAL LOW (ref 12.0–15.0)
MCH: 32.2 pg (ref 26.0–34.0)
MCHC: 34.5 g/dL (ref 30.0–36.0)
MCV: 93.3 fL (ref 80.0–100.0)
Platelets: 228 10*3/uL (ref 150–400)
RBC: 2.7 MIL/uL — ABNORMAL LOW (ref 3.87–5.11)
RDW: 12.1 % (ref 11.5–15.5)
WBC: 9.2 10*3/uL (ref 4.0–10.5)
nRBC: 0 % (ref 0.0–0.2)

## 2023-03-24 LAB — ABO/RH: ABO/RH(D): A POS

## 2023-03-24 LAB — PROTIME-INR
INR: 1 (ref 0.8–1.2)
Prothrombin Time: 13.6 seconds (ref 11.4–15.2)

## 2023-03-24 LAB — SURGICAL PCR SCREEN
MRSA, PCR: NEGATIVE
Staphylococcus aureus: NEGATIVE

## 2023-03-24 LAB — OSMOLALITY: Osmolality: 267 mOsm/kg — ABNORMAL LOW (ref 275–295)

## 2023-03-24 LAB — TSH: TSH: 0.702 u[IU]/mL (ref 0.350–4.500)

## 2023-03-24 SURGERY — FIXATION, FRACTURE, INTERTROCHANTERIC, WITH INTRAMEDULLARY ROD
Anesthesia: Monitor Anesthesia Care | Site: Leg Upper | Laterality: Right

## 2023-03-24 MED ORDER — DOCUSATE SODIUM 100 MG PO CAPS
100.0000 mg | ORAL_CAPSULE | Freq: Two times a day (BID) | ORAL | Status: DC
  Administered 2023-03-24 – 2023-04-04 (×20): 100 mg via ORAL
  Filled 2023-03-24 (×21): qty 1

## 2023-03-24 MED ORDER — PHENYLEPHRINE HCL-NACL 20-0.9 MG/250ML-% IV SOLN
INTRAVENOUS | Status: DC | PRN
  Administered 2023-03-24: 50 ug/min via INTRAVENOUS

## 2023-03-24 MED ORDER — FENTANYL CITRATE (PF) 100 MCG/2ML IJ SOLN
INTRAMUSCULAR | Status: AC
  Administered 2023-03-24: 50 ug
  Filled 2023-03-24: qty 2

## 2023-03-24 MED ORDER — POLYETHYLENE GLYCOL 3350 17 G PO PACK
17.0000 g | PACK | Freq: Every day | ORAL | Status: DC | PRN
  Administered 2023-03-28: 17 g via ORAL
  Filled 2023-03-24: qty 1

## 2023-03-24 MED ORDER — FENTANYL CITRATE (PF) 100 MCG/2ML IJ SOLN: 50.0000 ug | Freq: Once | INTRAMUSCULAR | Status: DC

## 2023-03-24 MED ORDER — SODIUM CHLORIDE 0.9 % IV SOLN: INTRAVENOUS | Status: DC

## 2023-03-24 MED ORDER — SODIUM CHLORIDE 0.9% FLUSH: 3.0000 mL | INTRAVENOUS | Status: DC | PRN

## 2023-03-24 MED ORDER — METOCLOPRAMIDE HCL 5 MG/ML IJ SOLN
5.0000 mg | Freq: Three times a day (TID) | INTRAMUSCULAR | Status: DC | PRN
  Administered 2023-03-24: 10 mg via INTRAVENOUS
  Filled 2023-03-24: qty 2

## 2023-03-24 MED ORDER — ACETAMINOPHEN 10 MG/ML IV SOLN: 1000.0000 mg | Freq: Once | INTRAVENOUS | Status: DC | PRN

## 2023-03-24 MED ORDER — OXYCODONE HCL 5 MG PO TABS: 5.0000 mg | ORAL_TABLET | Freq: Once | ORAL | Status: DC | PRN

## 2023-03-24 MED ORDER — METOPROLOL SUCCINATE ER 25 MG PO TB24
12.5000 mg | ORAL_TABLET | Freq: Every day | ORAL | Status: DC
  Administered 2023-03-24 – 2023-03-31 (×8): 12.5 mg via ORAL
  Filled 2023-03-24 (×8): qty 1

## 2023-03-24 MED ORDER — SODIUM CHLORIDE 0.9% FLUSH
3.0000 mL | Freq: Two times a day (BID) | INTRAVENOUS | Status: DC
  Administered 2023-03-24 – 2023-04-04 (×21): 3 mL via INTRAVENOUS

## 2023-03-24 MED ORDER — ACETAMINOPHEN 10 MG/ML IV SOLN
INTRAVENOUS | Status: AC
  Filled 2023-03-24: qty 100

## 2023-03-24 MED ORDER — CHLORHEXIDINE GLUCONATE 4 % EX SOLN: 60.0000 mL | Freq: Once | CUTANEOUS | Status: DC

## 2023-03-24 MED ORDER — VANCOMYCIN HCL 1000 MG IV SOLR
INTRAVENOUS | Status: AC
  Filled 2023-03-24: qty 20

## 2023-03-24 MED ORDER — ACETAMINOPHEN 325 MG PO TABS: 650.0000 mg | ORAL_TABLET | Freq: Four times a day (QID) | ORAL | Status: DC | PRN

## 2023-03-24 MED ORDER — MORPHINE SULFATE (PF) 2 MG/ML IV SOLN: 0.5000 mg | INTRAVENOUS | Status: DC | PRN

## 2023-03-24 MED ORDER — ONDANSETRON HCL 4 MG/2ML IJ SOLN
4.0000 mg | Freq: Four times a day (QID) | INTRAMUSCULAR | Status: DC | PRN
  Administered 2023-03-24 – 2023-04-03 (×9): 4 mg via INTRAVENOUS
  Filled 2023-03-24 (×9): qty 2

## 2023-03-24 MED ORDER — TRANEXAMIC ACID-NACL 1000-0.7 MG/100ML-% IV SOLN: 1000.0000 mg | Freq: Once | INTRAVENOUS | Status: DC

## 2023-03-24 MED ORDER — ONDANSETRON HCL 4 MG PO TABS: 4.0000 mg | ORAL_TABLET | Freq: Four times a day (QID) | ORAL | Status: DC | PRN

## 2023-03-24 MED ORDER — ROCURONIUM BROMIDE 10 MG/ML (PF) SYRINGE
PREFILLED_SYRINGE | INTRAVENOUS | Status: AC
  Filled 2023-03-24: qty 10

## 2023-03-24 MED ORDER — ACETAMINOPHEN 325 MG PO TABS
325.0000 mg | ORAL_TABLET | Freq: Four times a day (QID) | ORAL | Status: DC | PRN
  Administered 2023-03-30 – 2023-04-03 (×3): 650 mg via ORAL
  Filled 2023-03-24 (×3): qty 2

## 2023-03-24 MED ORDER — CEFAZOLIN SODIUM-DEXTROSE 2-4 GM/100ML-% IV SOLN
INTRAVENOUS | Status: AC
  Filled 2023-03-24: qty 100

## 2023-03-24 MED ORDER — PROPOFOL 500 MG/50ML IV EMUL
INTRAVENOUS | Status: DC | PRN
  Administered 2023-03-24: 50 ug/kg/min via INTRAVENOUS

## 2023-03-24 MED ORDER — PHENYLEPHRINE 80 MCG/ML (10ML) SYRINGE FOR IV PUSH (FOR BLOOD PRESSURE SUPPORT)
PREFILLED_SYRINGE | INTRAVENOUS | Status: AC
  Filled 2023-03-24: qty 20

## 2023-03-24 MED ORDER — MORPHINE SULFATE (PF) 2 MG/ML IV SOLN: 2.0000 mg | INTRAVENOUS | Status: DC | PRN

## 2023-03-24 MED ORDER — ENOXAPARIN SODIUM 40 MG/0.4ML IJ SOSY
40.0000 mg | PREFILLED_SYRINGE | INTRAMUSCULAR | Status: DC
  Administered 2023-03-25 – 2023-04-01 (×8): 40 mg via SUBCUTANEOUS
  Filled 2023-03-24 (×8): qty 0.4

## 2023-03-24 MED ORDER — ACETAMINOPHEN 160 MG/5ML PO SOLN: 1000.0000 mg | Freq: Once | ORAL | Status: DC | PRN

## 2023-03-24 MED ORDER — CEFAZOLIN SODIUM-DEXTROSE 2-4 GM/100ML-% IV SOLN
2.0000 g | Freq: Three times a day (TID) | INTRAVENOUS | Status: AC
  Administered 2023-03-24 – 2023-03-25 (×3): 2 g via INTRAVENOUS
  Filled 2023-03-24 (×3): qty 100

## 2023-03-24 MED ORDER — METHOCARBAMOL 1000 MG/10ML IJ SOLN: 500.0000 mg | Freq: Four times a day (QID) | INTRAVENOUS | Status: DC | PRN

## 2023-03-24 MED ORDER — PROPOFOL 10 MG/ML IV BOLUS
0.5000 mg/kg | Freq: Once | INTRAVENOUS | Status: AC
  Administered 2023-03-24: 40 mg via INTRAVENOUS
  Filled 2023-03-24: qty 20

## 2023-03-24 MED ORDER — SODIUM CHLORIDE 0.9 % IV BOLUS: 1000.0000 mL | Freq: Once | INTRAVENOUS | Status: DC

## 2023-03-24 MED ORDER — TRANEXAMIC ACID-NACL 1000-0.7 MG/100ML-% IV SOLN
1000.0000 mg | Freq: Once | INTRAVENOUS | Status: AC
  Administered 2023-03-24: 1000 mg via INTRAVENOUS
  Filled 2023-03-24: qty 100

## 2023-03-24 MED ORDER — ALBUMIN HUMAN 5 % IV SOLN: INTRAVENOUS | Status: DC | PRN

## 2023-03-24 MED ORDER — EPHEDRINE SULFATE-NACL 50-0.9 MG/10ML-% IV SOSY
PREFILLED_SYRINGE | INTRAVENOUS | Status: DC | PRN
  Administered 2023-03-24 (×2): 5 mg via INTRAVENOUS

## 2023-03-24 MED ORDER — METOCLOPRAMIDE HCL 5 MG PO TABS: 5.0000 mg | ORAL_TABLET | Freq: Three times a day (TID) | ORAL | Status: DC | PRN

## 2023-03-24 MED ORDER — DOCUSATE SODIUM 100 MG PO CAPS: 100.0000 mg | ORAL_CAPSULE | Freq: Two times a day (BID) | ORAL | Status: DC

## 2023-03-24 MED ORDER — FENTANYL CITRATE (PF) 100 MCG/2ML IJ SOLN: 25.0000 ug | INTRAMUSCULAR | Status: DC | PRN

## 2023-03-24 MED ORDER — EPHEDRINE 5 MG/ML INJ
INTRAVENOUS | Status: AC
  Filled 2023-03-24: qty 10

## 2023-03-24 MED ORDER — CHLORHEXIDINE GLUCONATE 0.12 % MT SOLN: 15.0000 mL | Freq: Once | OROMUCOSAL | Status: AC

## 2023-03-24 MED ORDER — POVIDONE-IODINE 10 % EX SWAB
2.0000 | Freq: Once | CUTANEOUS | Status: AC
  Administered 2023-03-24: 2 via TOPICAL

## 2023-03-24 MED ORDER — PHENYLEPHRINE 80 MCG/ML (10ML) SYRINGE FOR IV PUSH (FOR BLOOD PRESSURE SUPPORT)
PREFILLED_SYRINGE | INTRAVENOUS | Status: DC | PRN
  Administered 2023-03-24 (×2): 160 ug via INTRAVENOUS
  Administered 2023-03-24 (×3): 80 ug via INTRAVENOUS
  Administered 2023-03-24: 160 ug via INTRAVENOUS

## 2023-03-24 MED ORDER — OXYCODONE HCL 5 MG/5ML PO SOLN: 5.0000 mg | Freq: Once | ORAL | Status: DC | PRN

## 2023-03-24 MED ORDER — HYDROMORPHONE HCL 1 MG/ML IJ SOLN
0.5000 mg | INTRAMUSCULAR | Status: DC | PRN
  Administered 2023-03-24: 0.5 mg via INTRAVENOUS
  Filled 2023-03-24: qty 1

## 2023-03-24 MED ORDER — METHOCARBAMOL 500 MG PO TABS
500.0000 mg | ORAL_TABLET | Freq: Four times a day (QID) | ORAL | Status: DC | PRN
  Administered 2023-03-24 – 2023-04-03 (×6): 500 mg via ORAL
  Filled 2023-03-24 (×6): qty 1

## 2023-03-24 MED ORDER — ONDANSETRON HCL 4 MG/2ML IJ SOLN
INTRAMUSCULAR | Status: AC
  Filled 2023-03-24: qty 2

## 2023-03-24 MED ORDER — PANTOPRAZOLE SODIUM 40 MG PO TBEC
40.0000 mg | DELAYED_RELEASE_TABLET | Freq: Every day | ORAL | Status: DC
  Administered 2023-03-25 – 2023-04-04 (×11): 40 mg via ORAL
  Filled 2023-03-24 (×11): qty 1

## 2023-03-24 MED ORDER — CHLORHEXIDINE GLUCONATE 0.12 % MT SOLN
OROMUCOSAL | Status: AC
  Administered 2023-03-24: 15 mL via OROMUCOSAL
  Filled 2023-03-24: qty 15

## 2023-03-24 MED ORDER — BUPIVACAINE IN DEXTROSE 0.75-8.25 % IT SOLN
INTRATHECAL | Status: DC | PRN
  Administered 2023-03-24: 1.7 mL via INTRATHECAL

## 2023-03-24 MED ORDER — LIDOCAINE 2% (20 MG/ML) 5 ML SYRINGE
INTRAMUSCULAR | Status: AC
  Filled 2023-03-24: qty 5

## 2023-03-24 MED ORDER — LACTATED RINGERS IV SOLN: INTRAVENOUS | Status: DC

## 2023-03-24 MED ORDER — SODIUM CHLORIDE 0.9 % IV SOLN: 250.0000 mL | INTRAVENOUS | Status: DC | PRN

## 2023-03-24 MED ORDER — ACETAMINOPHEN 650 MG RE SUPP: 650.0000 mg | Freq: Four times a day (QID) | RECTAL | Status: DC | PRN

## 2023-03-24 MED ORDER — METOPROLOL SUCCINATE ER 25 MG PO TB24: 25.0000 mg | ORAL_TABLET | Freq: Every day | ORAL | Status: DC

## 2023-03-24 MED ORDER — 0.9 % SODIUM CHLORIDE (POUR BTL) OPTIME
TOPICAL | Status: DC | PRN
  Administered 2023-03-24: 1000 mL

## 2023-03-24 MED ORDER — CEFAZOLIN SODIUM-DEXTROSE 2-4 GM/100ML-% IV SOLN
2.0000 g | INTRAVENOUS | Status: AC
  Administered 2023-03-24: 2 g via INTRAVENOUS

## 2023-03-24 MED ORDER — PROPOFOL 10 MG/ML IV BOLUS
INTRAVENOUS | Status: AC
  Filled 2023-03-24: qty 20

## 2023-03-24 MED ORDER — ORAL CARE MOUTH RINSE: 15.0000 mL | Freq: Once | OROMUCOSAL | Status: AC

## 2023-03-24 MED ORDER — ACETAMINOPHEN 500 MG PO TABS: 1000.0000 mg | ORAL_TABLET | Freq: Once | ORAL | Status: DC | PRN

## 2023-03-24 MED ORDER — HYDROCODONE-ACETAMINOPHEN 7.5-325 MG PO TABS
1.0000 | ORAL_TABLET | ORAL | Status: DC | PRN
  Administered 2023-03-24 – 2023-03-31 (×6): 2 via ORAL
  Filled 2023-03-24 (×6): qty 2

## 2023-03-24 MED ORDER — PROPOFOL 10 MG/ML IV BOLUS
INTRAVENOUS | Status: DC | PRN
  Administered 2023-03-24: 30 mg via INTRAVENOUS

## 2023-03-24 MED ORDER — FENTANYL CITRATE (PF) 250 MCG/5ML IJ SOLN
INTRAMUSCULAR | Status: AC
  Filled 2023-03-24: qty 5

## 2023-03-24 MED ORDER — ENOXAPARIN SODIUM 40 MG/0.4ML IJ SOSY: 40.0000 mg | PREFILLED_SYRINGE | INTRAMUSCULAR | Status: DC

## 2023-03-24 MED ORDER — POLYETHYLENE GLYCOL 3350 17 G PO PACK: 17.0000 g | PACK | Freq: Every day | ORAL | Status: DC | PRN

## 2023-03-24 MED ORDER — HYDROCODONE-ACETAMINOPHEN 5-325 MG PO TABS
1.0000 | ORAL_TABLET | ORAL | Status: DC | PRN
  Administered 2023-03-29 – 2023-03-30 (×3): 1 via ORAL
  Administered 2023-03-31: 2 via ORAL
  Administered 2023-03-31: 1 via ORAL
  Filled 2023-03-24 (×4): qty 1
  Filled 2023-03-24: qty 2

## 2023-03-24 MED ORDER — HYDRALAZINE HCL 20 MG/ML IJ SOLN: 5.0000 mg | Freq: Three times a day (TID) | INTRAMUSCULAR | Status: DC | PRN

## 2023-03-24 MED ORDER — SODIUM CHLORIDE 0.9 % IV BOLUS
500.0000 mL | Freq: Once | INTRAVENOUS | Status: AC
  Administered 2023-03-24: 500 mL via INTRAVENOUS

## 2023-03-24 SURGICAL SUPPLY — 51 items
ADH SKN CLS APL DERMABOND .7 (GAUZE/BANDAGES/DRESSINGS) ×1
APL PRP STRL LF DISP 70% ISPRP (MISCELLANEOUS) ×1
BAG COUNTER SPONGE SURGICOUNT (BAG) IMPLANT
BAG SPNG CNTER NS LX DISP (BAG)
BIT DRILL CANN FLEX 14 (BIT) IMPLANT
BIT DRILL SHORT 4.2 (BIT) IMPLANT
BIT DRILL STEP 4.5X6.5 (BIT) IMPLANT
BRUSH SCRUB EZ PLAIN DRY (MISCELLANEOUS) ×4 IMPLANT
CHLORAPREP W/TINT 26 (MISCELLANEOUS) ×2 IMPLANT
COVER PERINEAL POST (MISCELLANEOUS) ×2 IMPLANT
COVER SURGICAL LIGHT HANDLE (MISCELLANEOUS) ×2 IMPLANT
DERMABOND ADVANCED .7 DNX12 (GAUZE/BANDAGES/DRESSINGS) ×2 IMPLANT
DRAPE C-ARM 35X43 STRL (DRAPES) ×2 IMPLANT
DRAPE IMP U-DRAPE 54X76 (DRAPES) ×4 IMPLANT
DRAPE INCISE IOBAN 66X45 STRL (DRAPES) ×2 IMPLANT
DRAPE STERI IOBAN 125X83 (DRAPES) ×2 IMPLANT
DRAPE SURG 17X23 STRL (DRAPES) ×4 IMPLANT
DRAPE U-SHAPE 47X51 STRL (DRAPES) ×2 IMPLANT
DRESSING MEPILEX FLEX 4X4 (GAUZE/BANDAGES/DRESSINGS) ×2 IMPLANT
DRILL BIT SHORT 4.2 (BIT) ×2
DRILL BIT STEP 4.5X6.5 (BIT) ×1
DRSG MEPILEX FLEX 4X4 (GAUZE/BANDAGES/DRESSINGS) ×3
DRSG MEPILEX POST OP 4X8 (GAUZE/BANDAGES/DRESSINGS) ×2 IMPLANT
ELECT REM PT RETURN 9FT ADLT (ELECTROSURGICAL) ×1
ELECTRODE REM PT RTRN 9FT ADLT (ELECTROSURGICAL) ×2 IMPLANT
GLOVE BIO SURGEON STRL SZ 6.5 (GLOVE) ×6 IMPLANT
GLOVE BIO SURGEON STRL SZ7.5 (GLOVE) ×8 IMPLANT
GLOVE BIOGEL PI IND STRL 6.5 (GLOVE) ×2 IMPLANT
GLOVE BIOGEL PI IND STRL 7.5 (GLOVE) ×2 IMPLANT
GOWN STRL REUS W/ TWL LRG LVL3 (GOWN DISPOSABLE) ×2 IMPLANT
GOWN STRL REUS W/TWL LRG LVL3 (GOWN DISPOSABLE) ×1
GUIDEWIRE 3.2X400 (WIRE) IMPLANT
KIT BASIN OR (CUSTOM PROCEDURE TRAY) ×2 IMPLANT
KIT TURNOVER KIT B (KITS) ×2 IMPLANT
MANIFOLD NEPTUNE II (INSTRUMENTS) ×2 IMPLANT
NAIL CANN TI FEM RT GT 11X360 (Nail) IMPLANT
NS IRRIG 1000ML POUR BTL (IV SOLUTION) ×2 IMPLANT
PACK GENERAL/GYN (CUSTOM PROCEDURE TRAY) ×2 IMPLANT
PAD ARMBOARD 7.5X6 YLW CONV (MISCELLANEOUS) ×4 IMPLANT
REAMER ROD DEEP FLUTE 2.5X950 (INSTRUMENTS) IMPLANT
SCREW HIP F/IM NL 6.5/L85/XL40 (Screw) IMPLANT
SCREW HIP IM NAIL 6.5X95 (Screw) IMPLANT
SCREW LOCK IM NAIL 5X42 (Screw) IMPLANT
SCREW LOCK LP MED NAIL 5X50 (Screw) IMPLANT
SUT MNCRL AB 3-0 PS2 18 (SUTURE) ×2 IMPLANT
SUT VIC AB 0 CT1 27 (SUTURE)
SUT VIC AB 0 CT1 27XBRD ANBCTR (SUTURE) IMPLANT
SUT VIC AB 2-0 CT1 27 (SUTURE) ×2
SUT VIC AB 2-0 CT1 TAPERPNT 27 (SUTURE) ×4 IMPLANT
TOWEL GREEN STERILE (TOWEL DISPOSABLE) ×4 IMPLANT
WATER STERILE IRR 1000ML POUR (IV SOLUTION) ×2 IMPLANT

## 2023-03-24 NOTE — ED Triage Notes (Signed)
Ground level fall at home tripped while walking, significant R leg deformity, good pedal pulses after transfer. Pt did hit her head, denies LOC. On 81mg  ASA at home

## 2023-03-24 NOTE — ED Provider Notes (Signed)
**Beth Beth** Beth Beth   CSN: [id number] Arrival date & time: 03/24/23  1251     History  Chief Complaint  Patient presents with   Fall   Leg Injury   Head Injury    Beth Beth is a 82 y.o. female.  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  She did hit the back of her head as well but denies any loss of consciousness.  She takes no blood thinners.  She does report she has normal sensation in her right foot.  When EMS arrived patient had impressive deformity of the right femur and they transported her as is.  Patient did receive 100 mcg of fentanyl and route.  Patient's only complaint is of leg pain at this time.  The history is provided by the patient.  Fall  Head Injury      Home Medications Prior to Admission medications   Medication Sig Start Date End Date Taking? Authorizing Provider  ALPRAZolam Prudy Feeler) 0.25 MG tablet Take 0.25 mg by mouth at bedtime as needed for anxiety. Patient not taking: Reported on 07/24/2022    [provider]  aspirin 81 MG EC tablet Take 81 mg by mouth daily.    [provider]  cholecalciferol (VITAMIN D) 400 UNITS TABS tablet Take 2,000 Units by mouth.    [provider]  Denosumab (PROLIA [employer]) Inject into the skin. Semi annual injection    [provider]  famotidine (PEPCID) 40 MG tablet Take 1 tablet by mouth at bedtime. 09/26/21   [provider]  Ferrous Fumarate-Vitamin C 65-125 MG TABS Take by mouth.    [provider]  lovastatin (MEVACOR) 40 MG tablet Take 40 mg by mouth at bedtime.    [provider]  metoprolol succinate (TOPROL-XL) 25 MG 24 hr tablet Take 1 tablet by mouth daily. 03/26/22 03/26/23  [provider]  Multiple  Vitamin (MULTIVITAMIN) tablet Take 1 tablet by mouth daily.    [provider]  omeprazole (PRILOSEC) 40 MG capsule Take 40 mg by mouth daily.    [provider]  ondansetron (ZOFRAN-ODT) 4 MG disintegrating tablet Take 1 tablet (4 mg total) by mouth every 8 (eight) hours as needed for nausea or vomiting. 08/14/22   Chesley Noon, MD  Tavaborole 5 % SOLN Apply topically daily.    [provider]  triamcinolone (KENALOG) 0.025 % ointment Apply 1 Application topically 2 (two) times daily. For 3-5 days. 09/19/22   Moye, [place], MD      Allergies    Bisphosphonates, Boniva [ibandronic acid], Fosamax [alendronate sodium], Lipitor [atorvastatin], and Etodolac    Review of Systems   Review of Systems  Physical Exam Updated Vital Signs BP 128/64   Pulse 80   Resp 16   Ht 5\' 4"  (1.626 m)   Wt 46.7 kg   SpO2 100%   BMI 17.68 kg/m  Physical Exam Vitals and nursing Beth reviewed.  Constitutional:      General: She is not in acute distress.    Appearance: She is well-developed.  HENT:     Head: Normocephalic and atraumatic.  Eyes:     Pupils: Pupils are equal, round, and reactive to light.  Cardiovascular:  Rate and Rhythm: Normal rate and regular rhythm.     Pulses: Normal pulses.     Heart sounds: Normal heart sounds. No murmur heard.    No friction rub.  Pulmonary:     Effort: Pulmonary effort is normal.     Breath sounds: Normal breath sounds. No wheezing or rales.  Abdominal:     General: Bowel sounds are normal. There is no distension.     Palpations: Abdomen is soft.     Tenderness: There is no abdominal tenderness. There is no guarding or rebound.  Musculoskeletal:        General: Tenderness and deformity present. Normal range of motion.     Right upper leg: Deformity and tenderness present.     Comments: No edema.  Significant angulated deformity of the mid femur  Skin:    General: Skin is warm and dry.     Findings: No rash.   Neurological:     Mental Status: She is alert and oriented to person, place, and time.     Cranial Nerves: No cranial nerve deficit.     Comments: Patient is able to wiggle her toes  Psychiatric:        Behavior: Behavior normal.     ED Results / Procedures / Treatments   Labs (all labs ordered are listed, but only abnormal results are displayed) Labs Reviewed  BASIC METABOLIC PANEL - Abnormal; Notable for the following components:      Result Value   Sodium 128 (*)    Chloride 95 (*)    Glucose, Bld 117 (*)    Calcium 8.7 (*)    All other components within normal limits  CBC WITH DIFFERENTIAL/PLATELET - Abnormal; Notable for the following components:   RBC 3.34 (*)    Hemoglobin 11.1 (*)    HCT 32.4 (*)    All other components within normal limits  SURGICAL PCR SCREEN  PROTIME-INR  TYPE AND SCREEN    EKG EKG Interpretation Date/Time:  Monday March 24 2023 13:46:44 EDT Ventricular Rate:  85 PR Interval:  147 QRS Duration:  96 QT Interval:  387 QTC Calculation: 461 R Axis:   56  Text Interpretation: Sinus rhythm Atrial premature complex Probable left atrial enlargement Abnormal R-wave progression, early transition Confirmed by Gwyneth Sprout ([id number]) on 03/24/2023 3:09:38 PM  Radiology DG FEMUR, MIN 2 VIEWS RIGHT  Result Date: 03/24/2023 CLINICAL DATA:  Trip and fall, obvious femur deformity. EXAM: RIGHT FEMUR 2 VIEWS COMPARISON:  None Available. FINDINGS: There is a complete fracture of the midshaft of the femur with foreshortening of the fracture fragments at least 3 cm with proximal component apex anteromedial. No intra-articular extension. Mild degenerative change of the right hip is suspected with joint space loss and subchondral sclerosis. No evidence of avascular necrosis. Limited visualization of the knee appears normal given obliquity and field of view. No radiopaque foreign body. IMPRESSION: Complete fracture of the midshaft of the femur with foreshortening of  the fracture fragments at least 3 cm. No intra-articular extension. Electronically Signed   By: Simonne Come M.D.   On: 03/24/2023 14:39   DG Chest Port 1 View  Result Date: 03/24/2023 CLINICAL DATA:  Tripped and fell.  Pain EXAM: PORTABLE CHEST 1 VIEW COMPARISON:  Chest x-ray 08/14/2022 and CTA FINDINGS: Hyperinflation. Apical pleural thickening with chronic lung changes. No consolidation, pneumothorax or effusion. No edema. Normal cardiopericardial silhouette. Overlapping cardiac leads. Degenerative changes along the spine. The right lower lobe pulmonary nodule seen by  CT is not well identified by x-ray. Please see prior recommendation. IMPRESSION: Hyperinflation with chronic lung changes. The lung nodule identified by prior CT is not well defined on this x-ray. Please see prior recommendations. Electronically Signed   By: Karen Kays M.D.   On: 03/24/2023 14:28    Procedures .Sedation  Date/Time: 03/24/2023 2:01 PM  Performed by: Gwyneth Sprout, MD Authorized by: Gwyneth Sprout, MD   Consent:    Consent obtained:  Verbal   Consent given by:  Patient   Risks discussed:  Prolonged hypoxia resulting in organ damage, allergic reaction and inadequate sedation   Alternatives discussed:  Analgesia without sedation Universal protocol:    Relevant documents present and verified: yes     Test results available: yes     Immediately prior to procedure, a time out was called: yes     Patient identity confirmed:  Verbally with patient Indications:    Procedure performed:  Fracture reduction   Procedure necessitating sedation performed by:  Different physician Pre-sedation assessment:    Time since last food or drink:  11   NPO status caution: urgency dictates proceeding with non-ideal NPO status     ASA classification: class 2 - patient with mild systemic disease     Mouth opening:  3 or more finger widths   Thyromental distance:  4 finger widths   Mallampati score:  II - soft palate,  uvula, fauces visible   Neck mobility: normal     Pre-sedation assessments completed and reviewed: airway patency, cardiovascular function, hydration status, mental status, nausea/vomiting, pain level, respiratory function and temperature     Pre-sedation assessment completed:  03/24/2023 1:02 PM Immediate pre-procedure details:    Reassessment: Patient reassessed immediately prior to procedure     Reviewed: vital signs, relevant labs/tests and NPO status     Verified: bag valve mask available, emergency equipment available, intubation equipment available, IV patency confirmed, oxygen available, reversal medications available and suction available   Procedure details (see MAR for exact dosages):    Preoxygenation:  Nasal cannula   Sedation:  Propofol   Intended level of sedation: deep   Analgesia:  None   Intra-procedure monitoring:  Blood pressure monitoring, cardiac monitor, continuous pulse oximetry, frequent LOC assessments and frequent vital sign checks   Intra-procedure events: none     Total Provider sedation time (minutes):  10 Post-procedure details:    Post-sedation assessment completed:  03/24/2023 2:03 PM   Attendance: Constant attendance by certified staff until patient recovered     Recovery: Patient returned to pre-procedure baseline     Post-sedation assessments completed and reviewed: airway patency, cardiovascular function, hydration status, mental status, nausea/vomiting, pain level, respiratory function and temperature     Patient is stable for discharge or admission: yes     Procedure completion:  Tolerated well, no immediate complications     Medications Ordered in ED Medications  0.9 %  sodium chloride infusion (has no administration in time range)  sodium chloride 0.9 % bolus 500 mL (500 mLs Intravenous New Bag/Given 03/24/23 1318)    And  0.9 %  sodium chloride infusion (has no administration in time range)  HYDROmorphone (DILAUDID) injection 0.5 mg (0.5 mg  Intravenous Given 03/24/23 1353)  propofol (DIPRIVAN) 10 mg/mL bolus/IV push 23.4 mg (40 mg Intravenous Given 03/24/23 1319)    ED Course/ Medical Decision Making/ A&P  Medical Decision Making Amount and/or Complexity of Data Reviewed Independent Historian: EMS Labs: ordered. Decision-making details documented in ED Course. Radiology: ordered and independent interpretation performed. Decision-making details documented in ED Course. ECG/medicine tests: ordered and independent interpretation performed. Decision-making details documented in ED Course.  Risk Prescription drug management. Parenteral controlled substances. Decision regarding hospitalization.   3:19 PM Pt with multiple medical problems and comorbidities and presenting today with a complaint that caries a high risk for morbidity and mortality.  Here today after a fall with significant femur deformity.  Patient required sedation for fracture reduction and then was placed in a knee immobilizer.  She did report hitting her head as well but otherwise it was a ground-level fall and did not meet level 2 criteria.  Patient appeared neurovascularly intact.  Surgical screening labs are pending.  Patient was evaluated by orthopedics Earney Hamburg who did the reduction.  I have independently visualized and interpreted pt's images today.  Plain films show a displaced femur fracture of the midshaft.  Patient will be placed in Buck's traction.  Chest x-ray without acute findings.  3:19 PM I independently interpreted patient's labs and CBC without acute findings, CMP with hyponatremia today of 128 but renal function is stable.  Head CT negative for fracture.  Orthopedics would like to take patient to the OR ASAP.  Will admit to medicine service for medical management.  Patient and family are aware of this plan and are comfortable with this plan.          Final Clinical Impression(s) / ED Diagnoses Final  diagnoses:  Fall, initial encounter  Closed displaced transverse fracture of shaft of right femur, initial encounter (HCC)  Hyponatremia    Rx / DC Orders ED Discharge Orders     None         Gwyneth Sprout, MD 03/24/23 1520

## 2023-03-24 NOTE — Op Note (Signed)
Orthopaedic Surgery Operative Note (CSN: [id number] ) Date of Surgery: 03/24/2023  Admit Date: 03/24/2023   Diagnoses: Pre-Op Diagnoses: Right femoral shaft fracture  Post-Op Diagnosis: Same  Procedures: CPT 27506-Intramedullary nailing of right femoral shaft fracture  Surgeons : Primary: Schyler Butikofer, [name], MD  Assistant: Ulyses Southward, PA-C  Location: OR 3   Anesthesia: General   Antibiotics: Ancef 2g preop   Tourniquet time: None    Estimated Blood Loss: 100 mL  Complications: None   Specimens:None  Implants: Implant Name Type Inv. Item Serial No. Manufacturer Lot No. LRB No. Used Action  NAIL CANN TI FEM RT GT [id number] - [id number] Nail NAIL CANN TI FEM RT GT [id number]  [employer] [id number] Right 1 Implanted  SCREW HIP IM NAIL 6.5X95 - [id number] Screw SCREW HIP IM NAIL 6.5X95  [employer] [id number] Right 1 Implanted  SCREW HIP F/IM NL 6.5/L85/XL40 - [id number] Screw SCREW HIP F/IM NL 6.5/L85/XL40  [employer] [id number] Right 1 Implanted  SCREW LOCK IM NAIL 5X42 - [id number] Screw SCREW LOCK IM NAIL 5X42 [id number] [employer] [id number] Right 1 Implanted  SCREW LOCK LP MED NAIL 5X50 - [id number] Screw SCREW LOCK LP MED NAIL 5X50 [id number] Jasmine December [id number] Right 1 Implanted     Indications for Surgery: 82 year old female who sustained a ground-level fall with a right likely pathologic femur fracture through a area of cortical reactivity from likely stress fracture.  Due to the unstable nature of her injury I recommend proceeding with intramedullary nailing of the right femur.  Risks and benefits were discussed with the patient and her husband.  Risks included but not limited to bleeding, infection, malunion, nonunion, hardware failure, hardware irritation, nerve or blood vessel injury, DVT, even the possibility anesthetic complications.  They agreed to proceed with surgery and consent was obtained.  Operative Findings: Antegrade intramedullary nailing  using femoral recon nail [employer] FRN 9 x 360 mm nail with 2 femoral recon screws and 2 lateral to medial distal interlocking screws  Procedure: The patient was identified in the preoperative holding area. Consent was confirmed with the patient and their family and all questions were answered. The operative extremity was marked after confirmation with the patient. she was then brought back to the operating room by our anesthesia colleagues.  She was placed under spinal anesthetic and a Foley catheter was placed.  She was carefully transferred over to the [facility] table.  All bony prominences were well-padded.  Traction was applied to the right lower extremity and fluoroscopic imaging showed adequate alignment of the fracture.  The right lower extremity was then prepped and draped in usual sterile fashion.  A timeout was performed to verify the patient, the procedure, and the extremity.  Preoperative antibiotics were dosed.  A small incision proximal to the greater trochanter was made and carried down through skin and subcutaneous tissue.  A threaded guidewire was directed at the tip of the greater trochanter and advanced into the proximal metaphysis.  I then used an entry reamer to enter the medullary canal.  I then passed a ball-tipped guidewire down the center of the canal.  I had to use a finger reduction tool to cross the fracture site and into the distal segment.  I then seated the ball-tipped guidewire into the distal metaphysis.  I then measured the length and chose to use a 360 mm nail.  I then sequentially reamed from 8 mm to 12.5 mm and placed an 11 x 360 mm nail.  I attached it to  the targeting arm and placed down the center of the canal.  The fracture aligned appropriately.  Using the targeting arm I then directed threaded guidewires into the head/neck segment.  I confirmed adequate tip apex distance and then measured drilled and placed femoral recon screws.  2 screws were placed.  Excellent fixation  was obtained.  I then let off traction and forward slapped the nail to provide compression across the fracture.  I was able to get the fracture anatomic.  I then used perfect circle technique to place 2 lateral to medial distal locking screws.  The targeting arm was removed and final fluoroscopic imaging was obtained.  The incisions were irrigated and closed with 2-0 Monocryl and 3-0 Monocryl and Dermabond.  Sterile dressings were applied.  The patient was then awoke from anesthesia and taken to the PACU in stable condition.  Post Op Plan/Instructions: The patient will be weightbearing as tolerated to the right lower extremity.  She will receive postoperative Ancef.  She will receive Lovenox for DVT prophylaxis and discharged on aspirin 325 mg.  We will have her mobilize with physical and Occupational Therapy.  I was present and performed the entire surgery.  Ulyses Southward, PA-C did assist me throughout the case. An assistant was necessary given the difficulty in approach, maintenance of reduction and ability to instrument the fracture.   Truitt Merle, MD [facility]

## 2023-03-24 NOTE — Progress Notes (Signed)
Orthopedic Tech Progress Note Patient Details:  [name] 12-04-1940 [id number]  Patient ID: [name], female   DOB: [birth year], 82 y.o.   MRN: [id number] Patient doesn't meet criteria for ohf. Patient must be under 70 to get ohf. [name] 03/24/2023, 11:32 PM

## 2023-03-24 NOTE — Transfer of Care (Signed)
Immediate Anesthesia Transfer of Care Note  Patient: [name]  Procedure(s) Performed: INTRAMEDULLARY (IM) NAIL FEMUR (Right: Leg Upper)  Patient Location: PACU  Anesthesia Type:MAC and Spinal  Level of Consciousness: awake and alert   Airway & Oxygen Therapy: Patient Spontanous Breathing and Patient connected to nasal cannula oxygen  Post-op Assessment: Report given to RN and Post -op Vital signs reviewed and stable  Post vital signs: Reviewed and stable  Last Vitals:  Vitals Value Taken Time  BP 128/97 03/24/23 1945  Temp    Pulse 86 03/24/23 1946  Resp 15 03/24/23 1946  SpO2 99 % 03/24/23 1946  Vitals shown include unfiled device data.  Last Pain:  Vitals:   03/24/23 1541  TempSrc: Oral  PainSc: 8          Complications: No notable events documented.

## 2023-03-24 NOTE — H&P (View-Only) (Signed)
Reason for Consult:Right femur fx Referring Physician: Gwyneth Sprout Time called: 1308 Time at bedside: 1311   [name] is an 82 y.o. female.  HPI: [name] was at home trying to get into her bathroom and lost her balance trying to maneuver her RW. She had immediate right thigh pain and deformity. She was brought to the ED and orthopedic surgery was consulted. Her leg was reduced under CS via EDP and x-rays confirmed a midshaft femur fx. She lives at home with her husband and has been unable to ambulate for the last few weeks 2/2 pain in that right thigh.  Past Medical History:  Diagnosis Date   Actinic keratosis 11/10/2019   Left superior shoulder. Bx proven.   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    Past Surgical History:  Procedure Laterality Date   ABDOMINAL HYSTERECTOMY     partial   CARDIAC CATHETERIZATION     [facility]   cataract     COLONOSCOPY WITH PROPOFOL N/A 06/22/2015   Procedure: COLONOSCOPY WITH PROPOFOL;  Surgeon: Wallace Cullens, MD;  Location: [facility] ENDOSCOPY;  Service: Gastroenterology;  Laterality: N/A;   ESOPHAGOGASTRODUODENOSCOPY (EGD) WITH PROPOFOL N/A 06/22/2015   Procedure: ESOPHAGOGASTRODUODENOSCOPY (EGD) WITH PROPOFOL;  Surgeon: Wallace Cullens, MD;  Location: [facility] ENDOSCOPY;  Service: Gastroenterology;  Laterality: N/A;   ESOPHAGOGASTRODUODENOSCOPY (EGD) WITH PROPOFOL N/A 07/12/2020   Procedure: ESOPHAGOGASTRODUODENOSCOPY (EGD) WITH PROPOFOL;  Surgeon: Toledo, Boykin Nearing, MD;  Location: [facility] ENDOSCOPY;  Service: Gastroenterology;  Laterality: N/A;   PARTIAL HYSTERECTOMY     TONSILLECTOMY      Family History  Problem Relation Age of Onset   Heart failure Mother        CHF   Transient ischemic attack Mother    Heart disease Father    Heart  failure Father    Coronary artery disease Other    Breast cancer Maternal Aunt 34    Social History:  reports that she has never smoked. She has been exposed to tobacco smoke. She has never used smokeless tobacco. She reports that she does not drink alcohol and does not use drugs.  Allergies:  Allergies  Allergen Reactions   Bisphosphonates    Boniva [Ibandronic Acid]    Fosamax [Alendronate Sodium]    Lipitor [Atorvastatin]    Etodolac Rash    Rash     Medications: I have reviewed the patient's current medications.  Results for orders placed or performed during the hospital encounter of 03/24/23 (from the past 48 hour(s))  Type and screen [facility]     Status: None (Preliminary result)   Collection Time: 03/24/23  2:00 PM  Result Value Ref Range   ABO/RH(D) PENDING    Antibody Screen PENDING    Sample Expiration      03/27/2023,2359 Performed at [facility], [address] [place], [place] [address]     DG FEMUR, MIN 2 VIEWS RIGHT  Result Date: 03/24/2023 CLINICAL DATA:  Trip and fall, obvious femur deformity. EXAM: RIGHT FEMUR 2 VIEWS COMPARISON:  None Available. FINDINGS: There is a complete fracture of the midshaft of the femur with foreshortening of the fracture fragments at least 3 cm with proximal component apex anteromedial. No intra-articular extension. Mild degenerative  change of the right hip is suspected with joint space loss and subchondral sclerosis. No evidence of avascular necrosis. Limited visualization of the knee appears normal given obliquity and field of view. No radiopaque foreign body. IMPRESSION: Complete fracture of the midshaft of the femur with foreshortening of the fracture fragments at least 3 cm. No intra-articular extension. Electronically Signed   By: Simonne Come M.D.   On: 03/24/2023 14:39   DG Chest Port 1 View  Result Date: 03/24/2023 CLINICAL DATA:  Tripped and fell.  Pain EXAM: PORTABLE CHEST 1 VIEW COMPARISON:   Chest x-ray 08/14/2022 and CTA FINDINGS: Hyperinflation. Apical pleural thickening with chronic lung changes. No consolidation, pneumothorax or effusion. No edema. Normal cardiopericardial silhouette. Overlapping cardiac leads. Degenerative changes along the spine. The right lower lobe pulmonary nodule seen by CT is not well identified by x-ray. Please see prior recommendation. IMPRESSION: Hyperinflation with chronic lung changes. The lung nodule identified by prior CT is not well defined on this x-ray. Please see prior recommendations. Electronically Signed   By: Karen Kays M.D.   On: 03/24/2023 14:28    Review of Systems  HENT:  Negative for ear discharge, ear pain, hearing loss and tinnitus.   Eyes:  Negative for photophobia and pain.  Respiratory:  Negative for cough and shortness of breath.   Cardiovascular:  Negative for chest pain.  Gastrointestinal:  Negative for abdominal pain, nausea and vomiting.  Genitourinary:  Negative for dysuria, flank pain, frequency and urgency.  Musculoskeletal:  Positive for arthralgias (Right thigh). Negative for back pain, myalgias and neck pain.  Neurological:  Negative for dizziness and headaches.  Hematological:  Does not bruise/bleed easily.  Psychiatric/Behavioral:  The patient is not nervous/anxious.    Blood pressure 128/64, pulse 80, resp. rate 16, height 5\' 4"  (1.626 m), weight 46.7 kg, SpO2 100%. Physical Exam Constitutional:      General: She is not in acute distress.    Appearance: She is well-developed. She is not diaphoretic.  HENT:     Head: Normocephalic and atraumatic.  Eyes:     General: No scleral icterus.       Right eye: No discharge.        Left eye: No discharge.     Conjunctiva/sclera: Conjunctivae normal.  Cardiovascular:     Rate and Rhythm: Normal rate and regular rhythm.  Pulmonary:     Effort: Pulmonary effort is normal. No respiratory distress.  Musculoskeletal:     Cervical back: Normal range of motion.      Comments: RLE No traumatic wounds, ecchymosis, or rash  Gross deformity to prox thigh (improved after reduction)  No knee or ankle effusion  Knee stable to varus/ valgus and anterior/posterior stress  Sens DPN, SPN, TN intact  Motor EHL, ext, flex, evers 5/5  DP 2+, PT 2+, No significant edema  Skin:    General: Skin is warm and dry.  Neurological:     Mental Status: She is alert.  Psychiatric:        Mood and Affect: Mood normal.        Behavior: Behavior normal.     Assessment/Plan: Right femur fx -- Plan IMN tonight with Dr. [name]. Please keep NPO.    Freeman Caldron, PA-C Orthopedic Surgery [phone] 03/24/2023, 2:44 PM

## 2023-03-24 NOTE — Anesthesia Preprocedure Evaluation (Addendum)
Anesthesia Evaluation  Patient identified by MRN, date of birth, ID band Patient awake  General Assessment Comment:NPO 0900  Reviewed: Allergy & Precautions, NPO status , Patient's Chart, lab work & pertinent test results, reviewed documented beta blocker date and time   History of Anesthesia Complications (+) PONV and history of anesthetic complications  Airway Mallampati: IV  TM Distance: <3 FB Neck ROM: Full    Dental  (+) Teeth Intact, Dental Advisory Given   Pulmonary neg pulmonary ROS   breath sounds clear to auscultation       Cardiovascular  Rhythm:Regular  INTERPRETATION  MILD LV SYSTOLIC DYSFUNCTION (See above)   WITH MILD LVH  NORMAL RIGHT VENTRICULAR SYSTOLIC FUNCTION  MILD VALVULAR REGURGITATION (See above)  NO VALVULAR STENOSIS  MILD TR, MR  TRIVUAL AR  EF 40-45%     Neuro/Psych negative neurological ROS  negative psych ROS   GI/Hepatic Neg liver ROS,GERD  Medicated and Controlled,,  Endo/Other  negative endocrine ROS    Renal/GU negative Renal ROS     Musculoskeletal   Abdominal   Peds  Hematology  (+) Blood dyscrasia, anemia Lab Results      Component                Value               Date                      WBC                      8.3                 03/24/2023                HGB                      11.1 (L)            03/24/2023                HCT                      32.4 (L)            03/24/2023                MCV                      97.0                03/24/2023                PLT                      252                 03/24/2023              Anesthesia Other Findings   Reproductive/Obstetrics                             Anesthesia Physical Anesthesia Plan  ASA: 2  Anesthesia Plan: MAC and Spinal   Post-op Pain Management: Ofirmev IV (intra-op)*   Induction: Intravenous  PONV Risk Score and Plan: 4 or greater and Ondansetron and  Dexamethasone  Airway Management Planned: Nasal Cannula, Natural Airway and Simple  Face Mask  Additional Equipment: None  Intra-op Plan:   Post-operative Plan: Extubation in OR  Informed Consent: I have reviewed the patients History and Physical, chart, labs and discussed the procedure including the risks, benefits and alternatives for the proposed anesthesia with the patient or authorized representative who has indicated his/her understanding and acceptance.     Dental advisory given  Plan Discussed with: CRNA  Anesthesia Plan Comments:         Anesthesia Quick Evaluation

## 2023-03-24 NOTE — Plan of Care (Signed)

## 2023-03-24 NOTE — Progress Notes (Signed)
Patient received Fentanyl 50 mcg IV per Dr. Maple Hudson verbal order, for pain 10/10 in right hip

## 2023-03-24 NOTE — Anesthesia Procedure Notes (Signed)
Spinal  Patient location during procedure: OR Start time: 03/24/2023 6:07 PM End time: 03/24/2023 6:17 PM Reason for block: surgical anesthesia Staffing Performed: anesthesiologist  Anesthesiologist: Heather Roberts, MD Performed by: Heather Roberts, MD Authorized by: Heather Roberts, MD   Preanesthetic Checklist Completed: patient identified, IV checked, risks and benefits discussed, surgical consent, monitors and equipment checked, pre-op evaluation and timeout performed Spinal Block Patient position: right lateral decubitus Prep: DuraPrep Patient monitoring: cardiac monitor, continuous pulse ox and blood pressure Approach: midline Location: L2-3 Injection technique: single-shot Needle Needle type: Pencan and Quincke  Needle gauge: 22 G Needle length: 9 cm Assessment Events: CSF return Additional Notes Functioning IV was confirmed and monitors were applied. Sterile prep and drape, including hand hygiene and sterile gloves were used. The patient was positioned and the spine was prepped. The skin was anesthetized with lidocaine.  Free flow of clear CSF was obtained prior to injecting local anesthetic into the CSF.  The spinal needle aspirated freely following injection.  The needle was carefully withdrawn.  The patient tolerated the procedure well.

## 2023-03-24 NOTE — Consult Note (Incomplete)
[facility] Medical Consultation  [name]:[id number] DOB: Jul 22, 1941 DOA: 03/24/2023 PCP: [name], MD   Requesting physician: Dr. Truitt Merle Date of consultation: 03/24/2023 Reason for consultation: Continue chronic medical management  Impression/Recommendations Principal Problem:   Right femoral shaft fracture s/p reduction and internal fixation 03/24/2023 [facility]) Active Problems:   Hyponatremia   Essential hypertension   Chronic mitral valve regurgitation   Bilateral lower extremity edema   Chief Complaint: Fall at home due to a loss of balance  HPI:  [name] is a 82 y.o. female with medical history significant of essential hypertension, chronic mitral regurgitation, diverticulosis, esophageal web, GERD, history of skin cancer, hyperlipidemia, low vitamin D, chronic osteoporosis, chronic pulmonary nodule and chronic bilateral lower extremity edema   Patient reported she was trying to get into bathroom and lost her balance fell on the ground.  She immediately developed right-sided thigh pain and deformity.  Patient was brought to ED by EMS for evaluation.  Orthopedic consulted in the ED peon her leg was reduced under CS via EDP and x-ray confirmed midshaft femoral fracture.  Patient underwent surgery internal fixation by orthopedics tonight around 7:30 PM 03/24/2023.  Orthopedic recommended weightbearing  weightbearing as tolerated to the right lower extremity. She will receive postoperative Ancef. She will receive Lovenox for DVT prophylaxis and discharged on aspirin 325 mg. We will have her mobilize with physical and Occupational Therapy.    Initial presentation to ED heart rate 76, respiratory 22, blood pressure 133/69 and O2 sat 99% on 2 L oxygen.  Postop patient's vitals are stable heart rate 89, respiratory rate 20, blood pressure 151/81 and O2 sat 90% on 3 L oxygen.  Initial lab work showed sodium low sodium 128, potassium 3.5, chloride 95, blood  glucose 9017, BUN 9, creatinine 0.69 and calcium 8 7.  GFR above 60 anion gap 10.  CBC WBC 8.3, hemoglobin 11.1 and hematocrit 32, platelet 352.     Review of Systems: ROS ***  Past Medical History:  Diagnosis Date   Actinic keratosis 11/10/2019   Left superior shoulder. Bx proven.   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 SCCis   Past Surgical History:  Procedure Laterality Date   ABDOMINAL HYSTERECTOMY     partial   CARDIAC CATHETERIZATION     [facility]   cataract     COLONOSCOPY WITH PROPOFOL N/A 06/22/2015   Procedure: COLONOSCOPY WITH PROPOFOL;  Surgeon: Wallace Cullens, MD;  Location: [facility] ENDOSCOPY;  Service: Gastroenterology;  Laterality: N/A;   ESOPHAGOGASTRODUODENOSCOPY (EGD) WITH PROPOFOL N/A 06/22/2015   Procedure: ESOPHAGOGASTRODUODENOSCOPY (EGD) WITH PROPOFOL;  Surgeon: Wallace Cullens, MD;  Location: [facility] ENDOSCOPY;  Service: Gastroenterology;  Laterality: N/A;   ESOPHAGOGASTRODUODENOSCOPY (EGD) WITH PROPOFOL N/A 07/12/2020   Procedure: ESOPHAGOGASTRODUODENOSCOPY (EGD) WITH PROPOFOL;  Surgeon: Toledo, Boykin Nearing, MD;  Location: [facility] ENDOSCOPY;  Service: Gastroenterology;  Laterality: N/A;   PARTIAL HYSTERECTOMY     TONSILLECTOMY     Social History:  reports that she has never smoked. She has been exposed to tobacco smoke. She has never used smokeless tobacco. She reports that she does not drink alcohol and does not use drugs.  Allergies  Allergen Reactions   Bisphosphonates    Boniva [Ibandronic Acid]    Fosamax [Alendronate Sodium]    Lipitor [Atorvastatin]    Etodolac Rash    Rash    Family History  Problem Relation Age of Onset   Heart failure Mother        CHF   Transient  ischemic attack Mother    Heart disease Father    Heart failure Father    Coronary artery disease Other    Breast cancer Maternal Aunt 52    Prior to Admission medications   Medication Sig Start Date End Date Taking? Authorizing Provider  aspirin 81 MG EC tablet Take 81 mg by mouth daily.   Yes [provider]  famotidine (PEPCID) 40 MG tablet Take 1 tablet by mouth at bedtime. 09/26/21  Yes [provider]  lovastatin (MEVACOR) 40 MG tablet Take 40 mg by mouth at bedtime.   Yes [provider]  metoprolol succinate (TOPROL-XL) 25 MG 24 hr tablet Take 1 tablet by mouth daily. 03/26/22 03/26/23 Yes [provider]  omeprazole (PRILOSEC) 40 MG capsule Take 40 mg by mouth daily.   Yes [provider]  ALPRAZolam (XANAX) 0.25 MG tablet Take 0.25 mg by mouth at bedtime as needed for anxiety. Patient not taking: Reported on 07/24/2022    [provider]  cholecalciferol (VITAMIN D) 400 UNITS TABS tablet Take 2,000 Units by mouth.    [provider]  Denosumab (PROLIA [place]) Inject into the skin. Semi annual injection    [provider]  Ferrous Fumarate-Vitamin C 65-125 MG TABS Take by mouth.    [provider]  Multiple Vitamin (MULTIVITAMIN) tablet Take 1 tablet by mouth daily.    [provider]  ondansetron (ZOFRAN-ODT) 4 MG disintegrating tablet Take 1 tablet (4 mg total) by mouth every 8 (eight) hours as needed for nausea or vomiting. 08/14/22   Chesley Noon, MD  Tavaborole 5 % SOLN Apply topically daily.    [provider]  triamcinolone (KENALOG) 0.025 % ointment Apply 1 Application topically 2 (two) times daily. For 3-5 days. 09/19/22   Sandi Mealy, MD   Physical Exam:  Constitutional: NAD, calm, comfortable Vitals:   03/24/23 1941 03/24/23 1943 03/24/23 1945 03/24/23 2000  BP: (!) 148/133 (!) 156/96 (!) 128/97 (!) 151/81  Pulse: 90 89 81   Resp: 17 10 20    Temp:      TempSrc:       SpO2: 90% 98% 92%   Weight:      Height:       Eyes: PERRL, lids and conjunctivae normal ENMT: Mucous membranes are moist. Posterior pharynx clear of any exudate or lesions.Normal dentition.  Neck: normal, supple, no masses, no thyromegaly Respiratory: clear to auscultation bilaterally, no wheezing, no crackles. Normal respiratory effort. No accessory muscle use.  Cardiovascular: Regular rate and rhythm, no murmurs / rubs / gallops. No extremity edema. 2+ pedal pulses. No carotid bruits.  Abdomen: no tenderness, no masses palpated. No hepatosplenomegaly. Bowel sounds positive.  Musculoskeletal: no clubbing / cyanosis. No joint deformity upper and lower extremities. Good ROM, no contractures. Normal muscle tone.  Skin: no rashes, lesions, ulcers. No induration Neurologic: CN 2-12 grossly intact. Sensation intact, DTR normal. Strength 5/5 in all 4.  Psychiatric: Normal judgment and insight. Alert and oriented x 3. Normal mood.   Labs on Admission:  Basic Metabolic Panel: Recent Labs  Lab 03/24/23 1310  NA 128*  K 3.5  CL 95*  CO2 23  GLUCOSE 117*  BUN 9  CREATININE 0.67  CALCIUM 8.7*   Liver Function Tests: No results for input(s): "AST", "ALT", "ALKPHOS", "BILITOT", "PROT", "ALBUMIN" in the last 168 hours. No results for input(s): "LIPASE", "AMYLASE" in the last 168 hours. No results for input(s): "AMMONIA" in the last 168 hours. CBC: Recent Labs  Lab 03/24/23 1310  WBC 8.3  NEUTROABS 6.4  HGB 11.1*  HCT 32.4*  MCV 97.0  PLT 252   Cardiac Enzymes: No results for input(s): "CKTOTAL", "CKMB", "CKMBINDEX", "TROPONINI" in the last 168 hours. BNP: Invalid input(s): "POCBNP" CBG: No results for input(s): "GLUCAP" in the last 168 hours.  Radiological Exams on Admission: DG C-Arm 1-60 Min-No Report  Result Date: 03/24/2023 Fluoroscopy was utilized by the requesting physician.  No radiographic interpretation.   CT FEMUR RIGHT WO CONTRAST  Result Date:  03/24/2023 CLINICAL DATA:  Right femur fracture EXAM: CT OF THE LOWER RIGHT EXTREMITY WITHOUT CONTRAST TECHNIQUE: Multidetector CT imaging of the right lower extremity was performed according to the standard protocol. RADIATION DOSE REDUCTION: This exam was performed according to the departmental dose-optimization program which includes automated exposure control, adjustment of the mA and/or kV according to patient size and/or use of iterative reconstruction technique. COMPARISON:  X-ray 03/24/2023 FINDINGS: Bones/Joint/Cartilage Acute mid shaft fracture of the right femur without comminution. There is 1 shaft width of posterior displacement and approximately 2.2 cm of fracture overlap/shortening. No underlying lytic or sclerotic lesion is identified to suggest a pathologic fracture at this location. Remainder of the femur is otherwise intact. No additional fractures. No dislocation at the hip or knee. 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 Ligaments Suboptimally assessed by CT. Muscles and Tendons No acute musculotendinous abnormality by CT. Soft tissues No organized hematoma.  No right inguinal lymphadenopathy. IMPRESSION: Acute displaced fracture of the right femoral diaphysis, as described. No underlying lytic or sclerotic lesion is identified to suggest a pathologic fracture at this location. Electronically Signed   By: Duanne Guess D.O.   On: 03/24/2023 15:46   CT HEAD WO CONTRAST  Result Date: 03/24/2023 CLINICAL DATA:  Trauma, fall EXAM: CT HEAD WITHOUT CONTRAST TECHNIQUE: Contiguous axial images were obtained from the base of the skull through the vertex without intravenous contrast. RADIATION DOSE REDUCTION: This exam was performed according to the departmental dose-optimization program which includes automated exposure control, adjustment of the mA and/or kV according to patient size and/or use of iterative reconstruction technique. COMPARISON:  None Available. FINDINGS: Brain: No acute  intracranial findings are seen. There are no signs of bleeding within the cranium. Cortical sulci are prominent. Ventricles are not dilated. Vascular: Unremarkable. Skull: No fracture is seen in calvarium. Sinuses/Orbits: Unremarkable. Other: None. IMPRESSION: No acute intracranial findings are seen in noncontrast CT brain. Atrophy. Electronically Signed   By: Ernie Avena M.D.   On: 03/24/2023 15:27   DG FEMUR, MIN 2 VIEWS RIGHT  Result Date: 03/24/2023 CLINICAL DATA:  Trip and fall, obvious femur deformity. EXAM: RIGHT FEMUR 2 VIEWS COMPARISON:  None Available. FINDINGS: There is a complete fracture of the midshaft of the femur with foreshortening of the fracture fragments at least 3 cm with proximal component apex anteromedial. No intra-articular extension. Mild degenerative change of the right hip is suspected with joint space loss and subchondral sclerosis. No evidence of avascular necrosis. Limited visualization of the knee appears normal given obliquity and field of view. No radiopaque foreign body. IMPRESSION: Complete fracture of the midshaft of the femur with  foreshortening of the fracture fragments at least 3 cm. No intra-articular extension. Electronically Signed   By: Simonne Come M.D.   On: 03/24/2023 14:39   DG Chest Port 1 View  Result Date: 03/24/2023 CLINICAL DATA:  Tripped and fell.  Pain EXAM: PORTABLE CHEST 1 VIEW COMPARISON:  Chest x-ray 08/14/2022 and CTA FINDINGS: Hyperinflation. Apical pleural thickening with chronic lung changes. No consolidation, pneumothorax or effusion. No edema. Normal cardiopericardial silhouette. Overlapping cardiac leads. Degenerative changes along the spine. The right lower lobe pulmonary nodule seen by CT is not well identified by x-ray. Please see prior recommendation. IMPRESSION: Hyperinflation with chronic lung changes. The lung nodule identified by prior CT is not well defined on this x-ray. Please see prior recommendations. Electronically Signed    By: Karen Kays M.D.   On: 03/24/2023 14:28    EKG: Independently reviewed. ***  Time spent: ** minutes  Tereasa Coop MD [facility]  How to contact the [facility] Attending or Consulting provider 7A - 7P or covering provider during after hours 7P -7A, for this patient?   Check the care team in [facility] and look for a) attending/consulting [facility] provider listed and b) the [facility] team listed Log into [email or web address] and use [place]'s universal password to access. If you do not have the password, please contact the hospital operator. Locate the [facility] provider you are looking for under [facility] and page to a number that you can be directly reached. If you still have difficulty reaching the provider, please page the [facility] (Director on Call) for the Hospitalists listed on amion for assistance.  03/24/2023, 8:37 PM

## 2023-03-24 NOTE — Consult Note (Signed)
Reason for Consult:Right femur fx Referring Physician: Gwyneth Sprout Time called: 1308 Time at bedside: 1311   [name] is an 82 y.o. female.  HPI: [name] was at home trying to get into her bathroom and lost her balance trying to maneuver her RW. She had immediate right thigh pain and deformity. She was brought to the ED and orthopedic surgery was consulted. Her leg was reduced under CS via EDP and x-rays confirmed a midshaft femur fx. She lives at home with her husband and has been unable to ambulate for the last few weeks 2/2 pain in that right thigh.  Past Medical History:  Diagnosis Date   Actinic keratosis 11/10/2019   Left superior shoulder. Bx proven.   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    Past Surgical History:  Procedure Laterality Date   ABDOMINAL HYSTERECTOMY     partial   CARDIAC CATHETERIZATION     [facility]   cataract     COLONOSCOPY WITH PROPOFOL N/A 06/22/2015   Procedure: COLONOSCOPY WITH PROPOFOL;  Surgeon: Wallace Cullens, MD;  Location: [facility] ENDOSCOPY;  Service: Gastroenterology;  Laterality: N/A;   ESOPHAGOGASTRODUODENOSCOPY (EGD) WITH PROPOFOL N/A 06/22/2015   Procedure: ESOPHAGOGASTRODUODENOSCOPY (EGD) WITH PROPOFOL;  Surgeon: Wallace Cullens, MD;  Location: [facility] ENDOSCOPY;  Service: Gastroenterology;  Laterality: N/A;   ESOPHAGOGASTRODUODENOSCOPY (EGD) WITH PROPOFOL N/A 07/12/2020   Procedure: ESOPHAGOGASTRODUODENOSCOPY (EGD) WITH PROPOFOL;  Surgeon: Toledo, Boykin Nearing, MD;  Location: [facility] ENDOSCOPY;  Service: Gastroenterology;  Laterality: N/A;   PARTIAL HYSTERECTOMY     TONSILLECTOMY      Family History  Problem Relation Age of Onset   Heart failure Mother        CHF   Transient ischemic attack Mother    Heart disease Father    Heart  failure Father    Coronary artery disease Other    Breast cancer Maternal Aunt 34    Social History:  reports that she has never smoked. She has been exposed to tobacco smoke. She has never used smokeless tobacco. She reports that she does not drink alcohol and does not use drugs.  Allergies:  Allergies  Allergen Reactions   Bisphosphonates    Boniva [Ibandronic Acid]    Fosamax [Alendronate Sodium]    Lipitor [Atorvastatin]    Etodolac Rash    Rash     Medications: I have reviewed the patient's current medications.  Results for orders placed or performed during the hospital encounter of 03/24/23 (from the past 48 hour(s))  Type and screen [facility]     Status: None (Preliminary result)   Collection Time: 03/24/23  2:00 PM  Result Value Ref Range   ABO/RH(D) PENDING    Antibody Screen PENDING    Sample Expiration      03/27/2023,2359 Performed at [facility], [address] [place], [place] [address]     DG FEMUR, MIN 2 VIEWS RIGHT  Result Date: 03/24/2023 CLINICAL DATA:  Trip and fall, obvious femur deformity. EXAM: RIGHT FEMUR 2 VIEWS COMPARISON:  None Available. FINDINGS: There is a complete fracture of the midshaft of the femur with foreshortening of the fracture fragments at least 3 cm with proximal component apex anteromedial. No intra-articular extension. Mild degenerative  change of the right hip is suspected with joint space loss and subchondral sclerosis. No evidence of avascular necrosis. Limited visualization of the knee appears normal given obliquity and field of view. No radiopaque foreign body. IMPRESSION: Complete fracture of the midshaft of the femur with foreshortening of the fracture fragments at least 3 cm. No intra-articular extension. Electronically Signed   By: Simonne Come M.D.   On: 03/24/2023 14:39   DG Chest Port 1 View  Result Date: 03/24/2023 CLINICAL DATA:  Tripped and fell.  Pain EXAM: PORTABLE CHEST 1 VIEW COMPARISON:   Chest x-ray 08/14/2022 and CTA FINDINGS: Hyperinflation. Apical pleural thickening with chronic lung changes. No consolidation, pneumothorax or effusion. No edema. Normal cardiopericardial silhouette. Overlapping cardiac leads. Degenerative changes along the spine. The right lower lobe pulmonary nodule seen by CT is not well identified by x-ray. Please see prior recommendation. IMPRESSION: Hyperinflation with chronic lung changes. The lung nodule identified by prior CT is not well defined on this x-ray. Please see prior recommendations. Electronically Signed   By: Karen Kays M.D.   On: 03/24/2023 14:28    Review of Systems  HENT:  Negative for ear discharge, ear pain, hearing loss and tinnitus.   Eyes:  Negative for photophobia and pain.  Respiratory:  Negative for cough and shortness of breath.   Cardiovascular:  Negative for chest pain.  Gastrointestinal:  Negative for abdominal pain, nausea and vomiting.  Genitourinary:  Negative for dysuria, flank pain, frequency and urgency.  Musculoskeletal:  Positive for arthralgias (Right thigh). Negative for back pain, myalgias and neck pain.  Neurological:  Negative for dizziness and headaches.  Hematological:  Does not bruise/bleed easily.  Psychiatric/Behavioral:  The patient is not nervous/anxious.    Blood pressure 128/64, pulse 80, resp. rate 16, height 5\' 4"  (1.626 m), weight 46.7 kg, SpO2 100%. Physical Exam Constitutional:      General: She is not in acute distress.    Appearance: She is well-developed. She is not diaphoretic.  HENT:     Head: Normocephalic and atraumatic.  Eyes:     General: No scleral icterus.       Right eye: No discharge.        Left eye: No discharge.     Conjunctiva/sclera: Conjunctivae normal.  Cardiovascular:     Rate and Rhythm: Normal rate and regular rhythm.  Pulmonary:     Effort: Pulmonary effort is normal. No respiratory distress.  Musculoskeletal:     Cervical back: Normal range of motion.      Comments: RLE No traumatic wounds, ecchymosis, or rash  Gross deformity to prox thigh (improved after reduction)  No knee or ankle effusion  Knee stable to varus/ valgus and anterior/posterior stress  Sens DPN, SPN, TN intact  Motor EHL, ext, flex, evers 5/5  DP 2+, PT 2+, No significant edema  Skin:    General: Skin is warm and dry.  Neurological:     Mental Status: She is alert.  Psychiatric:        Mood and Affect: Mood normal.        Behavior: Behavior normal.     Assessment/Plan: Right femur fx -- Plan IMN tonight with Dr. [name]. Please keep NPO.    Freeman Caldron, PA-C Orthopedic Surgery [phone] 03/24/2023, 2:44 PM

## 2023-03-24 NOTE — H&P (Signed)
History and Physical    [name]:[id number] DOB: 1941/03/09 DOA: 03/24/2023  PCP: [name], MD   Patient coming from: Home   Chief Complaint:  Chief Complaint  Patient presents with   Fall   Leg Injury   Head Injury    HPI:  [name] is a 82 y.o. female with medical history significant of   essential hypertension, chronic mitral regurgitation, diverticulosis, esophageal web, GERD, history of skin cancer, hyperlipidemia, low vitamin D, chronic osteoporosis, chronic pulmonary nodule and chronic bilateral lower extremity edema.    Patient reported she was trying to get into bathroom and lost her balance fell on the ground.  She immediately developed right-sided thigh pain and deformity.  Patient was brought to ED by EMS for evaluation.  Orthopedic consulted in the ED peon her leg was reduced under CS via EDP and x-ray confirmed midshaft femoral fracture.  Patient underwent surgery internal fixation by orthopedics tonight around 7:30 PM 03/24/2023.  Orthopedic recommended  weightbearing as tolerated to the right lower extremity. She will receive postoperative Ancef. She will receive Lovenox for DVT prophylaxis and discharged on aspirin 325 mg. We will have her mobilize with physical and Occupational Therapy.    Evaluated patient at PACU postop.  Patient denies any pain of the right hip now.  She is feeling cold.  Patient reporting she has poor appetite and food intake for last 2 to 3 days.  Denies any chest pain, palpitation, shortness of breath, headache, abdominal pain, nausea and vomiting.   ED Course:  Initial presentation to ED heart rate 76, respiratory 22, blood pressure 133/69 and O2 sat 99% on 2 L oxygen.  Postop patient's vitals are stable heart rate 89, respiratory rate 20, blood pressure 151/81 and O2 sat 90% on 3 L oxygen.   Initial lab work showed sodium low sodium 128, potassium 3.5, chloride 95, blood glucose 9017, BUN 9, creatinine 0.69 and calcium 8  7.  GFR above 60 anion gap 10.  CBC WBC 8.3, hemoglobin 11.1 and hematocrit 32, platelet 352.  Imaging, Chest x-ray hyper infiltration with chronic lung change.  No consolidation or pneumothorax.  Normal cardiac silhouette. X-ray right femur showed complete fracture of the midshaft of femur with foreshortening of the fracture fragments at least 3 cm. No intra-articular extension. CT head no acute intracranial finding.  Chronic brain atrophy. CT right femur acute displaced fracture of right femoral diaphysis as described above. No underlying lytic or sclerotic lesion is identified to suggest a pathologic fracture at this location.  At bedside around 9 PM most recent vitals showed heart rate 92, respiratory rate 17, blood pressure 119/68 and O2 sat 100% on 2 L oxygen.  Hospitalist team has been contacted to admit patient for postop care and chronic medical management under hospitalist service.  Review of Systems:  Review of Systems  Constitutional:  Negative for chills and fever.  Eyes:  Negative for blurred vision and double vision.  Respiratory:  Negative for cough.   Cardiovascular:  Negative for chest pain, palpitations and orthopnea.  Gastrointestinal:  Negative for abdominal pain, heartburn, nausea and vomiting.  Genitourinary:  Negative for dysuria and urgency.  Musculoskeletal:  Negative for myalgias and neck pain.  Neurological:  Negative for dizziness, tingling, tremors and headaches.  Psychiatric/Behavioral:  The patient is not nervous/anxious.     Past Medical History:  Diagnosis Date   Actinic keratosis 11/10/2019   Left superior shoulder. Bx proven.   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 SCCis    Past Surgical History:  Procedure Laterality Date   ABDOMINAL HYSTERECTOMY     partial   CARDIAC CATHETERIZATION     [facility]   cataract     COLONOSCOPY WITH PROPOFOL N/A 06/22/2015   Procedure: COLONOSCOPY WITH PROPOFOL;  Surgeon: Wallace Cullens, MD;  Location: [facility] ENDOSCOPY;  Service: Gastroenterology;  Laterality: N/A;   ESOPHAGOGASTRODUODENOSCOPY (EGD) WITH PROPOFOL N/A 06/22/2015   Procedure: ESOPHAGOGASTRODUODENOSCOPY (EGD) WITH PROPOFOL;  Surgeon: Wallace Cullens, MD;  Location: [facility] ENDOSCOPY;  Service: Gastroenterology;  Laterality: N/A;   ESOPHAGOGASTRODUODENOSCOPY (EGD) WITH PROPOFOL N/A 07/12/2020   Procedure: ESOPHAGOGASTRODUODENOSCOPY (EGD) WITH PROPOFOL;  Surgeon: Toledo, Boykin Nearing, MD;  Location: [facility] ENDOSCOPY;  Service: Gastroenterology;  Laterality: N/A;   PARTIAL HYSTERECTOMY     TONSILLECTOMY       reports that she has never smoked. She has been exposed to tobacco smoke. She has never used smokeless tobacco. She reports that she does not drink alcohol and does not use drugs.  Allergies  Allergen Reactions   Bisphosphonates    Boniva [Ibandronic Acid]    Fosamax [Alendronate Sodium]    Lipitor [Atorvastatin]    Etodolac Rash    Rash     Family History  Problem Relation Age of Onset   Heart failure Mother        CHF   Transient ischemic attack Mother    Heart disease Father    Heart failure Father    Coronary artery disease Other    Breast cancer Maternal Aunt 5    Prior to Admission medications   Medication Sig Start Date End Date Taking? Authorizing Provider  aspirin 81 MG EC tablet Take 81 mg by mouth daily.   Yes [provider]  famotidine (PEPCID) 40 MG tablet Take 1 tablet by mouth at bedtime. 09/26/21  Yes [provider]  lovastatin (MEVACOR) 40 MG tablet Take 40 mg by mouth at bedtime.   Yes [provider]  metoprolol succinate (TOPROL-XL) 25 MG 24 hr tablet Take 1 tablet by mouth daily. 03/26/22 03/26/23  Yes [provider]  omeprazole (PRILOSEC) 40 MG capsule Take 40 mg by mouth daily.   Yes [provider]  ALPRAZolam (XANAX) 0.25 MG tablet Take 0.25 mg by mouth at bedtime as needed for anxiety. Patient not taking: Reported on 07/24/2022    [provider]  cholecalciferol (VITAMIN D) 400 UNITS TABS tablet Take 2,000 Units by mouth.    [provider]  Denosumab (PROLIA [place]) Inject into the skin. Semi annual injection    [provider]  Ferrous Fumarate-Vitamin C 65-125 MG TABS Take by mouth.    [provider]  Multiple Vitamin (MULTIVITAMIN) tablet Take 1 tablet by mouth daily.    [provider]  ondansetron (ZOFRAN-ODT) 4 MG disintegrating tablet Take 1 tablet (4 mg total) by mouth every 8 (eight) hours as needed for nausea or vomiting. 08/14/22   Chesley Noon, MD  Tavaborole 5 % SOLN Apply topically daily.    [provider]  triamcinolone (KENALOG) 0.025 % ointment Apply 1 Application topically 2 (two) times daily. For 3-5 days. 09/19/22   Moye, [place],  MD     Physical Exam: Vitals:   03/24/23 2030 03/24/23 2045 03/24/23 2055 03/24/23 2104  BP: (!) 109/58 (!) 111/59 (!) 109/54 119/68  Pulse: 87 83 91 92  Resp: 15 16 12 17   Temp:   99.6 F (37.6 C) 98.6 F (37 C)  TempSrc:    Oral  SpO2: 99% 100% 100% 100%  Weight:      Height:        Physical Exam HENT:     Head: Normocephalic and atraumatic.     Mouth/Throat:     Mouth: Mucous membranes are moist.  Eyes:     Pupils: Pupils are equal, round, and reactive to light.  Cardiovascular:     Rate and Rhythm: Normal rate and regular rhythm.     Pulses: Normal pulses.     Heart sounds: Normal heart sounds.  Pulmonary:     Effort: Pulmonary effort is normal.     Breath sounds: Normal breath sounds.  Abdominal:     General: Bowel sounds are normal.  Musculoskeletal:     Cervical back: Neck supple.     Right lower leg: No edema.     Left lower leg:  No edema.     Comments: Bilateral lower extremities dorsalis pedis pulse 2+.  Skin:    General: Skin is warm.     Capillary Refill: Capillary refill takes less than 2 seconds.  Neurological:     Mental Status: She is alert and oriented to person, place, and time.     Cranial Nerves: No cranial nerve deficit.     Motor: No weakness.  Psychiatric:        Mood and Affect: Mood normal.      Labs on Admission: I have personally reviewed following labs and imaging studies  CBC: Recent Labs  Lab 03/24/23 1310  WBC 8.3  NEUTROABS 6.4  HGB 11.1*  HCT 32.4*  MCV 97.0  PLT 252   Basic Metabolic Panel: Recent Labs  Lab 03/24/23 1310  NA 128*  K 3.5  CL 95*  CO2 23  GLUCOSE 117*  BUN 9  CREATININE 0.67  CALCIUM 8.7*   GFR: Estimated Creatinine Clearance: 40 mL/min (by C-G formula based on SCr of 0.67 mg/dL). Liver Function Tests: No results for input(s): "AST", "ALT", "ALKPHOS", "BILITOT", "PROT", "ALBUMIN" in the last 168 hours. No results for input(s): "LIPASE", "AMYLASE" in the last 168 hours. No results for input(s): "AMMONIA" in the last 168 hours. Coagulation Profile: Recent Labs  Lab 03/24/23 1310  INR 1.0   Cardiac Enzymes: No results for input(s): "CKTOTAL", "CKMB", "CKMBINDEX", "TROPONINI", "TROPONINIHS" in the last 168 hours. BNP (last 3 results) No results for input(s): "BNP" in the last 8760 hours. HbA1C: No results for input(s): "HGBA1C" in the last 72 hours. CBG: No results for input(s): "GLUCAP" in the last 168 hours. Lipid Profile: No results for input(s): "CHOL", "HDL", "LDLCALC", "TRIG", "CHOLHDL", "LDLDIRECT" in the last 72 hours. Thyroid Function Tests: No results for input(s): "TSH", "T4TOTAL", "FREET4", "T3FREE", "THYROIDAB" in the last 72 hours. Anemia Panel: No results for input(s): "VITAMINB12", "FOLATE", "FERRITIN", "TIBC", "IRON", "RETICCTPCT" in the last 72 hours. Urine analysis: No results found for: "COLORURINE", "APPEARANCEUR",  "LABSPEC", "PHURINE", "GLUCOSEU", "HGBUR", "BILIRUBINUR", "KETONESUR", "PROTEINUR", "UROBILINOGEN", "NITRITE", "LEUKOCYTESUR"  Radiological Exams on Admission: I have personally reviewed images DG C-Arm 1-60 Min-No Report  Result Date: 03/24/2023 Fluoroscopy was utilized by the requesting physician.  No radiographic interpretation.   CT FEMUR RIGHT WO CONTRAST  Result Date:  03/24/2023 CLINICAL DATA:  Right femur fracture EXAM: CT OF THE LOWER RIGHT EXTREMITY WITHOUT CONTRAST TECHNIQUE: Multidetector CT imaging of the right lower extremity was performed according to the standard protocol. RADIATION DOSE REDUCTION: This exam was performed according to the departmental dose-optimization program which includes automated exposure control, adjustment of the mA and/or kV according to patient size and/or use of iterative reconstruction technique. COMPARISON:  X-ray 03/24/2023 FINDINGS: Bones/Joint/Cartilage Acute mid shaft fracture of the right femur without comminution. There is 1 shaft width of posterior displacement and approximately 2.2 cm of fracture overlap/shortening. No underlying lytic or sclerotic lesion is identified to suggest a pathologic fracture at this location. Remainder of the femur is otherwise intact. No additional fractures. No dislocation at the hip or knee. 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 Ligaments Suboptimally assessed by CT. Muscles and Tendons No acute musculotendinous abnormality by CT. Soft tissues No organized hematoma.  No right inguinal lymphadenopathy. IMPRESSION: Acute displaced fracture of the right femoral diaphysis, as described. No underlying lytic or sclerotic lesion is identified to suggest a pathologic fracture at this location. Electronically Signed   By: Duanne Guess D.O.   On: 03/24/2023 15:46   CT HEAD WO CONTRAST  Result Date: 03/24/2023 CLINICAL DATA:  Trauma, fall EXAM: CT HEAD WITHOUT CONTRAST TECHNIQUE: Contiguous axial images were obtained  from the base of the skull through the vertex without intravenous contrast. RADIATION DOSE REDUCTION: This exam was performed according to the departmental dose-optimization program which includes automated exposure control, adjustment of the mA and/or kV according to patient size and/or use of iterative reconstruction technique. COMPARISON:  None Available. FINDINGS: Brain: No acute intracranial findings are seen. There are no signs of bleeding within the cranium. Cortical sulci are prominent. Ventricles are not dilated. Vascular: Unremarkable. Skull: No fracture is seen in calvarium. Sinuses/Orbits: Unremarkable. Other: None. IMPRESSION: No acute intracranial findings are seen in noncontrast CT brain. Atrophy. Electronically Signed   By: Ernie Avena M.D.   On: 03/24/2023 15:27   DG FEMUR, MIN 2 VIEWS RIGHT  Result Date: 03/24/2023 CLINICAL DATA:  Trip and fall, obvious femur deformity. EXAM: RIGHT FEMUR 2 VIEWS COMPARISON:  None Available. FINDINGS: There is a complete fracture of the midshaft of the femur with foreshortening of the fracture fragments at least 3 cm with proximal component apex anteromedial. No intra-articular extension. Mild degenerative change of the right hip is suspected with joint space loss and subchondral sclerosis. No evidence of avascular necrosis. Limited visualization of the knee appears normal given obliquity and field of view. No radiopaque foreign body. IMPRESSION: Complete fracture of the midshaft of the femur with foreshortening of the fracture fragments at least 3 cm. No intra-articular extension. Electronically Signed   By: Simonne Come M.D.   On: 03/24/2023 14:39   DG Chest Port 1 View  Result Date: 03/24/2023 CLINICAL DATA:  Tripped and fell.  Pain EXAM: PORTABLE CHEST 1 VIEW COMPARISON:  Chest x-ray 08/14/2022 and CTA FINDINGS: Hyperinflation. Apical pleural thickening with chronic lung changes. No consolidation, pneumothorax or effusion. No edema. Normal  cardiopericardial silhouette. Overlapping cardiac leads. Degenerative changes along the spine. The right lower lobe pulmonary nodule seen by CT is not well identified by x-ray. Please see prior recommendation. IMPRESSION: Hyperinflation with chronic lung changes. The lung nodule identified by prior CT is not well defined on this x-ray. Please see prior recommendations. Electronically Signed   By: Karen Kays M.D.   On: 03/24/2023 14:28    EKG: My personal  interpretation of EKG shows: EKG showed sinus rhythm with premature atrial complex.  No ST and T wave abnormality    Assessment/Plan: Principal Problem:   Right femoral shaft fracture s/p reduction and internal fixation 03/24/2023 [facility]) Active Problems:   Hyponatremia   Essential hypertension   Chronic mitral valve regurgitation   Bilateral lower extremity edema    Assessment and Plan: Acute displaced right femoral shaft fracture s/p reduction internal fixation on 03/24/2023 Mechanic fall -Patient sustained mechanical fall from imbalance and developed right-sided hip pain.  Came to ED for evaluation.  Extensive imaging and workup showed no acute abnormality or fracture except x-ray right femur showed complete fracture of midshaft of the femur with foreshortening of the femur fragment at least 3 cm.  No intra-articular extension.  MRI of the femur acute displaced fracture of right femoral diaphysis. -Patient underwent conscious sedation followed by reduction in the ED by EDP.  Afterward orthopedic surgeon has taken patient to the OR.  Patient underwent internal fixation with 2 screw placement tonight 03/24/2023. - Orthopedic surgeon Dr. [name] recommended weightbearing as tolerated to the right lower extremity. She will receive postoperative Ancef. She will receive Lovenox for DVT prophylaxis and discharged on aspirin 325 mg. We will have her mobilize with physical and Occupational Therapy.  Appreciate orthopedic input. -Currently on Lovenox 40  mg IV daily and on discharge will switch to oral aspirin 325 mg daily. -Continue on Ancef 2 g 3 times daily to complete 3 days course for surgical prophylaxis. -Consulted PT, OT for evaluation. - Continue weightbearing as patient tolerates. - Continue pain management by orthopedics. -Continue fall precaution.   Euvolemic hyponatremia -On presentation sodium 128.  Patient reported poor oral intake for last few days due to poor appetite.  Denies any shortness of breath.physical exam no lower extremity swelling appreciated, no JVD, rales and rhonchi.  No concern for CHF exacerbation.   -Hyponatremia secondary from combination of poor oral intake and in the setting activation of ADH of pain response. -Pressure borderline low. -Continue gentle hydration NS 50 cc/h for 1 day.  Encouraged oral solute and protein intake - Checking serum osmolarity, urine osmolarity and urine sodium - Continue to check BMP every 8  hours for 1 day.   Essential hypertension Chronic mitral valve insufficiency/regurgitation Moderate tricuspid regurgitation Chronic bilateral lower extremity edema-resolved -Patient has history of mitral valve regurgitation.  Per chart review previous echocardiogram from 08/15/2022 EF about 55%, mild mitral valve regurgitation, moderate tricuspid regurgitation. -At home patient is on Toprol-XL 25 mg daily and losartan 100 mg daily. - Blood pressure is borderline low however patient is tachycardic. Tachycardia likely secondary from pain however EKG showed premature atrial complex and heart rate around 86. - Resuming Toprol-XL with reduced dose 12.5 mg daily.  Will follow-up with EKG. -Will restart blood pressure regimen as appropriate.   Chronic iron deficiency anemia -Hemoglobin 11 and hematocrit 32.  Baseline hemoglobin around 12.4. -At home patient is iron supplement - Holding oral iron supplement to to prevent constipation in the setting of recent surgery -Continue monitor  CBC  DVT prophylaxis:  Lovenox Code Status:  Full Code Diet: Regular diet  Family Communication: Discussed treatment plan with patient Disposition Plan: Based on PT and OT evaluation plan to discharge in 3 to 4 days for skilled nursing facility placement Consults: Orthopedics, PT and OT. Admission status:   Inpatient, Med-Surg  Severity of Illness: The appropriate patient status for this patient is INPATIENT. Inpatient status is judged to be reasonable  and necessary in order to provide the required intensity of service to ensure the patient's safety. The patient's presenting symptoms, physical exam findings, and initial radiographic and laboratory data in the context of their chronic comorbidities is felt to place them at high risk for further clinical deterioration. Furthermore, it is not anticipated that the patient will be medically stable for discharge from the hospital within 2 midnights of admission.   * I certify that at the point of admission it is my clinical judgment that the patient will require inpatient hospital care spanning beyond 2 midnights from the point of admission due to high intensity of service, high risk for further deterioration and high frequency of surveillance required.Marland Kitchen    Tereasa Coop, MD [facility]  How to contact the [facility] Attending or Consulting provider 7A - 7P or covering provider during after hours 7P -7A, for this patient.  Check the care team in [facility] and look for a) attending/consulting [facility] provider listed and b) the [facility] team listed Log into [email or web address] and use 's universal password to access. If you do not have the password, please contact the hospital operator. Locate the [place] provider you are looking for under [facility] and page to a number that you can be directly reached. If you still have difficulty reaching the provider, please page the [facility] (Director on Call) for the Hospitalists listed on amion for  assistance.  03/24/2023, 9:33 PM

## 2023-03-24 NOTE — Interval H&P Note (Signed)
History and Physical Interval Note:  03/24/2023 4:01 PM  [name]  has presented today for surgery, with the diagnosis of Right femur fracture.  The various methods of treatment have been discussed with the patient and family. After consideration of risks, benefits and other options for treatment, the patient has consented to  Procedure(s): INTRAMEDULLARY (IM) NAIL FEMUR (Right) as a surgical intervention.  The patient's history has been reviewed, patient examined, no change in status, stable for surgery.  I have reviewed the patient's chart and labs.  Questions were answered to the patient's satisfaction.     Caryn Bee P Lysa Livengood

## 2023-03-25 DIAGNOSIS — S72301A Unspecified fracture of shaft of right femur, initial encounter for closed fracture: Secondary | ICD-10-CM | POA: Diagnosis not present

## 2023-03-25 DIAGNOSIS — D509 Iron deficiency anemia, unspecified: Secondary | ICD-10-CM | POA: Diagnosis present

## 2023-03-25 LAB — BASIC METABOLIC PANEL
Anion gap: 11 (ref 5–15)
Anion gap: 11 (ref 5–15)
BUN: 8 mg/dL (ref 8–23)
BUN: 9 mg/dL (ref 8–23)
CO2: 24 mmol/L (ref 22–32)
CO2: 24 mmol/L (ref 22–32)
Calcium: 7.9 mg/dL — ABNORMAL LOW (ref 8.9–10.3)
Calcium: 7.8 mg/dL — ABNORMAL LOW (ref 8.9–10.3)
Chloride: 92 mmol/L — ABNORMAL LOW (ref 98–111)
Chloride: 91 mmol/L — ABNORMAL LOW (ref 98–111)
Creatinine, Ser: 0.56 mg/dL (ref 0.44–1.00)
Creatinine, Ser: 0.62 mg/dL (ref 0.44–1.00)
GFR, Estimated: >60 mL/min (ref >60)
GFR, Estimated: >60 mL/min (ref >60)
Glucose, Bld: 142 mg/dL — ABNORMAL HIGH (ref 70–99)
Glucose, Bld: 153 mg/dL — ABNORMAL HIGH (ref 70–99)
Potassium: 3.7 mmol/L (ref 3.5–5.1)
Potassium: 3.9 mmol/L (ref 3.5–5.1)
Sodium: 127 mmol/L — ABNORMAL LOW (ref 135–145)
Sodium: 126 mmol/L — ABNORMAL LOW (ref 135–145)

## 2023-03-25 LAB — CBC
HCT: 25 % — ABNORMAL LOW (ref 36.0–46.0)
Hemoglobin: 8.6 g/dL — ABNORMAL LOW (ref 12.0–15.0)
MCH: 32.2 pg (ref 26.0–34.0)
MCHC: 34.4 g/dL (ref 30.0–36.0)
MCV: 93.6 fL (ref 80.0–100.0)
Platelets: 242 10*3/uL (ref 150–400)
RBC: 2.67 MIL/uL — ABNORMAL LOW (ref 3.87–5.11)
RDW: 12.3 % (ref 11.5–15.5)
WBC: 9.1 10*3/uL (ref 4.0–10.5)
nRBC: 0 % (ref 0.0–0.2)

## 2023-03-25 LAB — VITAMIN D 25 HYDROXY (VIT D DEFICIENCY, FRACTURES): Vit D, 25-Hydroxy: 27.74 ng/mL — ABNORMAL LOW (ref 30–100)

## 2023-03-25 MED ORDER — PRAVASTATIN SODIUM 40 MG PO TABS
40.0000 mg | ORAL_TABLET | Freq: Every day | ORAL | Status: DC
  Administered 2023-03-25 – 2023-04-03 (×10): 40 mg via ORAL
  Filled 2023-03-25 (×10): qty 1

## 2023-03-25 NOTE — Anesthesia Postprocedure Evaluation (Signed)
Anesthesia Post Note  Patient: [name]  Procedure(s) Performed: INTRAMEDULLARY (IM) NAIL FEMUR (Right: Leg Upper)     Patient location during evaluation: PACU Anesthesia Type: MAC and Spinal Level of consciousness: awake and alert Pain management: pain level controlled Vital Signs Assessment: post-procedure vital signs reviewed and stable Respiratory status: spontaneous breathing and respiratory function stable Cardiovascular status: blood pressure returned to baseline and stable Postop Assessment: spinal receding Anesthetic complications: no  No notable events documented.  Last Vitals:  Vitals:   03/24/23 2055 03/24/23 2104  BP: (!) 109/54 119/68  Pulse: 91 92  Resp: 12 17  Temp: 37.6 C 37 C  SpO2: 100% 100%    Last Pain:  Vitals:   03/24/23 2308  TempSrc:   PainSc: Asleep                 [name]

## 2023-03-25 NOTE — Progress Notes (Signed)
Orthopaedic Trauma Progress Note  SUBJECTIVE: Doing ok this morning, pain fairly well controlled on current regimen. Recently received her pain medication and muscle relaxer.  Noted some discomfort in the right hip when she sat up in bed this morning.  We reviewed expectations regarding pain and muscle weakness in the leg and this acute postoperative period given her injury and surgery.  Patient has not been up out of bed yet since surgery.  Denies any significant numbness or tingling throughout the right lower extremity.  No chest pain. No SOB. No nausea/vomiting. No other complaints.   OBJECTIVE:  Vitals:   03/25/23 0419 03/25/23 0743  BP: (!) 106/59 (!) 106/50  Pulse: 80 91  Resp: 17 17  Temp: 98.4 F (36.9 C) 98.4 F (36.9 C)  SpO2: 96% 97%    General: Sitting up in bed, no acute distress.  Pleasant and cooperative.  Alert and oriented x 3 Respiratory: No increased work of breathing.  RLE: Dressings clean, dry, intact.  Tender over the lateral hip and thigh as expected.  No significant tenderness about the knee or throughout the lower leg.  No calf tenderness.  Ankle dorsiflexion/plantarflexion is intact.  Able to wiggle toes.  Endorses sensation distally.  Compartment soft compressible.  Skin warm and dry. + DP pulse  IMAGING: Stable post op imaging.   LABS:  Results for orders placed or performed during the hospital encounter of 03/24/23 (from the past 24 hour(s))  Basic metabolic panel     Status: Abnormal   Collection Time: 03/24/23  1:10 PM  Result Value Ref Range   Sodium 128 (L) 135 - 145 mmol/L   Potassium 3.5 3.5 - 5.1 mmol/L   Chloride 95 (L) 98 - 111 mmol/L   CO2 23 22 - 32 mmol/L   Glucose, Bld 117 (H) 70 - 99 mg/dL   BUN 9 8 - 23 mg/dL   Creatinine, Ser 1.61 0.44 - 1.00 mg/dL   Calcium 8.7 (L) 8.9 - 10.3 mg/dL   GFR, Estimated >09 >60 mL/min   Anion gap 10 5 - 15  CBC with Differential     Status: Abnormal   Collection Time: 03/24/23  1:10 PM  Result Value Ref  Range   WBC 8.3 4.0 - 10.5 K/uL   RBC 3.34 (L) 3.87 - 5.11 MIL/uL   Hemoglobin 11.1 (L) 12.0 - 15.0 g/dL   HCT 45.4 (L) 09.8 - 11.9 %   MCV 97.0 80.0 - 100.0 fL   MCH 33.2 26.0 - 34.0 pg   MCHC 34.3 30.0 - 36.0 g/dL   RDW 14.7 82.9 - 56.2 %   Platelets 252 150 - 400 K/uL   nRBC 0.0 0.0 - 0.2 %   Neutrophils Relative % 76 %   Neutro Abs 6.4 1.7 - 7.7 K/uL   Lymphocytes Relative 11 %   Lymphs Abs 0.9 0.7 - 4.0 K/uL   Monocytes Relative 9 %   Monocytes Absolute 0.7 0.1 - 1.0 K/uL   Eosinophils Relative 2 %   Eosinophils Absolute 0.2 0.0 - 0.5 K/uL   Basophils Relative 1 %   Basophils Absolute 0.1 0.0 - 0.1 K/uL   Immature Granulocytes 1 %   Abs Immature Granulocytes 0.06 0.00 - 0.07 K/uL  Protime-INR     Status: None   Collection Time: 03/24/23  1:10 PM  Result Value Ref Range   Prothrombin Time 13.6 11.4 - 15.2 seconds   INR 1.0 0.8 - 1.2  Type and screen [facility]     Status: None   Collection Time: 03/24/23  2:00 PM  Result Value Ref Range   ABO/RH(D) A POS    Antibody Screen NEG    Sample Expiration      03/27/2023,2359 Performed at [facility], [address] [place], [place] [address]   Surgical pcr screen     Status: None   Collection Time: 03/24/23  3:02 PM   Specimen: Nasal Mucosa; Nasal Swab  Result Value Ref Range   MRSA, PCR NEGATIVE NEGATIVE   Staphylococcus aureus NEGATIVE NEGATIVE  ABO/Rh     Status: None   Collection Time: 03/24/23  9:58 PM  Result Value Ref Range   ABO/RH(D)      A POS Performed at [facility], [address], [place], [place] [address]   Basic metabolic panel     Status: Abnormal   Collection Time: 03/24/23  9:58 PM  Result Value Ref Range   Sodium 126 (L) 135 - 145 mmol/L   Potassium 3.5 3.5 - 5.1 mmol/L   Chloride 94 (L) 98 - 111 mmol/L   CO2 20 (L) 22 - 32 mmol/L   Glucose, Bld 147 (H) 70 - 99 mg/dL   BUN 9 8 - 23 mg/dL   Creatinine, Ser 6.44 0.44 - 1.00 mg/dL   Calcium 8.0 (L) 8.9 - 10.3  mg/dL   GFR, Estimated >03 >47 mL/min   Anion gap 12 5 - 15  CBC     Status: Abnormal   Collection Time: 03/24/23  9:58 PM  Result Value Ref Range   WBC 9.2 4.0 - 10.5 K/uL   RBC 2.70 (L) 3.87 - 5.11 MIL/uL   Hemoglobin 8.7 (L) 12.0 - 15.0 g/dL   HCT 42.5 (L) 95.6 - 38.7 %   MCV 93.3 80.0 - 100.0 fL   MCH 32.2 26.0 - 34.0 pg   MCHC 34.5 30.0 - 36.0 g/dL   RDW 56.4 33.2 - 95.1 %   Platelets 228 150 - 400 K/uL   nRBC 0.0 0.0 - 0.2 %  Osmolality     Status: Abnormal   Collection Time: 03/24/23  9:58 PM  Result Value Ref Range   Osmolality 267 (L) 275 - 295 mOsm/kg  Hepatic function panel     Status: Abnormal   Collection Time: 03/24/23  9:58 PM  Result Value Ref Range   Total Protein 5.3 (L) 6.5 - 8.1 g/dL   Albumin 3.0 (L) 3.5 - 5.0 g/dL   AST 21 15 - 41 U/L   ALT 12 0 - 44 U/L   Alkaline Phosphatase 43 38 - 126 U/L   Total Bilirubin 0.6 0.3 - 1.2 mg/dL   Bilirubin, Direct 0.2 0.0 - 0.2 mg/dL   Indirect Bilirubin 0.4 0.3 - 0.9 mg/dL  TSH     Status: None   Collection Time: 03/24/23  9:58 PM  Result Value Ref Range   TSH 0.702 0.350 - 4.500 uIU/mL    ASSESSMENT: [name] is a 82 y.o. female, 1 Day Post-Op s/p INTRAMEDULLARY NAIL RIGHT FEMORAL SHAFT FRACTURE   CV/Blood loss: Hgb 8.7 on 03/24/23. CBC pending this AM. Hemodynamically stable  PLAN: Weightbearing: WBAT RLE ROM:  Ok for hip and knee motion as tolerated  Incisional and dressing care: Reinforce dressings as needed  Showering: Ok to get incisions wet starting 03/27/23 Orthopedic device(s): None  Pain management:  1. Tylenol 325-650 mg q 6 hours PRN 2. Robaxin 500 mg q 6  hours PRN 3. Norco 5-325 mg OR 7.5-325 mg q 4 hours PRN 4. Morphine 0.5-1 mg q 2 hours PRN VTE prophylaxis: Lovenox, SCDs ID:  Ancef 2gm post op Foley/Lines:  No foley, KVO IVFs Impediments to Fracture Healing: Vit D level pending. Will start supplementation as indicated Dispo: PT/OT eval today. Dispo pending.  Plan to remove  dressing RLE 03/26/23  D/C recommendations: -Hydrocodone and Robaxin for pain control -Aspirin 325 mg daily x 30 days for DVT prophylaxis -Possible need for Vit D supplementation  Follow - up plan: 2 weeks after discharge for wound check and repeat x-rays   Contact information:  Truitt Merle MD, Thyra Breed PA-C. After hours and holidays please check [email or web address] for group call information for [facility]   Thompson Caul, PA-C [phone] (office) [email or web address]

## 2023-03-25 NOTE — Hospital Course (Addendum)
Mrs. Leachman is an 82 y.o. F with HTN, HLD who presented after a mechanical fall and was found to have R midshaft femoral fracture.    S/p IM nail on 7/22.  Hospital stay complicated by hyponatremia, recalcitrant, and new onset A-fib.

## 2023-03-25 NOTE — Progress Notes (Addendum)
Progress Note   Patient: [name] ZOX:[id number] DOB: 08-Jan-1941 DOA: 03/24/2023     1 DOS: the patient was seen and examined on 03/25/2023   Brief hospital course: 82yo female with h/o HTN and HLD who presented on 7/22 with a mechanical fall.  She was found to have a R midshaft femoral fracture and underwent intramedullary nailing.    Assessment and Plan:  R Femur fracture Apparently mechanical fall resulting in femur fracture MRI of the femur acute displaced fracture of right femoral diaphysis Orthopedics consulted Patient underwent conscious sedation followed by reduction in the ED by EDP Underwent surgical repair on 7/22 Pain control with Tylenol, Robaxin, Norco, and Morphine prn TOC team consult for rehab placement PT/OT consulting Orthopedic surgeon Dr. [name] recommended weightbearing as tolerated to the right lower extremity.  She will receive Lovenox for DVT prophylaxis and will be discharged on aspirin 325 mg   Euvolemic hyponatremia On presentation sodium 128 Patient reported poor oral intake for last few days due to poor appetite Hyponatremia secondary from combination of poor oral intake and in the setting activation of ADH of pain response. No improvement with IVF but this appears to be stable Recheck BMP in AM    Essential hypertension Echo on 08/15/2022 with EF about 55%, mild mitral valve regurgitation, moderate tricuspid regurgitation At home patient is on Toprol-XL 25 mg daily and losartan 100 mg daily. On admission, resumed Toprol-XL with reduced dose 12.5 mg daily Holding losartan for now    Chronic iron deficiency anemia Baseline hemoglobin around 12.4 Hemoglobin 11 on presentation, currently 8.6.   Holding home oral iron supplement to to prevent constipation in the setting of recent surgery Continue monitor CBC  HLD Continue lovastatin (formulary change to pravastatin)    Consultants: Orthopedics PT/OT TOC team  Procedures: R IM  nailing of midshaft femur fracture 7/22    30 Day Unplanned Readmission Risk Score    Flowsheet Row ED to Hosp-Admission (Current) from 03/24/2023 in [facility]  30 Day Unplanned Readmission Risk Score (%) 13.51 Filed at 03/25/2023 0801       This score is the patient's risk of an unplanned readmission within 30 days of being discharged (0 -100%). The score is based on dignosis, age, lab data, medications, orders, and past utilization.   Low:  0-14.9   Medium: 15-21.9   High: 22-29.9   Extreme: 30 and above           Subjective: She reports mechanical fall leading to midshaft femur fracture.  No prior fractures.  Lives at home with husband but thinks she will need SNF rehab (he is an UE amputee).   Objective: Vitals:   03/25/23 0743 03/25/23 1333  BP: (!) 106/50 (!) 113/56  Pulse: 91 81  Resp: 17 16  Temp: 98.4 F (36.9 C) 97.9 F (36.6 C)  SpO2: 97% 93%    Intake/Output Summary (Last 24 hours) at 03/25/2023 1539 Last data filed at 03/25/2023 0600 Gross per 24 hour  Intake 950 ml  Output 600 ml  Net 350 ml   Filed Weights   03/24/23 1323  Weight: 46.7 kg    Exam:  General:  Appears calm and comfortable and is in NAD; hip pain with movement Eyes:  PERRLA, EOMI, normal lids, iris ENT: hard of hearing,  grossly normal lips & tongue, mmm Neck:  no LAD, masses or thyromegaly Cardiovascular:  RRR, no m/r/g. No LE edema.  Respiratory:   CTA bilaterally  with no wheezes/rales/rhonchi.  Normal respiratory effort.   Abdomen:  soft, NT, ND, NABS Skin:  no rash or induration seen on limited exam Musculoskeletal:  R leg is shortened and externally rotated Lower extremity:  No LE edema.  Limited foot exam with no ulcerations.  2+ distal pulses. Psychiatric:  grossly normal mood and affect, speech fluent and appropriate, A&O x 3 Neurologic:  CN 2-12 grossly intact, moves all extremities in coordinated fashion other than RLE  Data  Reviewed: I have reviewed the patient's lab results since admission.  Pertinent labs for today include:  BMP pending WBC 9.1  Hgb 8.6 - stable     Family Communication: None present; I attempted to reach her husband by telephone without success and left a message  Disposition: Status is: Inpatient Remains inpatient appropriate because: postoperative recovery  Planned Discharge Destination: Skilled nursing facility    Time spent: 50 minutes  Author: Jonah Blue, MD 03/25/2023 3:39 PM  For on call review [email or web address].

## 2023-03-25 NOTE — Evaluation (Signed)
Occupational Therapy Evaluation Patient Details Name: [name] MRN: [id number] DOB: August 11, 1941 Today's Date: 03/25/2023   History of Present Illness 81y/o female with hx of Mitral regurg with some chronic right hip pain adm 7/22 s/p ground level fall at home.  S/p Intramedullary nailing of right femoral shaft fracture with WBAT   Clinical Impression   Patient admitted for the diagnosis above.  PTA she lives at home with her spouse, who has memory deficits and back problems.  The patient continues to care for her own ADL, but needs assist with iADL and community mobility.  R hip pain and nausea are the deficits.  Patient is currently needing from [facility] to Max A for ADL completion, and Mod A for simple mobility/transfers.  OT is indicated in the acute setting to address deficits, and Patient will benefit from continued inpatient follow up therapy, <3 hours/day       Recommendations for follow up therapy are one component of a multi-disciplinary discharge planning process, led by the attending physician.  Recommendations may be updated based on patient status, additional functional criteria and insurance authorization.   Assistance Recommended at Discharge Frequent or constant Supervision/Assistance  Patient can return home with the following Help with stairs or ramp for entrance;Assist for transportation;Assistance with cooking/housework;A lot of help with walking and/or transfers;A lot of help with bathing/dressing/bathroom    Functional Status Assessment  Patient has had a recent decline in their functional status and demonstrates the ability to make significant improvements in function in a reasonable and predictable amount of time.  Equipment Recommendations  None recommended by OT    Recommendations for Other Services       Precautions / Restrictions Precautions Precautions: Fall Restrictions Weight Bearing Restrictions: Yes RLE Weight Bearing: Weight bearing as  tolerated      Mobility Bed Mobility Overal bed mobility: Needs Assistance Bed Mobility: Supine to Sit, Sit to Supine     Supine to sit: Mod assist Sit to supine: Mod assist, Max assist        Transfers Overall transfer level: Needs assistance   Transfers: Sit to/from Stand Sit to Stand: Mod assist                  Balance Overall balance assessment: Needs assistance Sitting-balance support: Feet supported, Bilateral upper extremity supported Sitting balance-Leahy Scale: Fair   Postural control: Posterior lean Standing balance support: Reliant on assistive device for balance Standing balance-Leahy Scale: Poor                             ADL either performed or assessed with clinical judgement   ADL Overall ADL's : Needs assistance/impaired Eating/Feeding: Independent;Bed level   Grooming: Wash/dry hands;Wash/dry face;Min guard;Sitting   Upper Body Bathing: Min guard;Sitting   Lower Body Bathing: Maximal assistance;Bed level   Upper Body Dressing : Minimal assistance;Sitting   Lower Body Dressing: Maximal assistance;Bed level   Toilet Transfer: Moderate assistance;Stand-pivot;BSC/3in1;Rolling walker (2 wheels)                   Vision Baseline Vision/History: 1 Wears glasses Patient Visual Report: No change from baseline       Perception     Praxis      Pertinent Vitals/Pain Pain Assessment Pain Assessment: Faces Faces Pain Scale: Hurts little more Pain Location: R hip Pain Descriptors / Indicators: Aching, Grimacing, Guarding Pain Intervention(s): Monitored during session     Hand Dominance  Right   Extremity/Trunk Assessment Upper Extremity Assessment Upper Extremity Assessment: Overall WFL for tasks assessed   Lower Extremity Assessment Lower Extremity Assessment: Defer to PT evaluation   Cervical / Trunk Assessment Cervical / Trunk Assessment: Kyphotic   Communication Communication Communication: No  difficulties   Cognition Arousal/Alertness: Awake/alert Behavior During Therapy: WFL for tasks assessed/performed Overall Cognitive Status: Within Functional Limits for tasks assessed                                       General Comments   vSS on RA    Exercises     Shoulder Instructions      Home Living Family/patient expects to be discharged to:: Private residence Living Arrangements: Spouse/significant other Available Help at Discharge: Family;Available 24 hours/day Type of Home: House Home Access: Stairs to enter [employer] of Steps: 2 Entrance Stairs-Rails: Left Home Layout: One level     Bathroom Shower/Tub: Producer, television/film/video: Standard Bathroom Accessibility: Yes How Accessible: Accessible via walker Home Equipment: Rollator (4 wheels);Wheelchair - manual;Grab bars - tub/shower;Shower seat          Prior Functioning/Environment Prior Level of Function : Needs assist             Mobility Comments: walks with a 4WRW in the house and uses W/c for community mobility.  Spouse will push her in her 4WRW at times. ADLs Comments: Mod I with ADL completion, spouse completes iADL and community mobility.        OT Problem List: Decreased strength;Decreased range of motion;Decreased activity tolerance;Impaired balance (sitting and/or standing);Pain      OT Treatment/Interventions: Self-care/ADL training;Therapeutic activities;Patient/family education;DME and/or AE instruction;Balance training    OT Goals(Current goals can be found in the care plan section) Acute Rehab OT Goals Patient Stated Goal: Make it back home when I can walk OT Goal Formulation: With patient Time For Goal Achievement: 04/08/23 Potential to Achieve Goals: Good ADL Goals Pt Will Perform Grooming: with supervision;standing Pt Will Perform Lower Body Dressing: with supervision;sit to/from stand;with adaptive equipment Pt Will Transfer to Toilet:  with supervision;ambulating;regular height toilet  OT Frequency: Min 1X/week    Co-evaluation              AM-PAC OT "6 Clicks" Daily Activity     Outcome Measure Help from another person eating meals?: None Help from another person taking care of personal grooming?: A Little Help from another person toileting, which includes using toliet, bedpan, or urinal?: A Lot Help from another person bathing (including washing, rinsing, drying)?: A Lot Help from another person to put on and taking off regular upper body clothing?: A Little Help from another person to put on and taking off regular lower body clothing?: A Lot 6 Click Score: 16   End of Session Equipment Utilized During Treatment: Gait belt;Rolling walker (2 wheels) Nurse Communication: Mobility status  Activity Tolerance: Other (comment) (limited by nausea) Patient left: in bed;with call bell/phone within reach  OT Visit Diagnosis: Unsteadiness on feet (R26.81);Pain Pain - Right/Left: Right Pain - part of body: Hip;Leg                Time: 1050-1111 OT Time Calculation (min): 21 min Charges:  OT General Charges $OT Visit: 1 Visit OT Evaluation $OT Eval Moderate Complexity: 1 Mod  03/25/2023  RP, OTR/L  Acute Rehabilitation Services  Office:  [phone]  Kenichi Cassada D Kaitlynne Wenz 03/25/2023, 11:19 AM

## 2023-03-25 NOTE — TOC CAGE-AID Note (Signed)
Transition of Care [facility]) - CAGE-AID Screening   Patient Details  Name: [name] MRN: [id number] Date of Birth: 08-17-1941  Transition of Care [facility]) CM/SW Contact:    Janora Norlander, RN Phone Number: [phone] 03/25/2023, 2:45 PM   Clinical Narrative: Pt here after sustaining a R femur fracture due to a fall.  Pt denies alcohol or drug use.  Screening complete.   CAGE-AID Screening:    Have You Ever Felt You Ought to Cut Down on Your Drinking or Drug Use?: No Have People Annoyed You By Critizing Your Drinking Or Drug Use?: No Have You Felt Bad Or Guilty About Your Drinking Or Drug Use?: No Have You Ever Had a Drink or Used Drugs First Thing In The Morning to Steady Your Nerves or to Get Rid of a Hangover?: No CAGE-AID Score: 0  Substance Abuse Education Offered: No

## 2023-03-25 NOTE — Evaluation (Signed)
Physical Therapy Evaluation Patient Details Name: [name] MRN: [id number] DOB: August 17, 1941 Today's Date: 03/25/2023  History of Present Illness  Pt is an 82 y.o. female who presented 03/24/23 s/p fall, sustaining an acute displaced fracture of the right femoral diaphysis. S/p IM nailing of R femoral shaft fx 7/22. PMH: GERD, osteoporosis, HLD   Clinical Impression  Pt presents with condition above and deficits mentioned below, see PT Problem List. PTA, she was needing some assistance to ambulate with her rollator, reporting she has not ambulated much in the past several weeks. She lives with her husband in a 1-level house with 2 STE. Currently, pt is limited in R hip ROM and strength due to her pain. She was also limited this date by nausea, which she reported worsened when eating and when turning her head. She reports this began after the surgery and she denied any hx of vertigo. Performed a quick vestibular assessment. Saccades were noted with smooth pursuits. Nausea worsened with bil head impulse tests and bil Dix Hallpike tests (modified in bed, worse with L than R). Questionable L beating nystagmus was noted with Gilberto Better testing. However, pt denied spinning dizziness throughout, only reporting nausea. She may have a vestibular dysfunction or it could be nausea related to meds and just having surgery. Will continue to monitor and assess as needed. At this time, pt is requiring minA for bed mobility and modA to transfer to stand. She needed cues to extend her R leg and place her R foot flat on the ground when standing, but she avoided placing weight through her R leg. She was unable to ambulate today. She demonstrates deficits in generalized strength, balance, and activity tolerance and is at high risk for subsequent falls. Will continue to follow acutely. Pt would benefit from short-term inpatient rehab, <3 hours/day.   BP: 111/53 supine 108/51 sitting 105/51 supine after standing       Assistance Recommended at Discharge Intermittent Supervision/Assistance  If plan is discharge home, recommend the following:  Can travel by private vehicle  A lot of help with walking and/or transfers;A lot of help with bathing/dressing/bathroom;Assistance with cooking/housework;Assist for transportation;Help with stairs or ramp for entrance   No    Equipment Recommendations Rolling walker (2 wheels);BSC/3in1  Recommendations for Other Services       Functional Status Assessment Patient has had a recent decline in their functional status and demonstrates the ability to make significant improvements in function in a reasonable and predictable amount of time.     Precautions / Restrictions Precautions Precautions: Fall Restrictions Weight Bearing Restrictions: Yes RLE Weight Bearing: Weight bearing as tolerated      Mobility  Bed Mobility Overal bed mobility: Needs Assistance Bed Mobility: Supine to Sit, Sit to Supine     Supine to sit: Min assist, HOB elevated Sit to supine: Min assist, HOB elevated   General bed mobility comments: Extra time and cues to bring each leg towards and off L EOB, assistance at R leg and at hips to pivot and scoot to EOB. MinA to lift legs onto bed with return to supine.    Transfers Overall transfer level: Needs assistance Equipment used: Rolling walker (2 wheels) Transfers: Sit to/from Stand Sit to Stand: Mod assist           General transfer comment: ModA to shift weight anteriorly and power up to stand from elevated EOB with cues for hand placement. Pt flexing and avoiding placing R foot flat on the ground initially  upon standing.    Ambulation/Gait             Pre-gait activities: Attempted to cue pt to shift weight laterally while supported on RW but pt avoiding shifting and placing weight through her R leg, but was able to extend her R leg and place her foot flat on the ground when cued.    Stairs             Wheelchair Mobility     Tilt Bed    Modified Rankin (Stroke Patients Only)       Balance Overall balance assessment: Needs assistance Sitting-balance support: Feet supported, Bilateral upper extremity supported Sitting balance-Leahy Scale: Poor Sitting balance - Comments: Min guard to sit statically EOB   Standing balance support: Bilateral upper extremity supported, Reliant on assistive device for balance, During functional activity Standing balance-Leahy Scale: Poor Standing balance comment: Reliant on RW and min-modA for static standing                             Pertinent Vitals/Pain Pain Assessment Pain Assessment: Faces Faces Pain Scale: Hurts even more Pain Location: R hip Pain Descriptors / Indicators: Discomfort, Grimacing, Operative site guarding Pain Intervention(s): Limited activity within patient's tolerance, Monitored during session, Repositioned    Home Living Family/patient expects to be discharged to:: Private residence Living Arrangements: Spouse/significant other Available Help at Discharge: Family;Available 24 hours/day Type of Home: House Home Access: Stairs to enter Entrance Stairs-Rails: Left Entrance Stairs-Number of Steps: 2   Home Layout: One level Home Equipment: Rollator (4 wheels);Wheelchair - manual;Grab bars - tub/shower;Shower seat      Prior Function Prior Level of Function : Needs assist             Mobility Comments: walks with a 4WRW in the house and uses W/c for community mobility.  Spouse will push her in her 4WRW at times. ADLs Comments: Mod I with ADL completion, spouse completes iADL and community mobility.     Hand Dominance   Dominant Hand: Right    Extremity/Trunk Assessment   Upper Extremity Assessment Upper Extremity Assessment: Defer to OT evaluation    Lower Extremity Assessment Lower Extremity Assessment: RLE deficits/detail;Generalized weakness RLE Deficits / Details: R hip pain  limiting AROM and strength; denied numbness/tingling    Cervical / Trunk Assessment Cervical / Trunk Assessment: Kyphotic  Communication   Communication: No difficulties  Cognition Arousal/Alertness: Awake/alert Behavior During Therapy: WFL for tasks assessed/performed Overall Cognitive Status: Within Functional Limits for tasks assessed                                          General Comments General comments (skin integrity, edema, etc.): Nausea throughout, but reportedly worse when turning head. Saccades noted with smooth pursuits. Nausea worsened with bil head impulse tests and bil Dix Hallpike tests (modified in bed, worse with L than R). Questionable L beating nystagmus noted with Gilberto Better testing. Denied spinning dizziness though, only reported nausea; BP 111/53 supine, 108/51 sitting, 105/51 supine after standing    Exercises     Assessment/Plan    PT Assessment Patient needs continued PT services  PT Problem List Decreased strength;Decreased range of motion;Decreased balance;Decreased activity tolerance;Decreased mobility;Pain       PT Treatment Interventions DME instruction;Gait training;Stair training;Functional mobility training;Therapeutic activities;Balance training;Therapeutic exercise;Neuromuscular re-education;Patient/family education;Wheelchair mobility  training    PT Goals (Current goals can be found in the Care Plan section)  Acute Rehab PT Goals Patient Stated Goal: to improve PT Goal Formulation: With patient Time For Goal Achievement: 04/08/23 Potential to Achieve Goals: Good    Frequency Min 1X/week     Co-evaluation               AM-PAC PT "6 Clicks" Mobility  Outcome Measure Help needed turning from your back to your side while in a flat bed without using bedrails?: A Little Help needed moving from lying on your back to sitting on the side of a flat bed without using bedrails?: A Little Help needed moving to and from a  bed to a chair (including a wheelchair)?: Total Help needed standing up from a chair using your arms (e.g., wheelchair or bedside chair)?: A Lot Help needed to walk in hospital room?: Total Help needed climbing 3-5 steps with a railing? : Total 6 Click Score: 11    End of Session   Activity Tolerance: Patient limited by pain;Other (comment) (limited by nausea) Patient left: in bed;with call bell/phone within reach;with bed alarm set Nurse Communication: Other (comment) (requesting nausea meds at start of session) PT Visit Diagnosis: Unsteadiness on feet (R26.81);Other abnormalities of gait and mobility (R26.89);Muscle weakness (generalized) (M62.81);History of falling (Z91.81);Difficulty in walking, not elsewhere classified (R26.2);Pain Pain - Right/Left: Right Pain - part of body: Hip    Time: 1610-9604 PT Time Calculation (min) (ACUTE ONLY): 27 min   Charges:   PT Evaluation $PT Eval Moderate Complexity: 1 Mod PT Treatments $Therapeutic Activity: 8-22 mins PT General Charges $$ ACUTE PT VISIT: 1 Visit         Raymond Gurney, PT, DPT Acute Rehabilitation Services  Office: [phone]   Jewel Baize 03/25/2023, 5:11 PM

## 2023-03-26 DIAGNOSIS — E871 Hypo-osmolality and hyponatremia: Secondary | ICD-10-CM | POA: Diagnosis not present

## 2023-03-26 DIAGNOSIS — S72301A Unspecified fracture of shaft of right femur, initial encounter for closed fracture: Secondary | ICD-10-CM | POA: Diagnosis not present

## 2023-03-26 DIAGNOSIS — E785 Hyperlipidemia, unspecified: Secondary | ICD-10-CM

## 2023-03-26 DIAGNOSIS — I10 Essential (primary) hypertension: Secondary | ICD-10-CM | POA: Diagnosis not present

## 2023-03-26 DIAGNOSIS — E559 Vitamin D deficiency, unspecified: Secondary | ICD-10-CM

## 2023-03-26 DIAGNOSIS — D509 Iron deficiency anemia, unspecified: Secondary | ICD-10-CM

## 2023-03-26 LAB — BASIC METABOLIC PANEL
Anion gap: 11 (ref 5–15)
BUN: 7 mg/dL — ABNORMAL LOW (ref 8–23)
CO2: 24 mmol/L (ref 22–32)
Calcium: 7.8 mg/dL — ABNORMAL LOW (ref 8.9–10.3)
Chloride: 91 mmol/L — ABNORMAL LOW (ref 98–111)
Creatinine, Ser: 0.51 mg/dL (ref 0.44–1.00)
GFR, Estimated: >60 mL/min (ref >60)
Glucose, Bld: 111 mg/dL — ABNORMAL HIGH (ref 70–99)
Potassium: 3.7 mmol/L (ref 3.5–5.1)
Sodium: 126 mmol/L — ABNORMAL LOW (ref 135–145)

## 2023-03-26 LAB — CBC
HCT: 23.1 % — ABNORMAL LOW (ref 36.0–46.0)
Hemoglobin: 8.1 g/dL — ABNORMAL LOW (ref 12.0–15.0)
MCH: 32.4 pg (ref 26.0–34.0)
MCHC: 35.1 g/dL (ref 30.0–36.0)
MCV: 92.4 fL (ref 80.0–100.0)
Platelets: 246 10*3/uL (ref 150–400)
RBC: 2.5 MIL/uL — ABNORMAL LOW (ref 3.87–5.11)
RDW: 12.1 % (ref 11.5–15.5)
WBC: 8 10*3/uL (ref 4.0–10.5)
nRBC: 0 % (ref 0.0–0.2)

## 2023-03-26 LAB — SODIUM, URINE, RANDOM: Sodium, Ur: 75 mmol/L

## 2023-03-26 LAB — OSMOLALITY, URINE: Osmolality, Ur: 636 mOsm/kg (ref 300–900)

## 2023-03-26 MED ORDER — METHOCARBAMOL 500 MG PO TABS: 500.0000 mg | ORAL_TABLET | Freq: Four times a day (QID) | ORAL | 0 refills | Status: AC | PRN

## 2023-03-26 MED ORDER — VITAMIN D 25 MCG (1000 UNIT) PO TABS
2000.0000 [IU] | ORAL_TABLET | Freq: Every day | ORAL | Status: DC
  Administered 2023-03-26 – 2023-03-29 (×4): 2000 [IU] via ORAL
  Filled 2023-03-26 (×4): qty 2

## 2023-03-26 MED ORDER — HYDROCODONE-ACETAMINOPHEN 7.5-325 MG PO TABS: 1.0000 | ORAL_TABLET | ORAL | 0 refills | Status: DC | PRN

## 2023-03-26 MED ORDER — VITAMIN D3 25 MCG PO TABS: 2000.0000 [IU] | ORAL_TABLET | Freq: Every day | ORAL | 1 refills | Status: AC

## 2023-03-26 MED ORDER — ONDANSETRON 4 MG PO TBDP: 4.0000 mg | ORAL_TABLET | Freq: Three times a day (TID) | ORAL | 0 refills | Status: DC | PRN

## 2023-03-26 MED ORDER — ASPIRIN 325 MG PO TBEC: 325.0000 mg | DELAYED_RELEASE_TABLET | Freq: Every day | ORAL | 0 refills | Status: DC

## 2023-03-26 MED ORDER — ACETAMINOPHEN 325 MG PO TABS: 325.0000 mg | ORAL_TABLET | Freq: Four times a day (QID) | ORAL | Status: DC | PRN

## 2023-03-26 NOTE — Progress Notes (Signed)
Physical Therapy Treatment Patient Details Name: [name] MRN: [id number] DOB: [birth year] Today's Date: 03/26/2023   History of Present Illness Pt is an 82 y.o. female who presented 03/24/23 s/p fall, sustaining an acute displaced fracture of the right femoral diaphysis. S/p IM nailing of R femoral shaft fx 7/22. PMH: GERD, osteoporosis, HLD    PT Comments  Pt demonstrates improved AROM of her R lower extremity today, but still needs some assistance to manage it with bed mobility. She also demonstrates improved R lower extremity pain tolerance, placing weight on it when standing today. However, she was limited in progressing OOB mobility by her beginning to feel lightheaded when standing and her BP was noted to be low, see below. Notified RN and returned pt to supine with symptoms improving. Instructed pt on performing quad sets and heel slides in bed to promote improved strength, ROM, and pain tolerance. Will continue to follow acutely.  BP:  95/47 (60) sitting after standing the first rep with reports of lightheadedness 84/72 (78) after the second rep and more lightheaded 99/50 (66) upon initially lying down with symptoms improving    Assistance Recommended at Discharge Intermittent Supervision/Assistance  If plan is discharge home, recommend the following:  Can travel by private vehicle    A lot of help with walking and/or transfers;A lot of help with bathing/dressing/bathroom;Assistance with cooking/housework;Assist for transportation;Help with stairs or ramp for entrance   No  Equipment Recommendations  Rolling walker (2 wheels);BSC/3in1    Recommendations for Other Services       Precautions / Restrictions Precautions Precautions: Fall Precaution Comments: watch BP Restrictions Weight Bearing Restrictions: Yes RLE Weight Bearing: Weight bearing as tolerated     Mobility  Bed Mobility Overal bed mobility: Needs Assistance Bed Mobility: Supine to Sit, Sit to  Supine     Supine to sit: Min assist, HOB elevated Sit to supine: Min assist, HOB elevated   General bed mobility comments: Extra time and cues to bring each leg towards and off L EOB, assistance at R leg and at hips to pivot and scoot to EOB. MinA to lift legs onto bed with return to supine.    Transfers Overall transfer level: Needs assistance Equipment used: Rolling walker (2 wheels) Transfers: Sit to/from Stand Sit to Stand: Mod assist, Min assist           General transfer comment: MinA first rep and modA second rep to shift weight anteriorly and power up to stand from elevated EOB with cues for hand placement. Pt standing upright and placing weight on R leg today.    Ambulation/Gait               General Gait Details: unable due to pt standing briefly before sitting due to feeling lightheaded and noted low BP   Stairs             Wheelchair Mobility     Tilt Bed    Modified Rankin (Stroke Patients Only)       Balance Overall balance assessment: Needs assistance Sitting-balance support: Feet supported, Bilateral upper extremity supported Sitting balance-Leahy Scale: Poor Sitting balance - Comments: Min guard to sit statically EOB   Standing balance support: Bilateral upper extremity supported, Reliant on assistive device for balance, During functional activity Standing balance-Leahy Scale: Poor Standing balance comment: Reliant on RW and min-modA for static standing  Cognition Arousal/Alertness: Awake/alert Behavior During Therapy: WFL for tasks assessed/performed Overall Cognitive Status: Within Functional Limits for tasks assessed                                          Exercises General Exercises - Lower Extremity Quad Sets: AROM, Strengthening, Right, 10 reps, Supine Heel Slides: AROM, Right, 10 reps, Supine    General Comments General comments (skin integrity, edema, etc.): BP  95/47 (60) sitting after standing the first rep with reports of lightheadedness. 84/72 (78) after the second rep and more lightheaded. Symptoms improved once supine with BP rising to 99/50 (66) upon initially lying down      Pertinent Vitals/Pain Pain Assessment Pain Assessment: Faces Faces Pain Scale: Hurts even more Pain Location: R hip Pain Descriptors / Indicators: Discomfort, Grimacing, Operative site guarding Pain Intervention(s): Limited activity within patient's tolerance, Monitored during session, Repositioned    Home Living                          Prior Function            PT Goals (current goals can now be found in the care plan section) Acute Rehab PT Goals Patient Stated Goal: to improve PT Goal Formulation: With patient Time For Goal Achievement: 04/08/23 Potential to Achieve Goals: Good Progress towards PT goals: Progressing toward goals    Frequency    Min 1X/week      PT Plan Current plan remains appropriate    Co-evaluation              AM-PAC PT "6 Clicks" Mobility   Outcome Measure  Help needed turning from your back to your side while in a flat bed without using bedrails?: A Little Help needed moving from lying on your back to sitting on the side of a flat bed without using bedrails?: A Little Help needed moving to and from a bed to a chair (including a wheelchair)?: Total Help needed standing up from a chair using your arms (e.g., wheelchair or bedside chair)?: A Lot Help needed to walk in hospital room?: Total Help needed climbing 3-5 steps with a railing? : Total 6 Click Score: 11    End of Session Equipment Utilized During Treatment: Gait belt Activity Tolerance: Patient limited by pain;Other (comment) (limited by nausea/lightheadedness and low BP) Patient left: in bed;with call bell/phone within reach;with bed alarm set Nurse Communication: Other (comment) (BP and symptoms) PT Visit Diagnosis: Unsteadiness on feet  (R26.81);Other abnormalities of gait and mobility (R26.89);Muscle weakness (generalized) (M62.81);History of falling (Z91.81);Difficulty in walking, not elsewhere classified (R26.2);Pain Pain - Right/Left: Right Pain - part of body: Hip     Time: 7829-5621 PT Time Calculation (min) (ACUTE ONLY): 21 min  Charges:    $Therapeutic Activity: 8-22 mins PT General Charges $$ ACUTE PT VISIT: 1 Visit                     Raymond Gurney, PT, DPT Acute Rehabilitation Services  Office: [phone]    Jewel Baize 03/26/2023, 5:36 PM

## 2023-03-26 NOTE — Assessment & Plan Note (Addendum)
-   Supplement vit D, Ca

## 2023-03-26 NOTE — Assessment & Plan Note (Addendum)
Hgb stable in 7s.  Given cardiac condition, will transfuse. - Transfuse 1u PRBCs now

## 2023-03-26 NOTE — Assessment & Plan Note (Addendum)
Stubbornly hyponatremic (prior baseline ~137-139 last Dec, Jan, just prior to admission 130, 128 on admission) - Defer to Nephrology expertise

## 2023-03-26 NOTE — Assessment & Plan Note (Signed)
Continue statin. 

## 2023-03-26 NOTE — TOC Transition Note (Signed)
Transition of Care [facility]) - CM/SW Discharge Note   Patient Details  Name: [name] MRN: [id number] Date of Birth: [birth year]  [facility] CM/SW Contact:  Erin Sons, LCSW Phone Number: 03/26/2023, 3:40 PM   Clinical Narrative:     CSW met with pt and discussed SNF rec. Pt lives at home with her spouse in [place]. She is agreeable to SNF w/u. She has not been to a SNF before but expresses a preference for [facility]; she states she plans on calling a friend who works there to potentially help with getting her in. CSW will complete fl2 and fax bed requests in hub.   1535: CSW called pt's son after being notified by RN that son requesting call. Son is wanting to discuss SNF placement. Son has already spoken with [facility] and was informed that they do not accept pt's [employer]. CSW provided single bed offer for [facility] in [place]. Son plans to discuss offer with pt and then will contact CSW back regarding decision.    Final next level of care: Skilled Nursing Facility Barriers to Discharge: English as a second language teacher, Continued Medical Work up           Discharge Plan and Services Additional resources added to the After Visit Summary for                                       Social Determinants of Health (SDOH) Interventions SDOH Screenings   Food Insecurity: No Food Insecurity (03/24/2023)  Housing: Low Risk  (03/24/2023)  Transportation Needs: No Transportation Needs (03/24/2023)  Utilities: Not At Risk (03/24/2023)  Financial Resource Strain: Low Risk  (02/17/2023)   Received from [facility]  Tobacco Use: Low Risk  (03/24/2023)     Readmission Risk Interventions     No data to display

## 2023-03-26 NOTE — Discharge Instructions (Addendum)
Orthopaedic Trauma Service Discharge Instructions   General Discharge Instructions  WEIGHT BEARING STATUS:weightbearing as tolerated  RANGE OF MOTION/ACTIVITY:Unrestricted motion of the hip and knee  Wound Care: Incisions can be left open to air if there is no drainage. Once the incision is completely dry and without drainage, it may be left open to air out.  Showering may begin post op day 3, (Thursday 03/27/23).  Clean incision gently with soap and water.  DVT/PE prophylaxis: Aspirin 325 mg daily x 30 days  Diet: as you were eating previously.  Can use over the counter stool softeners and bowel preparations, such as Miralax, to help with bowel movements.  Narcotics can be constipating.  Be sure to drink plenty of fluids  PAIN MEDICATION USE AND EXPECTATIONS  You have likely been given narcotic medications to help control your pain.  After a traumatic event that results in an fracture (broken bone) with or without surgery, it is ok to use narcotic pain medications to help control one's pain.  We understand that everyone responds to pain differently and each individual patient will be evaluated on a regular basis for the continued need for narcotic medications. Ideally, narcotic medication use should last no more than 6-8 weeks (coinciding with fracture healing).   As a patient it is your responsibility as well to monitor narcotic medication use and report the amount and frequency you use these medications when you come to your office visit.   We would also advise that if you are using narcotic medications, you should take a dose prior to therapy to maximize you participation.  IF YOU ARE ON NARCOTIC MEDICATIONS IT IS NOT PERMISSIBLE TO OPERATE A MOTOR VEHICLE (MOTORCYCLE/CAR/TRUCK/MOPED) OR HEAVY MACHINERY DO NOT MIX NARCOTICS WITH OTHER CNS (CENTRAL NERVOUS SYSTEM) DEPRESSANTS SUCH AS ALCOHOL   STOP SMOKING OR USING NICOTINE PRODUCTS!!!!  As discussed nicotine severely impairs your  body's ability to heal surgical and traumatic wounds but also impairs bone healing.  Wounds and bone heal by forming microscopic blood vessels (angiogenesis) and nicotine is a vasoconstrictor (essentially, shrinks blood vessels).  Therefore, if vasoconstriction occurs to these microscopic blood vessels they essentially disappear and are unable to deliver necessary nutrients to the healing tissue.  This is one modifiable factor that you can do to dramatically increase your chances of healing your injury.    (This means no smoking, no nicotine gum, patches, etc)  DO NOT USE NONSTEROIDAL ANTI-INFLAMMATORY DRUGS (NSAID'S)  Using products such as Advil (ibuprofen), Aleve (naproxen), Motrin (ibuprofen) for additional pain control during fracture healing can delay and/or prevent the healing response.  If you would like to take over the counter (OTC) medication, Tylenol (acetaminophen) is ok.  However, some narcotic medications that are given for pain control contain acetaminophen as well. Therefore, you should not exceed more than 4000 mg of tylenol in a day if you do not have liver disease.  Also note that there are may OTC medicines, such as cold medicines and allergy medicines that my contain tylenol as well.  If you have any questions about medications and/or interactions please ask your doctor/PA or your pharmacist.      ICE AND ELEVATE INJURED/OPERATIVE EXTREMITY  Using ice and elevating the injured extremity above your heart can help with swelling and pain control.  Icing in a pulsatile fashion, such as 20 minutes on and 20 minutes off, can be followed.    Do not place ice directly on skin. Make sure there is a barrier between to  skin and the ice pack.    Using frozen items such as frozen peas works well as the conform nicely to the are that needs to be iced.  USE AN ACE WRAP OR TED HOSE FOR SWELLING CONTROL  In addition to icing and elevation, Ace wraps or TED hose are used to help limit and resolve  swelling.  It is recommended to use Ace wraps or TED hose until you are informed to stop.    When using Ace Wraps start the wrapping distally (farthest away from the body) and wrap proximally (closer to the body)   Example: If you had surgery on your leg or thing and you do not have a splint on, start the ace wrap at the toes and work your way up to the thigh        If you had surgery on your upper extremity and do not have a splint on, start the ace wrap at your fingers and work your way up to the upper arm   CALL THE OFFICE WITH ANY QUESTIONS OR CONCERNS: [phone]   VISIT OUR WEBSITE FOR ADDITIONAL INFORMATION: [email or web address]    Discharge Wound Care Instructions  Do NOT apply any ointments, solutions or lotions to pin sites or surgical wounds.  These prevent needed drainage and even though solutions like hydrogen peroxide kill bacteria, they also damage cells lining the pin sites that help fight infection.  Applying lotions or ointments can keep the wounds moist and can cause them to breakdown and open up as well. This can increase the risk for infection. When in doubt call the office.  If any drainage is noted, use one layer of adaptic or Mepitel, then gauze, Kerlix, and an ace wrap. - These dressing supplies should be available at local medical supply stores [facility], [facility], etc) as well as Insurance claims handler ([facility], [facility], [place], etc)  Once the incision is completely dry and without drainage, it may be left open to air out.  Showering may begin 36-48 hours later.  Cleaning gently with soap and water.  Information on my medicine - ELIQUIS (apixaban)  Why was Eliquis prescribed for you? Eliquis was prescribed for you to reduce the risk of a blood clot forming that can cause a stroke if you have a medical condition called atrial fibrillation (a type of irregular heartbeat).  What do You need to know about Eliquis ? Take your Eliquis TWICE DAILY - one tablet  in the morning and one tablet in the evening with or without food. If you have difficulty swallowing the tablet whole please discuss with your pharmacist how to take the medication safely.  Take Eliquis exactly as prescribed by your doctor and DO NOT stop taking Eliquis without talking to the doctor who prescribed the medication.  Stopping may increase your risk of developing a stroke.  Refill your prescription before you run out.  After discharge, you should have regular check-up appointments with your healthcare provider that is prescribing your Eliquis.  In the future your dose may need to be changed if your kidney function or weight changes by a significant amount or as you get older.  What do you do if you miss a dose? If you miss a dose, take it as soon as you remember on the same day and resume taking twice daily.  Do not take more than one dose of ELIQUIS at the same time to make up a missed dose.  Important Safety Information A possible side effect of  Eliquis is bleeding. You should call your healthcare provider right away if you experience any of the following: Bleeding from an injury or your nose that does not stop. Unusual colored urine (red or dark brown) or unusual colored stools (red or black). Unusual bruising for unknown reasons. A serious fall or if you hit your head (even if there is no bleeding).  Some medicines may interact with Eliquis and might increase your risk of bleeding or clotting while on Eliquis. To help avoid this, consult your healthcare provider or pharmacist prior to using any new prescription or non-prescription medications, including herbals, vitamins, non-steroidal anti-inflammatory drugs (NSAIDs) and supplements.  This website has more information on Eliquis (apixaban): http://[email or web address].com/eliquis/home

## 2023-03-26 NOTE — Progress Notes (Signed)
Orthopaedic Trauma Progress Note  SUBJECTIVE: Doing ok this morning, pain fairly well controlled on current regimen.  Was able to work with therapies yesterday but sessions were cut short due to nausea.  Denies any vomiting.  Zofran helping some.  His denies any significant numbness or tingling throughout the right lower extremity.  No chest pain. No SOB. No other complaints.  Patient agreeable to SNF.  Patient remains hyponatremic.  Will limit fluid intake today.  OBJECTIVE:  Vitals:   03/26/23 0358 03/26/23 0747  BP: (!) 123/55 (!) 114/52  Pulse: 100 86  Resp: 17 17  Temp: 98 F (36.7 C) 97.8 F (36.6 C)  SpO2: 94% 92%    General: Sitting up in bed, no acute distress.  Pleasant and cooperative.  Alert and oriented x 3 Respiratory: No increased work of breathing.  RLE: Dressings removed, incisions are clean, dry, intact.  Minimally tender over the lateral hip and thigh.  No significant tenderness about the knee or throughout the lower leg.  No calf tenderness.  Ankle dorsiflexion/plantarflexion is intact.  Able to wiggle toes.  Endorses sensation distally.  Compartment soft compressible.  Skin warm and dry. + DP pulse  IMAGING: Stable post op imaging.   LABS:  Results for orders placed or performed during the hospital encounter of 03/24/23 (from the past 24 hour(s))  Basic metabolic panel     Status: Abnormal   Collection Time: 03/25/23  2:15 PM  Result Value Ref Range   Sodium 126 (L) 135 - 145 mmol/L   Potassium 3.9 3.5 - 5.1 mmol/L   Chloride 91 (L) 98 - 111 mmol/L   CO2 24 22 - 32 mmol/L   Glucose, Bld 153 (H) 70 - 99 mg/dL   BUN 9 8 - 23 mg/dL   Creatinine, Ser 7.82 0.44 - 1.00 mg/dL   Calcium 7.8 (L) 8.9 - 10.3 mg/dL   GFR, Estimated >95 >62 mL/min   Anion gap 11 5 - 15  CBC     Status: Abnormal   Collection Time: 03/26/23  1:14 AM  Result Value Ref Range   WBC 8.0 4.0 - 10.5 K/uL   RBC 2.50 (L) 3.87 - 5.11 MIL/uL   Hemoglobin 8.1 (L) 12.0 - 15.0 g/dL   HCT 13.0  (L) 86.5 - 46.0 %   MCV 92.4 80.0 - 100.0 fL   MCH 32.4 26.0 - 34.0 pg   MCHC 35.1 30.0 - 36.0 g/dL   RDW 78.4 69.6 - 29.5 %   Platelets 246 150 - 400 K/uL   nRBC 0.0 0.0 - 0.2 %  Basic metabolic panel     Status: Abnormal   Collection Time: 03/26/23  1:14 AM  Result Value Ref Range   Sodium 126 (L) 135 - 145 mmol/L   Potassium 3.7 3.5 - 5.1 mmol/L   Chloride 91 (L) 98 - 111 mmol/L   CO2 24 22 - 32 mmol/L   Glucose, Bld 111 (H) 70 - 99 mg/dL   BUN 7 (L) 8 - 23 mg/dL   Creatinine, Ser 2.84 0.44 - 1.00 mg/dL   Calcium 7.8 (L) 8.9 - 10.3 mg/dL   GFR, Estimated >13 >24 mL/min   Anion gap 11 5 - 15    ASSESSMENT: [name] is a 82 y.o. female, 2 Days Post-Op s/p INTRAMEDULLARY NAIL RIGHT FEMORAL SHAFT FRACTURE   CV/Blood loss: Acute blood loss anemia.  Hemoglobin 8.1 this morning.  Hemodynamically stable.  Continue to monitor CBC.  PLAN: Weightbearing: WBAT RLE  ROM:  Ok for hip and knee motion as tolerated  Incisional and dressing care: Okay to leave incisions open to air  Showering: Ok to get incisions wet starting 03/27/23 Orthopedic device(s): None  Pain management:  1. Tylenol 325-650 mg q 6 hours PRN 2. Robaxin 500 mg q 6 hours PRN 3. Norco 5-325 mg OR 7.5-325 mg q 4 hours PRN 4. Morphine 0.5-1 mg q 2 hours PRN VTE prophylaxis: Lovenox, SCDs ID:  Ancef 2gm post op completed Foley/Lines:  No foley, KVO IVFs Impediments to Fracture Healing: Vit D level 27, started on supplementation. Dispo: PT/OT evaluation ongoing, currently recommending SNF.  Patient agreeable to this.  TOC following for placement.  I have signed and placed discharge Rx for pain medication, muscle relaxer, vitamin D, DVT prophylaxis in patient's chart  D/C recommendations: - Hydrocodone and Robaxin for pain control - Aspirin 325 mg daily x 30 days for DVT prophylaxis - Continue 2000 units Vit D supplementation daily  Follow - up plan: 2 weeks after discharge for wound check and repeat  x-rays   Contact information:  Truitt Merle MD, Thyra Breed PA-C. After hours and holidays please check [email or web address] for group call information for [facility]   Thompson Caul, PA-C [phone] (office) [email or web address]

## 2023-03-26 NOTE — Plan of Care (Signed)
  Problem: Clinical Measurements: Goal: Ability to maintain clinical measurements within normal limits will improve Outcome: Progressing Goal: Will remain free from infection Outcome: Progressing Goal: Diagnostic test results will improve Outcome: Progressing Goal: Respiratory complications will improve Outcome: Progressing Goal: Cardiovascular complication will be avoided Outcome: Progressing   Problem: Health Behavior/Discharge Planning: Goal: Ability to manage health-related needs will improve Outcome: Progressing   Problem: Education: Goal: Knowledge of General Education information will improve Description: Including pain rating scale, medication(s)/side effects and non-pharmacologic comfort measures Outcome: Progressing   Problem: Activity: Goal: Risk for activity intolerance will decrease Outcome: Progressing

## 2023-03-26 NOTE — Assessment & Plan Note (Addendum)
S/p reduction and internal fixation 03/24/23 by Dr. [name] - WBAT and ROM as tolerated - Continue vit D and calcium at d/c - Should NOT have aspirin for DVT ppx at d/c (will be on new Eliquis)

## 2023-03-26 NOTE — Assessment & Plan Note (Addendum)
BP soft overnight - Continue metoprolol

## 2023-03-26 NOTE — NC FL2 (Signed)
[place] MEDICAID FL2 LEVEL OF CARE FORM     IDENTIFICATION  Patient Name: [name] Birthdate: Jul 07, 1941 Sex: female Admission Date (Current Location): 03/24/2023  [facility] and [place] Number:  Producer, television/film/video and Address:  [facility], [address], [place], [place] [address]      Provider Number: [id number]  Attending Physician Name and Address:  Alberteen Sam, *  Relative Name and Phone Number:       Current Level of Care: Hospital Recommended Level of Care: Skilled Nursing Facility Prior Approval Number:    Date Approved/Denied:   PASRR Number: [id number]  Discharge Plan: SNF    Current Diagnoses: Patient Active Problem List   Diagnosis Date Noted   Vitamin D deficiency 03/26/2023   Iron deficiency anemia 03/25/2023   Right femoral shaft fracture (HCC) 03/24/2023   Hyponatremia 03/24/2023   Essential hypertension 03/24/2023   Chronic mitral valve regurgitation 03/24/2023   Bilateral lower extremity edema 03/24/2023   GERD (gastroesophageal reflux disease) 02/11/2014   Dyslipidemia 03/06/2010   CAD, NATIVE VESSEL 03/06/2010    Orientation RESPIRATION BLADDER Height & Weight     Time, Self, Situation, Place  Normal Continent Weight: 101 lb 3.1 oz (45.9 kg) Height:  5\' 4"  (162.6 cm)  BEHAVIORAL SYMPTOMS/MOOD NEUROLOGICAL BOWEL NUTRITION STATUS      Continent Diet (See d/c summary)  AMBULATORY STATUS COMMUNICATION OF NEEDS Skin   Extensive Assist Verbally Surgical wounds (incision right hip)                       Personal Care Assistance Level of Assistance  Bathing, Feeding, Dressing Bathing Assistance: Limited assistance Feeding assistance: Independent Dressing Assistance: Limited assistance     Functional Limitations Info  Sight, Hearing, Speech Sight Info: Adequate Hearing Info: Adequate Speech Info: Adequate    SPECIAL CARE FACTORS FREQUENCY  PT (By licensed PT), OT (By licensed OT)      PT Frequency: 5x/week OT Frequency: 5x/week            Contractures Contractures Info: Not present    Additional Factors Info  Code Status, Allergies Code Status Info: full code Allergies Info: Bisphosphonates, Boniva (ibandronic Acid), Fosamax (alendronate Sodium), Lipitor (atorvastatin), Etodolac           Current Medications (03/26/2023):  This is the current hospital active medication list Current Facility-Administered Medications  Medication Dose Route Frequency Provider Last Rate Last Admin   0.9 %  sodium chloride infusion  250 mL Intravenous PRN Janalyn Shy, Subrina, MD       acetaminophen (TYLENOL) tablet 325-650 mg  325-650 mg Oral Q6H PRN [place], PA-C       cholecalciferol (VITAMIN D3) 25 MCG (1000 UNIT) tablet 2,000 Units  2,000 Units Oral Daily [place], PA-C   2,000 Units at 03/26/23 0910   docusate sodium (COLACE) capsule 100 mg  100 mg Oral BID Sundil, Subrina, MD   100 mg at 03/26/23 0913   enoxaparin (LOVENOX) injection 40 mg  40 mg Subcutaneous Q24H Thyra Breed A, PA-C   40 mg at 03/26/23 0914   hydrALAZINE (APRESOLINE) injection 5 mg  5 mg Intravenous Q8H PRN Janalyn Shy, Subrina, MD       HYDROcodone-acetaminophen (NORCO) 7.5-325 MG per tablet 1-2 tablet  1-2 tablet Oral Q4H PRN [place], PA-C   2 tablet at 03/26/23 0408   HYDROcodone-acetaminophen (NORCO/VICODIN) 5-325 MG per tablet 1-2 tablet  1-2 tablet Oral Q4H  PRN [place], PA-C       methocarbamol (ROBAXIN) tablet 500 mg  500 mg Oral Q6H PRN [place], PA-C   500 mg at 03/25/23 4132   Or   methocarbamol (ROBAXIN) 500 mg in dextrose 5 % 50 mL IVPB  500 mg Intravenous Q6H PRN [place], PA-C       metoCLOPramide (REGLAN) tablet 5-10 mg  5-10 mg Oral Q8H PRN Sharon Seller, Sarah A, PA-C       Or   metoCLOPramide (REGLAN) injection 5-10 mg  5-10 mg Intravenous Q8H PRN [place], PA-C   10 mg at 03/24/23 2326   metoprolol succinate (TOPROL-XL) 24 hr tablet 12.5 mg   12.5 mg Oral Daily Sundil, Subrina, MD   12.5 mg at 03/26/23 0910   morphine (PF) 2 MG/ML injection 0.5-1 mg  0.5-1 mg Intravenous Q2H PRN [place], PA-C       ondansetron (ZOFRAN) tablet 4 mg  4 mg Oral Q6H PRN [place], PA-C       Or   ondansetron (ZOFRAN) injection 4 mg  4 mg Intravenous Q6H PRN [place], PA-C   4 mg at 03/26/23 1039   pantoprazole (PROTONIX) EC tablet 40 mg  40 mg Oral Daily Sundil, Subrina, MD   40 mg at 03/26/23 0910   polyethylene glycol (MIRALAX / GLYCOLAX) packet 17 g  17 g Oral Daily PRN Janalyn Shy, Subrina, MD       pravastatin (PRAVACHOL) tablet 40 mg  40 mg Oral q1800 Jonah Blue, MD   40 mg at 03/25/23 4401   sodium chloride flush (NS) 0.9 % injection 3 mL  3 mL Intravenous Q12H Sundil, Subrina, MD   3 mL at 03/26/23 0917   sodium chloride flush (NS) 0.9 % injection 3 mL  3 mL Intravenous PRN Tereasa Coop, MD         Discharge Medications: Please see discharge summary for a list of discharge medications.  Relevant Imaging Results:  Relevant Lab Results:   Additional Information SSN [id number] [place], [place]

## 2023-03-26 NOTE — Progress Notes (Signed)
  Progress Note   Patient: [name] EAV:[id number] DOB: 1941-03-03 DOA: 03/24/2023     2 DOS: the patient was seen and examined on 03/26/2023 at 8:49 AM      Brief hospital course: [name] is an 82 y.o. F with HTN, HLD who presented after a mechanical fall and was found to have R midshaft femoral fracture.    S/p IM nail on 7/22.  Hospital stay complicated by hyponatremia.        Assessment and Plan: * Right femoral shaft fracture (HCC) S/p reduction and internal fixation 03/24/23 by Dr. [name] - WBAT and ROM as tolerated - Aspirin 325 daily at d/c    Hyponatremia Na down to 126 today.  Feels weak. No seizures or confusion.  Didn't improve with IV fluids.  Appears euvolemic. - Obtain urine electrolytes - Avoid IV fluids - Fluid restriction    Vitamin D deficiency - Supplement vit D  Iron deficiency anemia Hgb 8.1 and stable, no clinical bleeding  Essential hypertension BP controlled - Continue metoprolol  Dyslipidemia - Continue statin          Subjective: Patient is feeling weak.  She has had episodes of nausea.  No vomiting, seizures, confusion.  No focal weakness.  No nursing concerns.     Physical Exam: BP (!) 114/52 (BP Location: Left Arm)   Pulse 86   Temp 97.8 F (36.6 C) (Oral)   Resp 17   Ht 5\' 4"  (1.626 m)   Wt 45.9 kg   SpO2 92%   BMI 17.37 kg/m   Thin elderly female, pale, watching the news on TV. RRR, no murmurs, no peripheral edema Respiratory rate normal, lungs clear without rales or wheezes Abdomen soft without tenderness palpation or guarding Attention normal, affect appropriate, judgment and insight appear normal    Data Reviewed: Basic metabolic panel shows sodium down to 126 CBC shows hemoglobin stable at 8.1 Vitamin D level low  Family Communication: None present    Disposition: Status is: Inpatient Patient was admitted for femur fracture.  She is undergone repair, but her sodium is trending  down.  We will start fluid restriction, improve her oral intake, and hopefully within the next 24 hours she will be ready for discharge to SNF.        Author: Alberteen Sam, MD 03/26/2023 11:12 AM  For on call review [email or web address].

## 2023-03-27 DIAGNOSIS — S72301A Unspecified fracture of shaft of right femur, initial encounter for closed fracture: Secondary | ICD-10-CM | POA: Diagnosis not present

## 2023-03-27 DIAGNOSIS — E871 Hypo-osmolality and hyponatremia: Secondary | ICD-10-CM | POA: Diagnosis not present

## 2023-03-27 DIAGNOSIS — I10 Essential (primary) hypertension: Secondary | ICD-10-CM | POA: Diagnosis not present

## 2023-03-27 DIAGNOSIS — E785 Hyperlipidemia, unspecified: Secondary | ICD-10-CM | POA: Diagnosis not present

## 2023-03-27 LAB — BASIC METABOLIC PANEL
Anion gap: 8 (ref 5–15)
BUN: 9 mg/dL (ref 8–23)
CO2: 27 mmol/L (ref 22–32)
Calcium: 7.7 mg/dL — ABNORMAL LOW (ref 8.9–10.3)
Creatinine, Ser: 0.49 mg/dL (ref 0.44–1.00)
GFR, Estimated: >60 mL/min (ref >60)
Glucose, Bld: 112 mg/dL — ABNORMAL HIGH (ref 70–99)
Potassium: 3.5 mmol/L (ref 3.5–5.1)
Sodium: 121 mmol/L — ABNORMAL LOW (ref 135–145)

## 2023-03-27 LAB — CBC
HCT: 22.2 % — ABNORMAL LOW (ref 36.0–46.0)
Hemoglobin: 7.9 g/dL — ABNORMAL LOW (ref 12.0–15.0)
MCH: 32.4 pg (ref 26.0–34.0)
MCHC: 35.6 g/dL (ref 30.0–36.0)
MCV: 91 fL (ref 80.0–100.0)
Platelets: 268 10*3/uL (ref 150–400)
RBC: 2.44 MIL/uL — ABNORMAL LOW (ref 3.87–5.11)
RDW: 12.1 % (ref 11.5–15.5)
WBC: 8.2 10*3/uL (ref 4.0–10.5)
nRBC: 0 % (ref 0.0–0.2)

## 2023-03-27 MED ORDER — MENTHOL 3 MG MT LOZG
1.0000 | LOZENGE | OROMUCOSAL | Status: DC | PRN
  Administered 2023-03-27: 3 mg via ORAL
  Filled 2023-03-27: qty 9

## 2023-03-27 MED ORDER — SODIUM CHLORIDE 1 G PO TABS
1.0000 g | ORAL_TABLET | Freq: Three times a day (TID) | ORAL | Status: DC
  Administered 2023-03-27 – 2023-04-04 (×25): 1 g via ORAL
  Filled 2023-03-27 (×25): qty 1

## 2023-03-27 NOTE — Plan of Care (Signed)

## 2023-03-27 NOTE — Progress Notes (Signed)
Occupational Therapy Treatment Patient Details Name: [name] MRN: [id number] DOB: 17-Jul-1941 Today's Date: 03/27/2023   History of present illness Pt is an 82 y.o. female who presented 03/24/23 s/p fall, sustaining an acute displaced fracture of the right femoral diaphysis. S/p IM nailing of R femoral shaft fx 7/22. PMH: GERD, osteoporosis, HLD   OT comments  Patient demonstrating good gains with OT treatment. Patient fearful of feeling nauseous when getting up but had no complaints during session. BP in supine 129/61 and min assist to get to EOB with BP 122/60. Patient instructed on transfer to recliner with mod assist and BP 116/59 seated in recliner. Patient performed grooming tasks seated in recliner and BP was 130/73 at end of session. Patient will benefit from continued inpatient follow up therapy, <3 hours/day to address bathing, dressing, and toilet transfers. Acute OT to continue to follow.    Recommendations for follow up therapy are one component of a multi-disciplinary discharge planning process, led by the attending physician.  Recommendations may be updated based on patient status, additional functional criteria and insurance authorization.    Assistance Recommended at Discharge Frequent or constant Supervision/Assistance  Patient can return home with the following  Help with stairs or ramp for entrance;Assist for transportation;Assistance with cooking/housework;A lot of help with walking and/or transfers;A lot of help with bathing/dressing/bathroom   Equipment Recommendations  None recommended by OT    Recommendations for Other Services      Precautions / Restrictions Precautions Precautions: Fall Precaution Comments: watch BP Restrictions Weight Bearing Restrictions: Yes RLE Weight Bearing: Weight bearing as tolerated       Mobility Bed Mobility Overal bed mobility: Needs Assistance Bed Mobility: Supine to Sit     Supine to sit: Min assist, HOB elevated      General bed mobility comments: increased time and assistance with RLE, use bed rail to raise trunk    Transfers Overall transfer level: Needs assistance Equipment used: Rolling walker (2 wheels) Transfers: Sit to/from Stand, Bed to chair/wheelchair/BSC Sit to Stand: Mod assist     Step pivot transfers: Mod assist     General transfer comment: instructions on weight shifting and UE use to advance RLE     Balance Overall balance assessment: Needs assistance Sitting-balance support: Feet supported, Bilateral upper extremity supported Sitting balance-Leahy Scale: Poor Sitting balance - Comments: min guard   Standing balance support: Bilateral upper extremity supported, Reliant on assistive device for balance, During functional activity Standing balance-Leahy Scale: Poor Standing balance comment: reliant on RW for support                           ADL either performed or assessed with clinical judgement   ADL Overall ADL's : Needs assistance/impaired Eating/Feeding: Set up;Sitting Eating/Feeding Details (indicate cue type and reason): up in recliner Grooming: Wash/dry hands;Wash/dry face;Oral care;Brushing hair;Set up;Sitting Grooming Details (indicate cue type and reason): in recliner                 Toilet Transfer: Moderate assistance;Rolling walker (2 wheels) Toilet Transfer Details (indicate cue type and reason): simulated to recliner                Extremity/Trunk Assessment              Vision       Perception     Praxis      Cognition Arousal/Alertness: Awake/alert Behavior During Therapy: WFL for tasks assessed/performed Overall Cognitive  Status: Within Functional Limits for tasks assessed                                 General Comments: fearful of becoming nauseous        Exercises      Shoulder Instructions       General Comments BP in supine 129/61, seated on EOB 122/60, seated in recliner 116/59,  130/73 at end of session    Pertinent Vitals/ Pain       Pain Assessment Pain Assessment: Faces Faces Pain Scale: Hurts little more Pain Location: R hip Pain Descriptors / Indicators: Discomfort, Grimacing, Operative site guarding Pain Intervention(s): Limited activity within patient's tolerance, Monitored during session, Repositioned  Home Living                                          Prior Functioning/Environment              Frequency  Min 1X/week        Progress Toward Goals  OT Goals(current goals can now be found in the care plan section)  Progress towards OT goals: Progressing toward goals  Acute Rehab OT Goals Patient Stated Goal: walk more OT Goal Formulation: With patient Time For Goal Achievement: 04/08/23 Potential to Achieve Goals: Good ADL Goals Pt Will Perform Grooming: with supervision;standing Pt Will Perform Lower Body Dressing: with supervision;sit to/from stand;with adaptive equipment Pt Will Transfer to Toilet: with supervision;ambulating;regular height toilet  Plan Discharge plan remains appropriate    Co-evaluation                 AM-PAC OT "6 Clicks" Daily Activity     Outcome Measure   Help from another person eating meals?: None Help from another person taking care of personal grooming?: A Little Help from another person toileting, which includes using toliet, bedpan, or urinal?: A Lot Help from another person bathing (including washing, rinsing, drying)?: A Lot Help from another person to put on and taking off regular upper body clothing?: A Little Help from another person to put on and taking off regular lower body clothing?: A Lot 6 Click Score: 16    End of Session Equipment Utilized During Treatment: Gait belt;Rolling walker (2 wheels)  OT Visit Diagnosis: Unsteadiness on feet (R26.81);Pain Pain - Right/Left: Right Pain - part of body: Hip;Leg   Activity Tolerance Patient tolerated treatment  well   Patient Left in chair;with call bell/phone within reach;with chair alarm set   Nurse Communication Mobility status        Time: 1478-2956 OT Time Calculation (min): 26 min  Charges: OT General Charges $OT Visit: 1 Visit OT Treatments $Self Care/Home Management : 23-37 mins  Alfonse Flavors, OTA Acute Rehabilitation Services  Office [phone]   Dewain Penning 03/27/2023, 8:59 AM

## 2023-03-27 NOTE — Progress Notes (Signed)
  Progress Note   Patient: [name] MWU:[id number] DOB: 09/20/1940 DOA: 03/24/2023     3 DOS: the patient was seen and examined on 03/27/2023 at 8:14AM      Brief hospital course: Mrs. Manfredonia is an 82 y.o. F with HTN, HLD who presented after a mechanical fall and was found to have R midshaft femoral fracture.    S/p IM nail on 7/22.  Hospital stay complicated by hyponatremia.        Assessment and Plan: * Right femoral shaft fracture (HCC) S/p reduction and internal fixation 03/24/23 by Dr. [name] - WBAT and ROM as tolerated - Aspirin 325 daily at d/c    Hyponatremia due to SIADH Appears euvolemic.  Una and Oosm both inappropriately high.  Serum sodium down to 121 today.   Feels weak. No seizures or confusion.  - Start salt tabs - Continue Fluid restriction - Avoid IV fluids   Vitamin D deficiency - Supplement vit D  Iron deficiency anemia Hgb stable, no clinical bleeding  Essential hypertension BP controlled - Continue metoprolol  Dyslipidemia - Continue statin          Subjective: Patient still has some nausea, she still generally weak, dizzy with standing.  No fever, no confusion, no seizures, no nursing concerns.     Physical Exam: BP (!) 116/55 (BP Location: Right Arm)   Pulse 87   Temp 98.5 F (36.9 C) (Oral)   Resp 17   Ht 5\' 4"  (1.626 m)   Wt 46.2 kg   SpO2 95%   BMI 17.48 kg/m   Elderly adult female, sitting up in chair, appears weak and tired RRR, no murmurs, no peripheral edema Respiratory rate normal, lungs clear without rales or wheezes Abdomen soft no tenderness palpation Attention normal, affect blunted, judgment insight appear normal, face symmetric, speech fluent, upper extremity strength weak but symmetric.  Data Reviewed: Patient metabolic panel shows sodium down to 121 Urine electrolytes summarized above CBC shows anemia, relatively stable  Family Communication:    Disposition: Status is: Inpatient The  patient was admitted for hip fracture, she is undergone hemiarthroplasty and has done well since.  However her sodium continues to drop, so we will need to adjust treatment, hopefully within the next 24 to 48 hours her sodium will turnaround and she can discharge tomorrow or Saturday to SNF        Author: Alberteen Sam, MD 03/27/2023 11:12 AM  For on call review [email or web address].

## 2023-03-27 NOTE — Care Management Important Message (Signed)
Important Message  Patient Details  Name: [name] MRN: [id number] Date of Birth: [birth year]   Medicare Important Message Given:  Yes     Sherilyn Banker 03/27/2023, 1:46 PM

## 2023-03-27 NOTE — Progress Notes (Signed)
Mobility Specialist: Progress Note   03/27/23 1028  Orthostatic Sitting  BP- Sitting 107/52 ((62))  Pulse- Sitting 84  Orthostatic Standing at 0 minutes  BP- Standing at 0 minutes 103/57 ((68))  Orthostatic Standing at 3 minutes  BP- Standing at 3 minutes 110/47 ((66))  Mobility  Activity Transferred from chair to bed  Level of Assistance Minimal assist, patient does 75% or more (+2 (safety))  Assistive Device Front wheel walker  RLE Weight Bearing WBAT  Activity Response Tolerated fair  Mobility Referral Yes  $Mobility charge 1 Mobility  Mobility Specialist Start Time (ACUTE ONLY) 1010  Mobility Specialist Stop Time (ACUTE ONLY) 1029  Mobility Specialist Time Calculation (min) (ACUTE ONLY) 19 min   Pt was agreeable to mobility session - received in chair. Req minA +1 for STS, minA +2 (safety) to take steps. Session was limited d/t dizziness and fatigue. Neg orthostatic BP. Left in bed with all needs met - bed alarm on. Call bell in reach.   Maurene Capes Mobility Specialist Please contact via SecureChat or Rehab office at [phone]

## 2023-03-27 NOTE — TOC Progression Note (Signed)
Transition of Care [facility]) - Progression Note    Patient Details  Name: [name] MRN: [id number] Date of Birth: 19-Jul-1941  Transition of Care [facility] CM/SW Contact  Erin Sons, [place] Phone Number: 03/27/2023, 9:47 AM  Clinical Narrative:     Spoke with pt; she chooses [facility].   CS[facility] confirmed with [facility] they can accept pt as early as tomorrow pending SNF auth. [facility] is initiating SNF auth today.  Expected Discharge Plan: Skilled Nursing Facility Barriers to Discharge: English as a second language teacher, Continued Medical Work up  Expected Discharge Plan and Services                                               Social Determinants of Health (SDOH) Interventions SDOH Screenings   Food Insecurity: No Food Insecurity (03/24/2023)  Housing: Low Risk  (03/24/2023)  Transportation Needs: No Transportation Needs (03/24/2023)  Utilities: Not At Risk (03/24/2023)  Financial Resource Strain: Low Risk  (02/17/2023)   Received from [facility]  Tobacco Use: Low Risk  (03/24/2023)    Readmission Risk Interventions     No data to display

## 2023-03-27 NOTE — Plan of Care (Signed)
  Problem: Clinical Measurements: Goal: Respiratory complications will improve Outcome: Progressing Goal: Cardiovascular complication will be avoided Outcome: Progressing   Problem: Activity: Goal: Risk for activity intolerance will decrease Outcome: Progressing   Problem: Nutrition: Goal: Adequate nutrition will be maintained Outcome: Progressing   Problem: Coping: Goal: Level of anxiety will decrease Outcome: Progressing   Problem: Pain Managment: Goal: General experience of comfort will improve Outcome: Progressing   Problem: Safety: Goal: Ability to remain free from injury will improve Outcome: Progressing   Problem: Skin Integrity: Goal: Risk for impaired skin integrity will decrease Outcome: Progressing   

## 2023-03-28 ENCOUNTER — Encounter (HOSPITAL_COMMUNITY): Payer: Self-pay | Admitting: Student

## 2023-03-28 DIAGNOSIS — I10 Essential (primary) hypertension: Secondary | ICD-10-CM | POA: Diagnosis not present

## 2023-03-28 DIAGNOSIS — E785 Hyperlipidemia, unspecified: Secondary | ICD-10-CM | POA: Diagnosis not present

## 2023-03-28 DIAGNOSIS — S72301A Unspecified fracture of shaft of right femur, initial encounter for closed fracture: Secondary | ICD-10-CM | POA: Diagnosis not present

## 2023-03-28 DIAGNOSIS — E871 Hypo-osmolality and hyponatremia: Secondary | ICD-10-CM | POA: Diagnosis not present

## 2023-03-28 LAB — CBC: RBC: 2.58 MIL/uL — ABNORMAL LOW (ref 3.87–5.11)

## 2023-03-28 LAB — BASIC METABOLIC PANEL: Anion gap: 10 (ref 5–15)

## 2023-03-28 MED ORDER — ALUM & MAG HYDROXIDE-SIMETH 200-200-20 MG/5ML PO SUSP
30.0000 mL | ORAL | Status: DC | PRN
  Administered 2023-03-28 (×2): 30 mL via ORAL
  Filled 2023-03-28 (×2): qty 30

## 2023-03-28 NOTE — Progress Notes (Signed)
Physical Therapy Treatment Patient Details Name: [name] MRN: [id number] DOB: 10/24/1940 Today's Date: 03/28/2023   History of Present Illness Pt is an 82 y.o. female who presented 03/24/23 s/p fall, sustaining an acute displaced fracture of the right femoral diaphysis. S/p IM nailing of R femoral shaft fx 7/22. PMH: GERD, osteoporosis, HLD    PT Comments  Pt remains limited due to soft BPs and dizziness.  Unable to obtain full set of orthostatics as she was sitting up on arrival with feet in dependent position and she was unable to stand x 3 min to obtain BP.  Pt performed pivot to commode and back to void .  Pt unable to progress gt at this time.    BP 107/56 sitting BP 95/44 standing BP sitting after return to chair d/t dizziness.  99/51     Assistance Recommended at Discharge Intermittent Supervision/Assistance  If plan is discharge home, recommend the following:  Can travel by private vehicle    A lot of help with walking and/or transfers;A lot of help with bathing/dressing/bathroom;Assistance with cooking/housework;Assist for transportation;Help with stairs or ramp for entrance      Equipment Recommendations  Rolling walker (2 wheels);BSC/3in1    Recommendations for Other Services       Precautions / Restrictions Precautions Precautions: Fall Precaution Comments: watch BP Restrictions Weight Bearing Restrictions: Yes RLE Weight Bearing: Weight bearing as tolerated     Mobility  Bed Mobility               General bed mobility comments: Pt seated in recliner this session.  Reports feeling busy    Transfers Overall transfer level: Needs assistance Equipment used: Rolling walker (2 wheels) Transfers: Sit to/from Stand, Bed to chair/wheelchair/BSC Sit to Stand: Min guard Stand pivot transfers: Min assist         General transfer comment: Cues for hand placement to and from seated surface this session.  Pt continues to improve in regards to  physical ability but remains limited due to dizziness.    Ambulation/Gait Ambulation/Gait assistance:  (unable d/t dizziness this session.)                 Stairs             Wheelchair Mobility     Tilt Bed    Modified Rankin (Stroke Patients Only)       Balance     Sitting balance-Leahy Scale: Good       Standing balance-Leahy Scale: Fair                              Cognition Arousal/Alertness: Awake/alert Behavior During Therapy: WFL for tasks assessed/performed Overall Cognitive Status: Within Functional Limits for tasks assessed                                 General Comments: fearful of becoming nauseous        Exercises General Exercises - Lower Extremity Ankle Circles/Pumps: AROM, Both, 10 reps, Supine Quad Sets: AROM, Both, 10 reps, Supine Heel Slides: AAROM, Right, 10 reps, Supine Hip ABduction/ADduction: AAROM, Right, 10 reps, Supine    General Comments        Pertinent Vitals/Pain Pain Assessment Pain Assessment: Faces Faces Pain Scale: Hurts little more Pain Location: R hip Pain Descriptors / Indicators: Discomfort, Grimacing, Operative site guarding Pain Intervention(s): Monitored during session, Repositioned  Home Living                          Prior Function            PT Goals (current goals can now be found in the care plan section) Acute Rehab PT Goals Patient Stated Goal: to improve Potential to Achieve Goals: Good Progress towards PT goals: Progressing toward goals    Frequency    Min 1X/week      PT Plan Current plan remains appropriate    Co-evaluation              AM-PAC PT "6 Clicks" Mobility   Outcome Measure  Help needed turning from your back to your side while in a flat bed without using bedrails?: A Little Help needed moving from lying on your back to sitting on the side of a flat bed without using bedrails?: A Little Help needed moving to  and from a bed to a chair (including a wheelchair)?: Total Help needed standing up from a chair using your arms (e.g., wheelchair or bedside chair)?: A Lot Help needed to walk in hospital room?: Total Help needed climbing 3-5 steps with a railing? : Total 6 Click Score: 11    End of Session Equipment Utilized During Treatment: Gait belt Activity Tolerance: Other (comment);Treatment limited secondary to medical complications (Comment) (dizziness and soft BPs.) Patient left: in bed;with call bell/phone within reach;with bed alarm set Nurse Communication: Other (comment) PT Visit Diagnosis: Unsteadiness on feet (R26.81);Other abnormalities of gait and mobility (R26.89);Muscle weakness (generalized) (M62.81);History of falling (Z91.81);Difficulty in walking, not elsewhere classified (R26.2);Pain Pain - Right/Left: Right Pain - part of body: Hip     Time: 1610-9604 PT Time Calculation (min) (ACUTE ONLY): 25 min  Charges:    $Therapeutic Exercise: 8-22 mins $Therapeutic Activity: 8-22 mins PT General Charges $$ ACUTE PT VISIT: 1 Visit                     [name] , PTA Acute Rehabilitation Services Office [phone]    Florestine Avers 03/28/2023, 2:43 PM

## 2023-03-28 NOTE — Progress Notes (Signed)
**Note Beth-Identified via Obfuscation**   Progress Note   Patient: [name]:[id number] DOB: [birth year] DOA: 03/24/2023     4 DOS: the patient was seen and examined on 03/28/2023       Brief hospital course: [name] is an 82 y.o. F with HTN, HLD who presented after a mechanical fall and was found to have R midshaft femoral fracture.    S/p IM nail on 7/22.  Hospital stay complicated by hyponatremia.        Assessment and Plan: * Right femoral shaft fracture (HCC) S/p reduction and internal fixation 03/24/23 by Dr. [name] - WBAT and ROM as tolerated - Aspirin 325 daily at d/c    Hyponatremia due to SIADH Appears euvolemic.  Una and Oosm both inappropriately high.   Na up to 126 today.  Baseline is normal.  Still very weak and dizzy and nauseated.    - Continue salt tabs and fluid restriction - Avoid IV fluids - Trend Na    Vitamin D deficiency - Supplement vit D  Iron deficiency anemia Hgb stable, no clinical bleeding  Essential hypertension BP controlled - Continue metoprolol  Dyslipidemia - Continue statin          Subjective: Patient still feels very nauseated and weak, slightly dizzy and off.  No headache, chest pain, dyspnea, focal weakness, speech changes.  No fever.  No nursing concerns.     Physical Exam: BP (!) 117/45 (BP Location: Left Arm)   Pulse 88   Temp 98.5 F (36.9 C)   Resp 17   Ht 5\' 4"  (1.626 m)   Wt 52 kg   SpO2 91%   BMI 19.68 kg/m   Frail elderly female, lying in bed, appears weak and tired RRR, no murmurs, no peripheral edema Respiratory normal, lungs clear without rales or wheezes Abdomen soft no tenderness palpation or guarding Generalized weakness but symmetric strength, speech fluent, oriented to person, place, and time, overall this appears sluggish and tired    Data Reviewed: Basic metabolic panel shows sodium up to 126, creatinine stable CBC unchanged from yesterday  Family Communication:     Disposition: Status is:  Inpatient Close to medically ready, if Na up again tomorrow, will stop salt tabs and can d/c to SNF        Author: Alberteen Sam, MD 03/28/2023 9:41 AM  For on call review [email or web address].

## 2023-03-29 LAB — SODIUM: Sodium: 124 mmol/L — ABNORMAL LOW (ref 135–145)

## 2023-03-29 MED ORDER — LACTATED RINGERS IV SOLN: INTRAVENOUS | Status: DC

## 2023-03-29 MED ORDER — VITAMIN D 25 MCG (1000 UNIT) PO TABS: 6000.0000 [IU] | ORAL_TABLET | Freq: Every day | ORAL | Status: DC

## 2023-03-29 NOTE — Progress Notes (Signed)
Physical Therapy Treatment Patient Details Name: [name] MRN: [id number] DOB: 03-15-1941 Today's Date: 03/29/2023   History of Present Illness Pt is an 82 y.o. female who presented 03/24/23 s/p fall, sustaining an acute displaced fracture of the right femoral diaphysis. S/p IM nailing of R femoral shaft fx 7/22. PMH: GERD, osteoporosis, HLD    PT Comments  Pt supine in bed on arrival this session eating dinner provided by family.  Pt pleasant and agreeable to PT session.  She was able to advance to short bouts of gt training but BP remains to drop with changes in position.  Pt less dizzy but dizziness remains.    Orthostatic vitals: Lying:123/42 (65) Sitting:116/54 (72) Standing:116/56 (72) Standing after 3 min: 109/48 (62)    Assistance Recommended at Discharge Intermittent Supervision/Assistance  If plan is discharge home, recommend the following:  Can travel by private vehicle    A lot of help with walking and/or transfers;A lot of help with bathing/dressing/bathroom;Assistance with cooking/housework;Assist for transportation;Help with stairs or ramp for entrance   No  Equipment Recommendations  Rolling walker (2 wheels);BSC/3in1    Recommendations for Other Services       Precautions / Restrictions Precautions Precautions: Fall Precaution Comments: watch BP Restrictions Weight Bearing Restrictions: Yes RLE Weight Bearing: Weight bearing as tolerated     Mobility  Bed Mobility Overal bed mobility: Needs Assistance Bed Mobility: Supine to Sit, Sit to Supine     Supine to sit: Supervision Sit to supine: Min assist   General bed mobility comments: Supervision with increased time to move to edge of bed. Min assistance to lift B LEs back into bed.    Transfers Overall transfer level: Needs assistance Equipment used: Rolling walker (2 wheels) Transfers: Sit to/from Stand Sit to Stand: Min guard           General transfer comment: Cues for hand  placement to and from seated surface this session.  Performed from various height with and without arm rests.    Ambulation/Gait Ambulation/Gait assistance: Min assist Gait Distance (Feet): 10 Feet (x2) Assistive device: Rolling walker (2 wheels) Gait Pattern/deviations: Step-to pattern, Narrow base of support, Trunk flexed       General Gait Details: Cues for sequencing and RW safety.  Assist to RW for turns and backing to maintain RW position.  Reports dizziness between trials and required seated rest break.   Stairs             Wheelchair Mobility     Tilt Bed    Modified Rankin (Stroke Patients Only)       Balance Overall balance assessment: Needs assistance Sitting-balance support: Feet supported, Bilateral upper extremity supported Sitting balance-Leahy Scale: Good       Standing balance-Leahy Scale: Fair                              Cognition Arousal/Alertness: Awake/alert Behavior During Therapy: WFL for tasks assessed/performed Overall Cognitive Status: Within Functional Limits for tasks assessed                                          Exercises      General Comments        Pertinent Vitals/Pain Pain Assessment Pain Assessment: Faces Faces Pain Scale: Hurts little more Pain Location: R hip Pain Descriptors / Indicators: Discomfort,  Grimacing, Operative site guarding Pain Intervention(s): Monitored during session, Repositioned    Home Living                          Prior Function            PT Goals (current goals can now be found in the care plan section) Acute Rehab PT Goals Patient Stated Goal: to improve Potential to Achieve Goals: Good Progress towards PT goals: Progressing toward goals    Frequency    Min 1X/week      PT Plan Current plan remains appropriate    Co-evaluation              AM-PAC PT "6 Clicks" Mobility   Outcome Measure  Help needed turning from your  back to your side while in a flat bed without using bedrails?: A Little Help needed moving from lying on your back to sitting on the side of a flat bed without using bedrails?: A Little Help needed moving to and from a bed to a chair (including a wheelchair)?: A Little Help needed standing up from a chair using your arms (e.g., wheelchair or bedside chair)?: A Little Help needed to walk in hospital room?: A Little Help needed climbing 3-5 steps with a railing? : A Little 6 Click Score: 18    End of Session Equipment Utilized During Treatment: Gait belt Activity Tolerance: Other (comment);Treatment limited secondary to medical complications (Comment) Patient left: in bed;with call bell/phone within reach;with bed alarm set Nurse Communication: Other (comment) PT Visit Diagnosis: Unsteadiness on feet (R26.81);Other abnormalities of gait and mobility (R26.89);Muscle weakness (generalized) (M62.81);History of falling (Z91.81);Difficulty in walking, not elsewhere classified (R26.2);Pain Pain - Right/Left: Right Pain - part of body: Hip     Time: 5176-1607 PT Time Calculation (min) (ACUTE ONLY): 29 min  Charges:    $Gait Training: 8-22 mins $Therapeutic Activity: 8-22 mins PT General Charges $$ ACUTE PT VISIT: 1 Visit                     [name] , PTA Acute Rehabilitation Services Office [phone]    Marirose Deveney Artis Delay 03/29/2023, 5:22 PM

## 2023-03-29 NOTE — Plan of Care (Signed)

## 2023-03-29 NOTE — Progress Notes (Signed)
  Progress Note   Patient: MARILENA GORELICK ZOX:[id number] DOB: 17-Sep-1940 DOA: 03/24/2023     5 DOS: the patient was seen and examined on 03/29/2023 at 10:24 AM      Brief hospital course: Mrs. Sax is an 82 y.o. F with HTN, HLD who presented after a mechanical fall and was found to have R midshaft femoral fracture.    S/p IM nail on 7/22.  Hospital stay complicated by hyponatremia.        Assessment and Plan: * Right femoral shaft fracture (HCC) S/p reduction and internal fixation 03/24/23 by Dr. [name] - WBAT and ROM as tolerated - Aspirin 325 daily at d/c    Hyponatremia due to SIADH Appears euvolemic.  Una and Oosm both inappropriately high.   Symptomatically better but sodium down to 121 today.  I presume this is from hypovolemia superimposed on her SIADH - Continue salt tabs - Limiting fluid restriction - Gentle LR - Trend sodium this afternoon and tomorrow morning    Vitamin D deficiency - 6000 units D3 for 8 weeks  Iron deficiency anemia Hgb stable, no clinical bleeding  Essential hypertension BP controlled - Continue metoprolol  Dyslipidemia - Continue statin          Subjective: Feeling somewhat better, less nausea, still not very much appetite and very poor oral intake she reports, very dizzy with standing yesterday, that is not as bad today, no fever, no urinary symptoms, no chest pain, no headache, no nursing concerns     Physical Exam: BP (!) 116/54 (BP Location: Right Arm)   Pulse 91   Temp 98.3 F (36.8 C)   Resp 16   Ht 5\' 4"  (1.626 m)   Wt 52 kg   SpO2 98%   BMI 19.68 kg/m   Frail elderly female, sitting up in recliner, interactive and appropriate RRR, no murmurs, no peripheral edema Respiratory normal, lungs clear without rales or wheezes Abdomen soft Attention normal, affect appropriate, judgment and insight appear normal, generalized weakness but symmetric strength, leg strength congested    Data Reviewed: Basic  metabolic panel shows sodium at 121, potassium and renal function normal       Disposition: Status is: Inpatient         Author: Alberteen Sam, MD 03/29/2023 2:40 PM  For on call review [email or web address].

## 2023-03-29 NOTE — TOC Progression Note (Signed)
Transition of Care [facility]) - Progression Note    Patient Details  Name: VIANNI BALICKI MRN: [id number] Date of Birth: [birth year]  Transition of Care [facility]) CM/SW Contact  Jimmy Picket, [place] Phone Number: 03/29/2023, 12:34 PM  Clinical Narrative:     CSW spoke to admissions coordinator at [facility]. Patients insurance [facility] Harvey is still pending.   Expected Discharge Plan: Skilled Nursing Facility Barriers to Discharge: English as a second language teacher, Continued Medical Work up  Expected Discharge Plan and Services                                               Social Determinants of Health (SDOH) Interventions SDOH Screenings   Food Insecurity: No Food Insecurity (03/24/2023)  Housing: Low Risk  (03/24/2023)  Transportation Needs: No Transportation Needs (03/24/2023)  Utilities: Not At Risk (03/24/2023)  Financial Resource Strain: Low Risk  (02/17/2023)   Received from [facility], [facility]  Tobacco Use: Low Risk  (03/24/2023)    Readmission Risk Interventions     No data to display         Jimmy Picket, LCSW Clinical Social Worker

## 2023-03-30 DIAGNOSIS — I10 Essential (primary) hypertension: Secondary | ICD-10-CM | POA: Diagnosis not present

## 2023-03-30 DIAGNOSIS — S72301A Unspecified fracture of shaft of right femur, initial encounter for closed fracture: Secondary | ICD-10-CM | POA: Diagnosis not present

## 2023-03-30 DIAGNOSIS — E785 Hyperlipidemia, unspecified: Secondary | ICD-10-CM | POA: Diagnosis not present

## 2023-03-30 DIAGNOSIS — E871 Hypo-osmolality and hyponatremia: Secondary | ICD-10-CM | POA: Diagnosis not present

## 2023-03-30 MED ORDER — VITAMIN D 25 MCG (1000 UNIT) PO TABS
1000.0000 [IU] | ORAL_TABLET | Freq: Every day | ORAL | Status: DC
  Administered 2023-03-30 – 2023-04-04 (×6): 1000 [IU] via ORAL
  Filled 2023-03-30 (×6): qty 1

## 2023-03-30 MED ORDER — CALCIUM CARBONATE 1250 (500 CA) MG PO TABS
1.0000 | ORAL_TABLET | Freq: Every day | ORAL | Status: DC
  Administered 2023-03-30 – 2023-04-04 (×6): 1250 mg via ORAL
  Filled 2023-03-30 (×6): qty 1

## 2023-03-30 NOTE — Plan of Care (Signed)
  Problem: Education: Goal: Knowledge of General Education information will improve Description: Including pain rating scale, medication(s)/side effects and non-pharmacologic comfort measures Outcome: Progressing   Problem: Activity: Goal: Risk for activity intolerance will decrease Outcome: Progressing   Problem: Pain Managment: Goal: General experience of comfort will improve Outcome: Progressing   

## 2023-03-30 NOTE — Progress Notes (Signed)
  Progress Note   Patient: [name] ZOX:[id number] DOB: December 24, 1940 DOA: 03/24/2023     6 DOS: the patient was seen and examined on 03/30/2023        Brief hospital course: [name] is an 82 y.o. F with HTN, HLD who presented after a mechanical fall and was found to have R midshaft femoral fracture.    S/p IM nail on 7/22.  Hospital stay complicated by hyponatremia.        Assessment and Plan: * Right femoral shaft fracture (HCC) S/p reduction and internal fixation 03/24/23 by Dr. [name] - WBAT and ROM as tolerated - Aspirin 325 daily at d/c    Hyponatremia due to SIADH Suspect there is a combination of hyponatremia due to SIADH as well as hypovolemia.  This improved yesterday with IV fluids.  Persistently poor oral intake has been a problem due to ongoing nausea, mostly provoked by pills. - Stop IV fluids - Oral rehydration - Continue salt tabs - Repeat BMP tomorrow     Vitamin D deficiency - Supplement vit D and calcium  Iron deficiency anemia Hgb stable, no clinical bleeding  Essential hypertension BP normal - Continue metoprolol  Dyslipidemia - Continue statin          Subjective: Patient has ongoing nausea provoked by pills.  No focal weakness, confusion, chest pain, dyspnea.     Physical Exam: BP 129/62 (BP Location: Left Arm)   Pulse 92   Temp 97.8 F (36.6 C)   Resp 15   Ht 5\' 4"  (1.626 m)   Wt 52 kg   SpO2 97%   BMI 19.68 kg/m   Frail elderly female, sitting up in recliner, in no Respiratory rate normal, lungs clear without rales or wheezes RRR, no murmurs, no peripheral edema Abdomen soft without tenderness palpation or guarding blunted, psychomotor slowing mild, oriented to person, place, and time,.  Very generally weak, but symmetric, speech fluent    Data Reviewed: Serum sodium last night was improved up to 125       Disposition: Status is: Inpatient         Author: Alberteen Sam,  MD 03/30/2023 11:28 AM  For on call review [email or web address].

## 2023-03-30 NOTE — Plan of Care (Signed)

## 2023-03-31 ENCOUNTER — Ambulatory Visit: Payer: Medicare HMO

## 2023-03-31 ENCOUNTER — Inpatient Hospital Stay (HOSPITAL_COMMUNITY): Payer: Medicare HMO

## 2023-03-31 DIAGNOSIS — I480 Paroxysmal atrial fibrillation: Secondary | ICD-10-CM | POA: Diagnosis not present

## 2023-03-31 DIAGNOSIS — I4891 Unspecified atrial fibrillation: Secondary | ICD-10-CM

## 2023-03-31 DIAGNOSIS — S72301A Unspecified fracture of shaft of right femur, initial encounter for closed fracture: Secondary | ICD-10-CM | POA: Diagnosis not present

## 2023-03-31 DIAGNOSIS — E785 Hyperlipidemia, unspecified: Secondary | ICD-10-CM | POA: Diagnosis not present

## 2023-03-31 DIAGNOSIS — E871 Hypo-osmolality and hyponatremia: Secondary | ICD-10-CM | POA: Diagnosis not present

## 2023-03-31 LAB — OSMOLALITY, URINE: Osmolality, Ur: 575 mOsm/kg (ref 300–900)

## 2023-03-31 LAB — ECHOCARDIOGRAM COMPLETE
AR max vel: 1.9 cm2
AV Area VTI: 1.95 cm2
AV Area mean vel: 1.84 cm2
AV Mean grad: 3 mmHg
AV Peak grad: 6.2 mmHg
AV Vena cont: 0.2 cm
Ao pk vel: 1.24 m/s
Area-P 1/2: 3.99 cm2
Est EF: 50
Height: 64 in
S' Lateral: 2.3 cm
Weight: 1834.23 oz

## 2023-03-31 LAB — BASIC METABOLIC PANEL
Anion gap: 9 (ref 5–15)
BUN: 37 mg/dL — ABNORMAL HIGH (ref 8–23)
CO2: 25 mmol/L (ref 22–32)
Calcium: 8.6 mg/dL — ABNORMAL LOW (ref 8.9–10.3)
Chloride: 87 mmol/L — ABNORMAL LOW (ref 98–111)
Creatinine, Ser: 0.48 mg/dL (ref 0.44–1.00)
GFR, Estimated: >60 mL/min (ref >60)
Glucose, Bld: 101 mg/dL — ABNORMAL HIGH (ref 70–99)
Potassium: 4.5 mmol/L (ref 3.5–5.1)
Sodium: 121 mmol/L — ABNORMAL LOW (ref 135–145)

## 2023-03-31 LAB — SODIUM, URINE, RANDOM: Sodium, Ur: 99 mmol/L

## 2023-03-31 LAB — TSH: TSH: 1.573 u[IU]/mL (ref 0.350–4.500)

## 2023-03-31 LAB — MAGNESIUM: Magnesium: 1.8 mg/dL (ref 1.7–2.4)

## 2023-03-31 LAB — OSMOLALITY: Osmolality: 257 mOsm/kg — ABNORMAL LOW (ref 275–295)

## 2023-03-31 LAB — TROPONIN I (HIGH SENSITIVITY): Troponin I (High Sensitivity): 6 ng/L (ref <18)

## 2023-03-31 MED ORDER — UREA 15 G PO PACK
30.0000 g | PACK | Freq: Two times a day (BID) | ORAL | Status: DC
  Administered 2023-03-31 – 2023-04-03 (×7): 30 g via ORAL
  Filled 2023-03-31 (×10): qty 2

## 2023-03-31 MED ORDER — METOPROLOL TARTRATE 25 MG PO TABS
25.0000 mg | ORAL_TABLET | Freq: Two times a day (BID) | ORAL | Status: DC
  Administered 2023-03-31 – 2023-04-01 (×3): 25 mg via ORAL
  Filled 2023-03-31 (×3): qty 1

## 2023-03-31 MED ORDER — METOPROLOL TARTRATE 5 MG/5ML IV SOLN
5.0000 mg | Freq: Once | INTRAVENOUS | Status: AC
  Administered 2023-03-31: 5 mg via INTRAVENOUS
  Filled 2023-03-31: qty 5

## 2023-03-31 MED ORDER — FUROSEMIDE 10 MG/ML IJ SOLN: 20.0000 mg | Freq: Two times a day (BID) | INTRAMUSCULAR | Status: DC

## 2023-03-31 NOTE — TOC Progression Note (Addendum)
Transition of Care [facility]) - Progression Note    Patient Details  Name: [name] MRN: [id number] Date of Birth: 16-Feb-1941  Transition of [facility]) CM/SW Contact  Lorri Frederick, LCSW Phone Number: 03/31/2023, 10:45 AM  Clinical Narrative:  CSW received message from Tammy[facility].  Checking on status of auth now.    1510: Message from [employer].  [facility] has been approved for SNF.    Expected Discharge Plan: Skilled Nursing Facility Barriers to Discharge: English as a second language teacher, Continued Medical Work up  Expected Discharge Plan and Services                                               Social Determinants of Health (SDOH) Interventions SDOH Screenings   Food Insecurity: No Food Insecurity (03/24/2023)  Housing: Low Risk  (03/24/2023)  Transportation Needs: No Transportation Needs (03/24/2023)  Utilities: Not At Risk (03/24/2023)  Financial Resource Strain: Low Risk  (02/17/2023)   Received from [facility], [facility]  Tobacco Use: Low Risk  (03/24/2023)    Readmission Risk Interventions     No data to display

## 2023-03-31 NOTE — Plan of Care (Signed)

## 2023-03-31 NOTE — Progress Notes (Signed)
PT Cancellation Note  Patient Details Name: [name] MRN: [id number] DOB: 1941/05/01   Cancelled Treatment:    Reason Eval/Treat Not Completed: (P) Medical issues which prohibited therapy;Patient at procedure or test/unavailable (Pt at procedure ( ECHO ) will f/u per POC.)   [name] 03/31/2023, 3:40 PM  [name] , PTA Acute Rehabilitation Services Office [phone]

## 2023-03-31 NOTE — Consult Note (Signed)
[facility]  HISTORY AND PHYSICAL  [name] is an 82 y.o. female.    Chief Complaint: Hip fracture  HPI: Pt is an 32F with a PMH sig for HTN, HLD, who presented to [facility] after a fall and was found to have a hip fracture.  She is s/p IM nail on 03/24/23.  Her hospital course has been complicated by worsening hyponatremia.  Was initially 128 on admission.  Has overall drifted down s/p LR.  Initially had fluid restriction, now off.  BP is not very high.  Is on salt table 1 g TID.    Urine osms from 03/26/23 suggest SIADH but her PO intake has been poor and Na has been fluctating in a 5-point range.  Na is 121 this AM.    Pt is sitting up in the chair.  She says that she's had ongoing nausea since the OR essentially.   No headaches/ blurry vision/ seizure activity.    In this setting we are asked to see.     PMH: Past Medical History:  Diagnosis Date   Actinic keratosis 11/10/2019   Left superior shoulder. Bx proven.   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 SCCis   PSH: Past Surgical History:  Procedure Laterality Date   ABDOMINAL HYSTERECTOMY     partial   CARDIAC CATHETERIZATION     [facility]   cataract     COLONOSCOPY WITH PROPOFOL N/A 06/22/2015   Procedure: COLONOSCOPY WITH PROPOFOL;  Surgeon: Wallace Cullens, MD;  Location: [facility] ENDOSCOPY;  Service: Gastroenterology;  Laterality: N/A;   ESOPHAGOGASTRODUODENOSCOPY (EGD) WITH PROPOFOL N/A 06/22/2015   Procedure: ESOPHAGOGASTRODUODENOSCOPY (EGD) WITH PROPOFOL;  Surgeon: Wallace Cullens, MD;  Location: [facility] ENDOSCOPY;  Service: Gastroenterology;  Laterality: N/A;   ESOPHAGOGASTRODUODENOSCOPY (EGD) WITH PROPOFOL N/A 07/12/2020    Procedure: ESOPHAGOGASTRODUODENOSCOPY (EGD) WITH PROPOFOL;  Surgeon: Toledo, Boykin Nearing, MD;  Location: [facility] ENDOSCOPY;  Service: Gastroenterology;  Laterality: N/A;   INTRAMEDULLARY (IM) NAIL INTERTROCHANTERIC Right 03/24/2023   Procedure: INTRAMEDULLARY (IM) NAIL FEMUR;  Surgeon: Roby Lofts, MD;  Location: [facility] OR;  Service: Orthopedics;  Laterality: Right;   PARTIAL HYSTERECTOMY     TONSILLECTOMY       Past Medical History:  Diagnosis Date   Actinic keratosis 11/10/2019   Left superior shoulder. Bx proven.   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     famotidine (PEPCID) 40 MG tablet Take 40 mg by mouth at bedtime.     lovastatin (MEVACOR) 40 MG tablet Take 40 mg by mouth at bedtime.     metoprolol succinate (TOPROL-XL) 25 MG 24 hr tablet Take 25 mg by mouth daily.     Multiple Vitamin (MULTIVITAMIN) tablet Take 1 tablet by mouth daily.     omeprazole (PRILOSEC) 40 MG capsule Take 40 mg by mouth daily.     [DISCONTINUED] aspirin 81 MG EC tablet Take 81 mg by mouth daily.     triamcinolone (KENALOG) 0.025 % ointment Apply 1  Application topically 2 (two) times daily. For 3-5 days. (Patient not taking: Reported on 03/25/2023) 30 g 0   [DISCONTINUED] ondansetron (ZOFRAN-ODT) 4 MG disintegrating tablet Take 1 tablet (4 mg total) by mouth every 8 (eight) hours as needed for nausea or vomiting. (Patient not taking: Reported on 03/25/2023) 12 tablet 0    ALLERGIES:   Allergies  Allergen Reactions   Bisphosphonates Other (See Comments)    Swallowing problems    Boniva [Ibandronic Acid] Other (See Comments)    Swallowing problems    Fosamax [Alendronate Sodium] Other (See Comments)    Swallowing problems    Lipitor [Atorvastatin] Other (See Comments)    Knee pain    Etodolac Rash    FAM HX: Family History  Problem Relation Age of Onset   Heart failure Mother        CHF   Transient ischemic attack Mother    Heart disease Father    Heart failure Father    Coronary artery disease Other    Breast cancer Maternal Aunt 67    Social History:   reports that she has never smoked. She has been exposed to tobacco smoke. She has never used smokeless tobacco. She reports that she does not drink alcohol and does not use drugs.  ROS: ROS: all other systems reviewed and are negative except as per HPI  Blood pressure 93/62, pulse (!) 116, temperature 98.2 F (36.8 C), temperature source Oral, resp. rate 18, height 5\' 4"  (1.626 m), weight 52 kg, SpO2 100%. PHYSICAL EXAM: Physical Exam GEN NAD, sitting in chair HEENT EOMI PERRL NECKno JVD PULM  clear CV RRR ABD soft EXT  slight RLE swelling. No LLE swelling NEURO AAO x 3 nonfocal SKIN + good turgor   Results for orders placed or performed during the hospital encounter of 03/24/23 (from the past 48 hour(s))  Sodium     Status: Abnormal   Collection Time: 03/29/23  5:57 PM  Result Value Ref Range   Sodium 124 (L) 135 - 145 mmol/L    Comment: Performed at [facility], [address] [place], [place] [address]  Basic metabolic panel     Status: Abnormal    Collection Time: 03/31/23  3:08 AM  Result Value Ref Range   Sodium 121 (L) 135 - 145 mmol/L   Potassium 4.0 3.5 - 5.1 mmol/L   Chloride 89 (L) 98 - 111 mmol/L   CO2 25 22 - 32 mmol/L   Glucose, Bld 118 (H) 70 - 99 mg/dL    Comment: Glucose reference range applies only to samples taken after fasting for at least 8 hours.  BUN 10 8 - 23 mg/dL   Creatinine, Ser 6.64 (L) 0.44 - 1.00 mg/dL   Calcium 8.0 (L) 8.9 - 10.3 mg/dL   GFR, Estimated >40 >34 mL/min    Comment: (NOTE) Calculated using the CKD-EPI Creatinine Equation (2021)    Anion gap 7 5 - 15    Comment: Performed at [facility], [address], [place], [place] [address]  Osmolality     Status: Abnormal   Collection Time: 03/31/23  9:28 AM  Result Value Ref Range   Osmolality 257 (L) 275 - 295 mOsm/kg    Comment: Performed at [facility], [address] [place], [place] [address]  Troponin I (High Sensitivity)     Status: None   Collection Time: 03/31/23  9:28 AM  Result Value Ref Range   Troponin I (High Sensitivity) 6 <18 ng/L    Comment: (NOTE) Elevated high sensitivity troponin I (hsTnI) values and significant  changes across serial measurements may suggest ACS but many other  chronic and acute conditions are known to elevate hsTnI results.  Refer to the "Links" section for chest pain algorithms and additional  guidance. Performed at [facility], [address], [place], [place] [address]   TSH     Status: None   Collection Time: 03/31/23 10:04 AM  Result Value Ref Range   TSH 1.573 0.350 - 4.500 uIU/mL    Comment: Performed by a 3rd Generation assay with a functional sensitivity of <=0.01 uIU/mL. Performed at [facility], [address], [place], [place] [address]     No results found.  Assessment/Plan  Hyponatremia:  likely mixed picture of SIADH and hypovolemia - placed on 1L fluid restriction, strict I/O, daily weights - TSH WNL - add UreNa packets 30 g BID - PM BMP -  may need low-dose Lasix  - no indication for 3% at present - check AM cortisol - repeat urine and serum osms  2.  R hip fracture: s/p IM nail  3.  HTN:  - off antihypertensives  4.  Dispo: pending  Marrio Scribner 03/31/2023, 1:22 PM

## 2023-03-31 NOTE — Plan of Care (Signed)

## 2023-03-31 NOTE — Care Management Important Message (Signed)
Important Message  Patient Details  Name: [name] MRN: [id number] Date of Birth: 21-Aug-1941   Medicare Important Message Given:  Yes     Sherilyn Banker 03/31/2023, 1:23 PM

## 2023-03-31 NOTE — Progress Notes (Signed)
*  PRELIMINARY RESULTS* Echocardiogram 2D Echocardiogram has been performed.  [name] 03/31/2023, 4:11 PM

## 2023-03-31 NOTE — Plan of Care (Signed)
  Problem: Education: Goal: Knowledge of General Education information will improve Description: Including pain rating scale, medication(s)/side effects and non-pharmacologic comfort measures 03/31/2023 1637 by Olga Millers, LPN Outcome: Progressing 03/31/2023 1633 by Olga Millers, LPN Outcome: Progressing   Problem: Health Behavior/Discharge Planning: Goal: Ability to manage health-related needs will improve 03/31/2023 1637 by Olga Millers, LPN Outcome: Progressing 03/31/2023 1633 by Olga Millers, LPN Outcome: Progressing   Problem: Clinical Measurements: Goal: Ability to maintain clinical measurements within normal limits will improve 03/31/2023 1637 by Olga Millers, LPN Outcome: Progressing 03/31/2023 1633 by Olga Millers, LPN Outcome: Progressing Goal: Will remain free from infection 03/31/2023 1637 by Olga Millers, LPN Outcome: Progressing 03/31/2023 1633 by Olga Millers, LPN Outcome: Progressing Goal: Diagnostic test results will improve 03/31/2023 1637 by Olga Millers, LPN Outcome: Progressing 03/31/2023 1633 by Olga Millers, LPN Outcome: Progressing Goal: Respiratory complications will improve 03/31/2023 1637 by Olga Millers, LPN Outcome: Progressing 03/31/2023 1633 by Olga Millers, LPN Outcome: Progressing Goal: Cardiovascular complication will be avoided 03/31/2023 1637 by Olga Millers, LPN Outcome: Progressing 03/31/2023 1633 by Olga Millers, LPN Outcome: Progressing   Problem: Activity: Goal: Risk for activity intolerance will decrease 03/31/2023 1637 by Olga Millers, LPN Outcome: Progressing 03/31/2023 1633 by Olga Millers, LPN Outcome: Progressing   Problem: Nutrition: Goal: Adequate nutrition will be maintained 03/31/2023 1637 by Olga Millers, LPN Outcome: Progressing 03/31/2023 1633 by Olga Millers, LPN Outcome: Progressing   Problem: Coping: Goal: Level of anxiety will decrease 03/31/2023 1637 by Olga Millers, LPN Outcome:  Progressing 03/31/2023 1633 by Olga Millers, LPN Outcome: Progressing   Problem: Elimination: Goal: Will not experience complications related to bowel motility 03/31/2023 1637 by Olga Millers, LPN Outcome: Progressing 03/31/2023 1633 by Olga Millers, LPN Outcome: Progressing Goal: Will not experience complications related to urinary retention 03/31/2023 1637 by Olga Millers, LPN Outcome: Progressing 03/31/2023 1633 by Olga Millers, LPN Outcome: Progressing   Problem: Pain Managment: Goal: General experience of comfort will improve 03/31/2023 1637 by Olga Millers, LPN Outcome: Progressing 03/31/2023 1633 by Olga Millers, LPN Outcome: Progressing   Problem: Safety: Goal: Ability to remain free from injury will improve 03/31/2023 1637 by Olga Millers, LPN Outcome: Progressing 03/31/2023 1633 by Olga Millers, LPN Outcome: Progressing   Problem: Skin Integrity: Goal: Risk for impaired skin integrity will decrease 03/31/2023 1637 by Olga Millers, LPN Outcome: Progressing 03/31/2023 1633 by Olga Millers, LPN Outcome: Progressing

## 2023-03-31 NOTE — Assessment & Plan Note (Signed)
New onset.  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  TSH normal.   Echo with mildly reduced EF.  Mag close to normal  Back in Afib overnight.  Asymptomatic. - Titrate up metoprolol - Start Eliquis, 2.5BID given age, weight - Follow up with Cardiology after dicharge - CBC in 1 week

## 2023-03-31 NOTE — Progress Notes (Signed)
  Progress Note   Patient: [name] ZOX:[id number] DOB: 01-13-1941 DOA: 03/24/2023     7 DOS: the patient was seen and examined on 03/31/2023       Brief hospital course: [name] is an 82 y.o. F with HTN, HLD who presented after a mechanical fall and was found to have R midshaft femoral fracture.    S/p IM nail on 7/22.  Hospital stay complicated by hyponatremia.        Assessment and Plan: * Right femoral shaft fracture (HCC) S/p reduction and internal fixation 03/24/23 by Dr. [name] - WBAT and ROM as tolerated - Aspirin 325 daily at d/c    Hyponatremia due to SIADH Appears euvolemic.  Una and Oosm both inappropriately high.   Na up to 126 today.  Baseline is normal.  Still very weak and dizzy and nauseated.    - Continue salt tabs and fluid restriction - Avoid IV fluids - Trend Na    Paroxysmal atrial fibrillation (HCC) New onset.  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  TSH normal.   - Check mag - Continue new higher dose metoprolol - Obtain echo - Given rapid reversion to sinus today with IV metoprolol, will defer anticoagulation and plan for short interval follow up with her Cardiologist Dr. Lady Gary    Vitamin D deficiency - Supplement vit D  Iron deficiency anemia Hgb stable, no clinical bleeding  Essential hypertension BP controlled - Continue metoprolol  Dyslipidemia - Continue statin          Subjective: This morning complained of some chest discomfort last night and this morning, no palpitations, no leg swelling, no dyspnea.  ECG showed A-fib with RVR.  Troponin negative.  Nephrology consulted.     Physical Exam: BP 93/62 (BP Location: Right Arm)   Pulse (!) 116   Temp 98.2 F (36.8 C) (Oral)   Resp 18   Ht 5\' 4"  (1.626 m)   Wt 52 kg   SpO2 100%   BMI 19.68 kg/m   Frail elderly female, sitting up in recliner, interactive and appropriate Tachycardic, thready, no murmurs, no peripheral  edema, no JVD Respiratory normal, lungs clear without rales or wheezes Abdomen soft without tenderness palpation Attention normal, affect appropriate, judgment and insight appear normal    Data Reviewed: ECG, personally reviewed, shows A-fib with rapid ventricular rate TSH normal Patient metabolic panel showed normal potassium and renal function, worsening hyponatremia    Family Communication: Husband and son by the bedside    Disposition: Status is: Inpatient         Author: Alberteen Sam, MD 03/31/2023 4:20 PM  For on call review [email or web address].

## 2023-04-01 ENCOUNTER — Other Ambulatory Visit (HOSPITAL_COMMUNITY): Payer: Self-pay

## 2023-04-01 DIAGNOSIS — I480 Paroxysmal atrial fibrillation: Secondary | ICD-10-CM | POA: Diagnosis not present

## 2023-04-01 DIAGNOSIS — S72301A Unspecified fracture of shaft of right femur, initial encounter for closed fracture: Secondary | ICD-10-CM | POA: Diagnosis not present

## 2023-04-01 DIAGNOSIS — E871 Hypo-osmolality and hyponatremia: Secondary | ICD-10-CM | POA: Diagnosis not present

## 2023-04-01 DIAGNOSIS — E785 Hyperlipidemia, unspecified: Secondary | ICD-10-CM | POA: Diagnosis not present

## 2023-04-01 LAB — RENAL FUNCTION PANEL
Albumin: 2.4 g/dL — ABNORMAL LOW (ref 3.5–5.0)
Anion gap: 8 (ref 5–15)
BUN: 32 mg/dL — ABNORMAL HIGH (ref 8–23)
CO2: 26 mmol/L (ref 22–32)
Calcium: 8.8 mg/dL — ABNORMAL LOW (ref 8.9–10.3)
Chloride: 89 mmol/L — ABNORMAL LOW (ref 98–111)
Creatinine, Ser: 0.4 mg/dL — ABNORMAL LOW (ref 0.44–1.00)
GFR, Estimated: >60 mL/min (ref >60)
Glucose, Bld: 105 mg/dL — ABNORMAL HIGH (ref 70–99)
Phosphorus: 2.8 mg/dL (ref 2.5–4.6)
Potassium: 4.1 mmol/L (ref 3.5–5.1)
Sodium: 123 mmol/L — ABNORMAL LOW (ref 135–145)

## 2023-04-01 MED ORDER — METOPROLOL TARTRATE 5 MG/5ML IV SOLN
2.5000 mg | Freq: Once | INTRAVENOUS | Status: AC
  Administered 2023-04-01: 2.5 mg via INTRAVENOUS
  Filled 2023-04-01: qty 5

## 2023-04-01 MED ORDER — FUROSEMIDE 10 MG/ML IJ SOLN
20.0000 mg | Freq: Two times a day (BID) | INTRAMUSCULAR | Status: DC
  Administered 2023-04-01 – 2023-04-02 (×2): 20 mg via INTRAVENOUS
  Filled 2023-04-01 (×3): qty 2

## 2023-04-01 MED ORDER — METOPROLOL TARTRATE 50 MG PO TABS
50.0000 mg | ORAL_TABLET | Freq: Two times a day (BID) | ORAL | Status: DC
  Administered 2023-04-01 – 2023-04-02 (×2): 50 mg via ORAL
  Filled 2023-04-01 (×2): qty 1

## 2023-04-01 MED ORDER — APIXABAN 2.5 MG PO TABS
2.5000 mg | ORAL_TABLET | Freq: Two times a day (BID) | ORAL | Status: DC
  Administered 2023-04-01 – 2023-04-04 (×7): 2.5 mg via ORAL
  Filled 2023-04-01 (×7): qty 1

## 2023-04-01 MED ORDER — FUROSEMIDE 10 MG/ML IJ SOLN
40.0000 mg | Freq: Once | INTRAMUSCULAR | Status: AC
  Administered 2023-04-01: 40 mg via INTRAVENOUS
  Filled 2023-04-01: qty 4

## 2023-04-01 NOTE — TOC Benefit Eligibility Note (Signed)
Pharmacy Patient Advocate Encounter  Insurance verification completed.    The patient is insured through [employer] Part D   Ran test claim for Eliquis Starter Pack. Currently a quantity of 74 is a 30 day supply and the co-pay is $47.00 .   Ran test claim for Eliquis. Currently a quantity of 60 is a 30 day supply and the co-pay is $47.00 .   This test claim was processed through [facility]- copay amounts may vary at other pharmacies due to pharmacy/plan contracts, or as the patient moves through the different stages of their insurance plan.

## 2023-04-01 NOTE — Progress Notes (Signed)
OT Cancellation Note  Patient Details Name: [name] MRN: [id number] DOB: 02/29/1941   Cancelled Treatment:    Reason Eval/Treat Not Completed: Medical issues which prohibited therapy (Pt HR 134-146 bpm at rest, highest point was 151 bpm. Therapy deferred, she just worked with mobility to get to recliner. She reports starting eliquis to help with HR, OT to continue to follow up with patient as able.)  04/01/2023  AB, OTR/L  Acute Rehabilitation Services  Office: [phone]   Tristan Schroeder 04/01/2023, 4:06 PM

## 2023-04-01 NOTE — Plan of Care (Signed)

## 2023-04-01 NOTE — Progress Notes (Signed)
Mobility Specialist: Progress Note   04/01/23 1426  Mobility  Activity Transferred from bed to chair  Level of Assistance Contact guard assist, steadying assist  Assistive Device Front wheel walker  Distance Ambulated (ft) 3 ft  RLE Weight Bearing WBAT  Activity Response Tolerated well  Mobility Referral Yes  $Mobility charge 1 Mobility  Mobility Specialist Start Time (ACUTE ONLY) 1410  Mobility Specialist Stop Time (ACUTE ONLY) 1425  Mobility Specialist Time Calculation (min) (ACUTE ONLY) 15 min   Pre mobility: HR 108 afib During Mobility: HR 130-140 afib  Post Mobility: HR: 110-117 afib  Pt was agreeable to mobility session with encouragement - received in bed. MinG for STS, but pt complained of dizziness - HR increased to 130-140 bpm, returned to 110-117 bpm at EOS. Deferred ambulation at this time d/t incr HR/symptomatic. Left in chair with all needs met, chair alarm on. Call bell in reach.   Maurene Capes Mobility Specialist Please contact via SecureChat or Rehab office at [phone]

## 2023-04-01 NOTE — Progress Notes (Signed)
  Progress Note   Patient: [name] ZOX:[id number] DOB: 13-Jul-1941 DOA: 03/24/2023     8 DOS: the patient was seen and examined on 04/01/2023 at 9:20AM      Brief hospital course: Mrs. Mole is an 82 y.o. F with HTN, HLD who presented after a mechanical fall and was found to have R midshaft femoral fracture.    S/p IM nail on 7/22.  Hospital stay complicated by hyponatremia, recalcitrant, and new onset A-fib.        Assessment and Plan: * Right femoral shaft fracture (HCC) S/p reduction and internal fixation 03/24/23 by Dr. 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  TSH normal.   Echo with mildly reduced EF.  Mag close to normal  Back in Afib overnight.  Asymptomatic. - Titrate up metoprolol - Start Eliquis, 2.5BID given age, weight - Follow up with Cardiology after dicharge - CBC in 1 week    Vitamin D deficiency - Supplement vit D, Ca  Iron deficiency anemia Hgb stable, no clinical bleeding  Essential hypertension BP controlled - Continue metoprolol  Dyslipidemia - Continue statin          Subjective: She had some right upper back pain and shoulder pain yesterday, some neck pain.  Obviously she still has hip pain.  No significant dyspnea, no leg swelling, no confusion, no dizziness, no passing out, no seizures.  No nursing concerns.     Physical Exam: BP 123/61 (BP Location: Left Arm)   Pulse (!) 107   Temp 98 F (36.7 C)   Resp 18   Ht 5\' 4"  (1.626 m)   Wt 53.8 kg   SpO2 100%   BMI 20.36 kg/m   Frail elderly female, sitting up in recliner, interactive and appropriate Heart tachycardic, irregular, no murmurs, no peripheral edema Respiratory  rate normal, lungs clear without rales or wheezes Abdomen soft without tenderness palpation or guarding Attention normal, affect appropriate, judgment and insight appear baseline, face symmetric, speech fluent, moves upper extremities with normal strength and coordination    Data Reviewed: Discussed with nephrology ECG telemetry personally reviewed, shows recurrent atrial fibrillation TSH normal Magnesium normal Echocardiogram shows mildly reduced EF, normal valves, normal RV function and no regional wall motion abnormality metabolic panel shows persistent hyponatremia   Family Communication: None present    Disposition: Status is: Inpatient         Author: Alberteen Sam, MD 04/01/2023 11:44 AM  For on call review [email or web address].

## 2023-04-01 NOTE — Progress Notes (Signed)
[facility] Progress Note   Assessment/ Plan:   Hyponatremia:  likely mixed picture of SIADH and hypovolemia - placed on 1L fluid restriction, strict I/O, daily weights - TSH WNL - add UreNa packets 30 g BID - added Lasix this AM - no indication for 3% at present - check AM cortisol- WNL - repeat urine and serum osms reflective of SIADH   2.  R hip fracture: s/p IM nail   3.  HTN:             - off antihypertensives  4.  New onset Afib:  - TTE 50% EF  - dilated LA  - on metop and Eliquis   5.  Dispo: pending  Subjective:    Seen in room.  UreNa added yesterday, Na hovering around 121.  Repeat urine lytes still reflective of SIADH.  Lasix added this AM.   Objective:   BP 123/61 (BP Location: Left Arm)   Pulse (!) 107   Temp 98 F (36.7 C)   Resp 18   Ht 5\' 4"  (1.626 m)   Wt 53.8 kg   SpO2 100%   BMI 20.36 kg/m   Intake/Output Summary (Last 24 hours) at 04/01/2023 1053 Last data filed at 04/01/2023 0840 Gross per 24 hour  Intake 110 ml  Output --  Net 110 ml   Weight change:   Physical Exam: GEN NAD, sitting in chair HEENT EOMI PERRL NECKno JVD PULM  clear CV RRR ABD soft EXT  slight RLE swelling. No LLE swelling NEURO AAO x 3 nonfocal SKIN + good turgor  Imaging: ECHOCARDIOGRAM COMPLETE  Result Date: 03/31/2023    ECHOCARDIOGRAM REPORT   Patient Name:   MILIANNA KEIBLER Date of Exam: 03/31/2023 Medical Rec #:  [id number]         Height:       64.0 in Accession #:    [id number]        Weight:       114.6 lb Date of Birth:  1941-04-23         BSA:          1.544 m Patient Age:    81 years          BP:           110/70 mmHg Patient Gender: F                 HR:           89 bpm. Exam Location:  Inpatient Procedure: 2D Echo, Cardiac Doppler and Color Doppler Indications:    Atrial Fibrillation I48.91  History:        Patient has no prior history of Echocardiogram examinations.                 CAD, Mitral Valve Disease; Risk Factors:Dyslipidemia,  Non-Smoker                 and Hypertension.  Sonographer:    Dondra Prader RVT RCS Referring Phys: [phone] CHRISTOPHER P DANFORD IMPRESSIONS  1. Left ventricular ejection fraction, by estimation, is 50%. The left ventricle has mildly decreased function. The left ventricle demonstrates global hypokinesis. Left ventricular diastolic parameters are consistent with Grade I diastolic dysfunction (impaired relaxation).  2. Right ventricular systolic function is normal. The right ventricular size is normal. There is normal pulmonary artery systolic pressure. The estimated right ventricular systolic pressure is 22.5 mmHg.  3. Left atrial size was mildly dilated.  4. Right  atrial size was mildly dilated.  5. The mitral valve is normal in structure. Mild mitral valve regurgitation. No evidence of mitral stenosis.  6. The aortic valve is tricuspid. There is mild calcification of the aortic valve. Aortic valve regurgitation is trivial. No aortic stenosis is present.  7. The inferior vena cava is normal in size with greater than 50% respiratory variability, suggesting right atrial pressure of 3 mmHg. FINDINGS  Left Ventricle: Left ventricular ejection fraction, by estimation, is 50%. The left ventricle has mildly decreased function. The left ventricle demonstrates global hypokinesis. The left ventricular internal cavity size was normal in size. There is no left ventricular hypertrophy. Left ventricular diastolic parameters are consistent with Grade I diastolic dysfunction (impaired relaxation). Right Ventricle: The right ventricular size is normal. No increase in right ventricular wall thickness. Right ventricular systolic function is normal. There is normal pulmonary artery systolic pressure. The tricuspid regurgitant velocity is 2.21 m/s, and  with an assumed right atrial pressure of 3 mmHg, the estimated right ventricular systolic pressure is 22.5 mmHg. Left Atrium: Left atrial size was mildly dilated. Right Atrium: Right  atrial size was mildly dilated. Pericardium: Trivial pericardial effusion is present. Mitral Valve: The mitral valve is normal in structure. Mild mitral annular calcification. Mild mitral valve regurgitation. No evidence of mitral valve stenosis. Tricuspid Valve: The tricuspid valve is normal in structure. Tricuspid valve regurgitation is trivial. Aortic Valve: The aortic valve is tricuspid. There is mild calcification of the aortic valve. Aortic valve regurgitation is trivial. No aortic stenosis is present. Aortic valve mean gradient measures 3.0 mmHg. Aortic valve peak gradient measures 6.2 mmHg. Aortic valve area, by VTI measures 1.95 cm. Pulmonic Valve: The pulmonic valve was normal in structure. Pulmonic valve regurgitation is mild. Aorta: The aortic root is normal in size and structure. Venous: The inferior vena cava is normal in size with greater than 50% respiratory variability, suggesting right atrial pressure of 3 mmHg. IAS/Shunts: No atrial level shunt detected by color flow Doppler.  LEFT VENTRICLE PLAX 2D LVIDd:         3.90 cm   Diastology LVIDs:         2.30 cm   LV e' medial:    7.83 cm/s LV PW:         1.00 cm   LV E/e' medial:  8.1 LV IVS:        0.90 cm   LV e' lateral:   11.30 cm/s LVOT diam:     1.70 cm   LV E/e' lateral: 5.6 LV SV:         40 LV SV Index:   26 LVOT Area:     2.27 cm  RIGHT VENTRICLE             IVC RV Basal diam:  4.25 cm     IVC diam: 1.10 cm RV Mid diam:    2.90 cm RV S prime:     10.70 cm/s TAPSE (M-mode): 1.3 cm LEFT ATRIUM             Index        RIGHT ATRIUM           Index LA diam:        3.20 cm 2.07 cm/m   RA Area:     16.50 cm LA Vol (A2C):   47.3 ml 30.63 ml/m  RA Volume:   45.10 ml  29.20 ml/m LA Vol (A4C):   53.6 ml 34.71 ml/m LA Biplane  Vol: 52.4 ml 33.93 ml/m  AORTIC VALVE                    PULMONIC VALVE AV Area (Vmax):    1.90 cm     PV Vmax:          0.84 m/s AV Area (Vmean):   1.84 cm     PV Peak grad:     2.8 mmHg AV Area (VTI):     1.95 cm      PR End Diast Vel: 4.33 msec AV Vmax:           124.00 cm/s AV Vmean:          83.400 cm/s AV VTI:            0.204 m AV Peak Grad:      6.2 mmHg AV Mean Grad:      3.0 mmHg LVOT Vmax:         104.00 cm/s LVOT Vmean:        67.600 cm/s LVOT VTI:          0.175 m LVOT/AV VTI ratio: 0.86 AR Vena Contracta: 0.20 cm  AORTA Ao Root diam: 3.00 cm Ao Asc diam:  3.10 cm MITRAL VALVE               TRICUSPID VALVE MV Area (PHT): 3.99 cm    TR Peak grad:   19.5 mmHg MV Decel Time: 190 msec    TR Vmax:        221.00 cm/s MV E velocity: 63.40 cm/s MV A velocity: 69.70 cm/s  SHUNTS MV E/A ratio:  0.91        Systemic VTI:  0.18 m                            Systemic Diam: 1.70 cm Dalton McleanMD Electronically signed by Wilfred Lacy Signature Date/Time: 03/31/2023/5:44:29 PM    Final    DG CHEST PORT 1 VIEW  Result Date: 03/31/2023 CLINICAL DATA:  Chest pain EXAM: PORTABLE CHEST - 1 VIEW COMPARISON:  03/24/2023 FINDINGS: Cardiomediastinal silhouette and pulmonary vasculature are within normal limits. Lungs are hyperexpanded but otherwise clear. IMPRESSION: No acute cardiopulmonary process. Electronically Signed   By: Acquanetta Belling M.D.   On: 03/31/2023 14:26    Labs: BMET Recent Labs  Lab 03/26/23 0114 03/27/23 0127 03/28/23 0152 03/29/23 0354 03/29/23 1757 03/31/23 0308 03/31/23 1647 04/01/23 0144  NA 126* 121* 126* 121* 124* 121* 121* 121*  K 3.7 3.5 4.2 4.2  --  4.0 4.5 4.0  CL 91* 86* 86* 87*  --  89* 87* 87*  CO2 24 27 30 26   --  25 25 24   GLUCOSE 111* 112* 116* 109*  --  118* 101* 145*  BUN 7* 9 12 12   --  10 37* 51*  CREATININE 0.51 0.49 0.45 0.38*  --  0.37* 0.48 0.48  CALCIUM 7.8* 7.7* 8.4* 8.0*  --  8.0* 8.6* 8.8*   CBC Recent Labs  Lab 03/26/23 0114 03/27/23 0127 03/28/23 0152 04/01/23 0144  WBC 8.0 8.2 7.5 9.2  HGB 8.1* 7.9* 8.4* 7.9*  HCT 23.1* 22.2* 23.9* 22.7*  MCV 92.4 91.0 92.6 93.8  PLT 246 268 309 330    Medications:     apixaban  2.5 mg Oral BID   calcium carbonate   1 tablet Oral Q breakfast   cholecalciferol  1,000 Units Oral Daily  docusate sodium  100 mg Oral BID   metoprolol tartrate  50 mg Oral BID   pantoprazole  40 mg Oral Daily   pravastatin  40 mg Oral q1800   sodium chloride flush  3 mL Intravenous Q12H   sodium chloride  1 g Oral TID WC   urea  30 g Oral BID    Bufford Buttner MD 04/01/2023, 10:53 AM

## 2023-04-01 NOTE — Progress Notes (Signed)
Physical Therapy Treatment Patient Details Name: CYLIE DENYS MRN: [id number] DOB: [birth year] Today's Date: 04/01/2023   History of Present Illness Pt is an 82 y.o. female who presented 03/24/23 s/p fall, sustaining an acute displaced fracture of the right femoral diaphysis. S/p IM nailing of R femoral shaft fx 7/22. PMH: GERD, osteoporosis, HLD    PT Comments  Pt supine in bed on arrival this session.  She reports not feeling best but agreeable to supine there ex and standing trial.  Pt continues to be limited secondary to medical complications this time due to elevated HR at 125 bpm just moving into standing.  She does report feeling mildly dizzy but this corrects with return back to supine.      If plan is discharge home, recommend the following: A lot of help with walking and/or transfers;A lot of help with bathing/dressing/bathroom;Assistance with cooking/housework;Assist for transportation;Help with stairs or ramp for entrance   Can travel by private vehicle     No  Equipment Recommendations  Rolling walker (2 wheels);BSC/3in1    Recommendations for Other Services       Precautions / Restrictions Precautions Precautions: Fall Precaution Comments: watch BP, Watch HR Restrictions Weight Bearing Restrictions: Yes RLE Weight Bearing: Weight bearing as tolerated     Mobility  Bed Mobility Overal bed mobility: Needs Assistance Bed Mobility: Supine to Sit     Supine to sit: Supervision Sit to supine: Min assist   General bed mobility comments: Supervision with increased time to move to edge of bed. Min assistance to lift B LEs back into bed.    Transfers Overall transfer level: Needs assistance Equipment used: Rolling walker (2 wheels) Transfers: Sit to/from Stand Sit to Stand: Min guard           General transfer comment: Performed sit to stand HR elevated to 125 bpm so deferred gt training at this time.  Pt endorses not feeling well.    Ambulation/Gait                    Stairs             Wheelchair Mobility     Tilt Bed    Modified Rankin (Stroke Patients Only)       Balance                                            Cognition Arousal/Alertness: Awake/alert Behavior During Therapy: WFL for tasks assessed/performed Overall Cognitive Status: Within Functional Limits for tasks assessed                                          Exercises General Exercises - Lower Extremity Ankle Circles/Pumps: AROM, Both, 10 reps, Supine Quad Sets: AROM, Both, 10 reps, Supine Heel Slides: AAROM, Right, 10 reps, Supine Hip ABduction/ADduction: Right, 10 reps, Supine, AROM Straight Leg Raises: AROM, Right, 10 reps, Supine    General Comments        Pertinent Vitals/Pain Pain Assessment Pain Assessment: Faces Faces Pain Scale: Hurts little more Pain Location: R hip Pain Descriptors / Indicators: Discomfort, Grimacing, Operative site guarding Pain Intervention(s): Monitored during session, Repositioned    Home Living  Prior Function            PT Goals (current goals can now be found in the care plan section) Acute Rehab PT Goals Patient Stated Goal: to improve Potential to Achieve Goals: Good Progress towards PT goals: Progressing toward goals    Frequency    Min 1X/week      PT Plan Current plan remains appropriate    Co-evaluation              AM-PAC PT "6 Clicks" Mobility   Outcome Measure  Help needed turning from your back to your side while in a flat bed without using bedrails?: A Little Help needed moving from lying on your back to sitting on the side of a flat bed without using bedrails?: A Little Help needed moving to and from a bed to a chair (including a wheelchair)?: A Little Help needed standing up from a chair using your arms (e.g., wheelchair or bedside chair)?: A Little Help needed to walk in hospital room?: A  Little Help needed climbing 3-5 steps with a railing? : A Little 6 Click Score: 18    End of Session Equipment Utilized During Treatment: Gait belt Activity Tolerance: Treatment limited secondary to medical complications (Comment) (elevated HR this session.) Patient left: in bed;with call bell/phone within reach;with bed alarm set Nurse Communication: Other (comment) PT Visit Diagnosis: Unsteadiness on feet (R26.81);Other abnormalities of gait and mobility (R26.89);Muscle weakness (generalized) (M62.81);History of falling (Z91.81);Difficulty in walking, not elsewhere classified (R26.2);Pain Pain - Right/Left: Right Pain - part of body: Hip     Time: 1203-1216 PT Time Calculation (min) (ACUTE ONLY): 13 min  Charges:    $Therapeutic Exercise: 8-22 mins PT General Charges $$ ACUTE PT VISIT: 1 Visit                     [name] , PTA Acute Rehabilitation Services Office [phone]    Florestine Avers 04/01/2023, 12:24 PM

## 2023-04-01 NOTE — TOC Progression Note (Signed)
Transition of Care [facility]) - Progression Note    Patient Details  Name: [name] MRN: [id number] Date of Birth: October 16, 1940  Transition of [facility] CM/SW Contact  Lorri Frederick, LCSW Phone Number: 04/01/2023, 10:26 AM  Clinical Narrative:   CSW informed Tammy/Peak that pt not medically ready.  She reports [facility] will be good as long as pt is admitted by 8/8.    Expected Discharge Plan: Skilled Nursing Facility Barriers to Discharge: English as a second language teacher, Continued Medical Work up  Expected Discharge Plan and Services                                               Social Determinants of Health (SDOH) Interventions SDOH Screenings   Food Insecurity: No Food Insecurity (03/24/2023)  Housing: Low Risk  (03/24/2023)  Transportation Needs: No Transportation Needs (03/24/2023)  Utilities: Not At Risk (03/24/2023)  Financial Resource Strain: Low Risk  (02/17/2023)   Received from [facility], [facility]  Tobacco Use: Low Risk  (03/24/2023)    Readmission Risk Interventions     No data to display

## 2023-04-01 NOTE — Plan of Care (Signed)
  Problem: Education: Goal: Knowledge of General Education information will improve Description: Including pain rating scale, medication(s)/side effects and non-pharmacologic comfort measures 04/01/2023 0548 by Debbrah Alar, RN Outcome: Not Progressing 04/01/2023 0547 by Debbrah Alar, RN Outcome: Not Progressing   Problem: Health Behavior/Discharge Planning: Goal: Ability to manage health-related needs will improve 04/01/2023 0548 by Debbrah Alar, RN Outcome: Not Progressing 04/01/2023 0547 by Debbrah Alar, RN Outcome: Not Progressing   Problem: Clinical Measurements: Goal: Ability to maintain clinical measurements within normal limits will improve 04/01/2023 0548 by Debbrah Alar, RN Outcome: Not Progressing 04/01/2023 0547 by Debbrah Alar, RN Outcome: Not Progressing Goal: Will remain free from infection 04/01/2023 0548 by Debbrah Alar, RN Outcome: Not Progressing 04/01/2023 0547 by Debbrah Alar, RN Outcome: Not Progressing Goal: Diagnostic test results will improve 04/01/2023 0548 by Debbrah Alar, RN Outcome: Not Progressing 04/01/2023 0547 by Debbrah Alar, RN Outcome: Not Progressing Goal: Respiratory complications will improve 04/01/2023 0548 by Debbrah Alar, RN Outcome: Not Progressing 04/01/2023 0547 by Debbrah Alar, RN Outcome: Not Progressing Goal: Cardiovascular complication will be avoided 04/01/2023 0548 by Debbrah Alar, RN Outcome: Not Progressing 04/01/2023 0547 by Debbrah Alar, RN Outcome: Not Progressing   Problem: Activity: Goal: Risk for activity intolerance will decrease 04/01/2023 0548 by Debbrah Alar, RN Outcome: Not Progressing 04/01/2023 0547 by Debbrah Alar, RN Outcome: Not Progressing   Problem: Nutrition: Goal: Adequate nutrition will be maintained 04/01/2023 0548 by Debbrah Alar, RN Outcome: Not Progressing 04/01/2023 0547 by Debbrah Alar,  RN Outcome: Not Progressing   Problem: Coping: Goal: Level of anxiety will decrease 04/01/2023 0548 by Debbrah Alar, RN Outcome: Not Progressing 04/01/2023 0547 by Debbrah Alar, RN Outcome: Not Progressing   Problem: Elimination: Goal: Will not experience complications related to bowel motility 04/01/2023 0548 by Debbrah Alar, RN Outcome: Not Progressing 04/01/2023 0547 by Debbrah Alar, RN Outcome: Not Progressing Goal: Will not experience complications related to urinary retention 04/01/2023 0548 by Debbrah Alar, RN Outcome: Not Progressing 04/01/2023 0547 by Debbrah Alar, RN Outcome: Not Progressing   Problem: Pain Managment: Goal: General experience of comfort will improve 04/01/2023 0548 by Debbrah Alar, RN Outcome: Not Progressing 04/01/2023 0547 by Debbrah Alar, RN Outcome: Not Progressing   Problem: Safety: Goal: Ability to remain free from injury will improve 04/01/2023 0548 by Debbrah Alar, RN Outcome: Not Progressing 04/01/2023 0547 by Debbrah Alar, RN Outcome: Not Progressing   Problem: Skin Integrity: Goal: Risk for impaired skin integrity will decrease 04/01/2023 0548 by Debbrah Alar, RN Outcome: Not Progressing 04/01/2023 0547 by Debbrah Alar, RN Outcome: Not Progressing

## 2023-04-02 ENCOUNTER — Other Ambulatory Visit (HOSPITAL_COMMUNITY): Payer: Self-pay

## 2023-04-02 DIAGNOSIS — S72301A Unspecified fracture of shaft of right femur, initial encounter for closed fracture: Secondary | ICD-10-CM | POA: Diagnosis not present

## 2023-04-02 DIAGNOSIS — E785 Hyperlipidemia, unspecified: Secondary | ICD-10-CM | POA: Diagnosis not present

## 2023-04-02 DIAGNOSIS — E871 Hypo-osmolality and hyponatremia: Secondary | ICD-10-CM | POA: Diagnosis not present

## 2023-04-02 DIAGNOSIS — I480 Paroxysmal atrial fibrillation: Secondary | ICD-10-CM | POA: Diagnosis not present

## 2023-04-02 LAB — TYPE AND SCREEN
ABO/RH(D): A POS
Antibody Screen: NEGATIVE

## 2023-04-02 LAB — RENAL FUNCTION PANEL
Albumin: 2.5 g/dL — ABNORMAL LOW (ref 3.5–5.0)
Anion gap: 13 (ref 5–15)
BUN: 20 mg/dL (ref 8–23)
CO2: 22 mmol/L (ref 22–32)
Calcium: 8.8 mg/dL — ABNORMAL LOW (ref 8.9–10.3)
Chloride: 89 mmol/L — ABNORMAL LOW (ref 98–111)
Creatinine, Ser: 0.58 mg/dL (ref 0.44–1.00)
GFR, Estimated: >60 mL/min (ref >60)
Glucose, Bld: 142 mg/dL — ABNORMAL HIGH (ref 70–99)
Phosphorus: 3.7 mg/dL (ref 2.5–4.6)
Potassium: 3.9 mmol/L (ref 3.5–5.1)
Sodium: 124 mmol/L — ABNORMAL LOW (ref 135–145)

## 2023-04-02 LAB — BPAM RBC
Blood Product Expiration Date: 202408042359
ISSUE DATE / TIME: 202407311145
Unit Type and Rh: 6200

## 2023-04-02 LAB — PREPARE RBC (CROSSMATCH)

## 2023-04-02 MED ORDER — MIDODRINE HCL 5 MG PO TABS
10.0000 mg | ORAL_TABLET | Freq: Three times a day (TID) | ORAL | Status: DC
  Administered 2023-04-02 – 2023-04-03 (×4): 10 mg via ORAL
  Filled 2023-04-02 (×4): qty 2

## 2023-04-02 MED ORDER — SODIUM CHLORIDE 0.9 % IV BOLUS
250.0000 mL | Freq: Once | INTRAVENOUS | Status: AC
  Administered 2023-04-02: 250 mL via INTRAVENOUS

## 2023-04-02 MED ORDER — SODIUM CHLORIDE 0.9% IV SOLUTION: Freq: Once | INTRAVENOUS | Status: AC

## 2023-04-02 MED ORDER — METOPROLOL TARTRATE 50 MG PO TABS
100.0000 mg | ORAL_TABLET | Freq: Two times a day (BID) | ORAL | Status: DC
  Administered 2023-04-02 – 2023-04-04 (×4): 100 mg via ORAL
  Filled 2023-04-02 (×4): qty 2

## 2023-04-02 NOTE — Plan of Care (Signed)
  Problem: Clinical Measurements: Goal: Will remain free from infection Outcome: Progressing   Problem: Elimination: Goal: Will not experience complications related to bowel motility Outcome: Progressing Goal: Will not experience complications related to urinary retention Outcome: Progressing   Problem: Pain Managment: Goal: General experience of comfort will improve Outcome: Progressing   

## 2023-04-02 NOTE — Progress Notes (Signed)
Progress Note   Patient: [name] ZOX:[id number] DOB: 1941-05-29 DOA: 03/24/2023     9 DOS: the patient was seen and examined on 04/02/2023 at 8:21AM      Brief hospital course: Mrs. Vicioso is an 82 y.o. F with HTN, HLD who presented after a mechanical fall and was found to have R midshaft femoral fracture.    S/p IM nail on 7/22.  Hospital stay complicated by hyponatremia, recalcitrant, and new onset A-fib.        Assessment and Plan: * Right femoral shaft fracture (HCC) S/p reduction and internal fixation 03/24/23 by Dr. [name] - WBAT and ROM as tolerated - Continue vit D and calcium at d/c - Should NOT have aspirin for DVT ppx at d/c (will be on new Eliquis)    Hyponatremia due to SIADH Sodium 128 on admission, worsened post-op.  Now stubbornly hyponatremic (prior baseline ~137-139 last Dec, Jan, just prior to admission 130 on 7/19)  Cortisol normal.  Una and Uosm suggest inappropriate urine concentration.  Nephrology have attempted water excretion with Na and Urea to no avail.  Tried Lasix which caused some hypotension.  Na only up to 123 - Defer to Nephrology expertise     Paroxysmal atrial fibrillation (HCC) New onset post-op.  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  TSH normal.   Echo with mildly reduced EF.  Mag close to normal  - Add midodrine for BP support to titrate metoprolol - Plan to stop midodrine in outpatient setting - Increase metoprolol  - Continue new Eliquis - Will need follow up with Cardiology after dicharge     Vitamin D deficiency - Supplement vit D, Ca  Normocytic anemia Hgb stable in 7s.  Given cardiac condition, will transfuse. - Transfuse 1u PRBCs now   Essential hypertension BP soft overnight - Continue metoprolol  Dyslipidemia - Continue statin          Subjective: Patient feels weak, tired, generally appearing.  No clinical bleeding.  No respiratory symptoms.  No leg  swelling.  No nursing concerns.     Physical Exam: BP (!) 114/57   Pulse 95   Temp 98.5 F (36.9 C) (Oral)   Resp 17   Ht 5\' 4"  (1.626 m)   Wt 53.8 kg   SpO2 97%   BMI 20.36 kg/m   Frail elderly female, lying in bed, appears weak and tired Tachycardic, irregular, no murmurs, no peripheral edema, no JVD Respiratory rate normal, lungs clear without rales or wheezes Abdomen soft no tenderness palpation or guarding Attention normal, affect sad and anxious, moves upper extremities with generalized weakness but symmetric strength, face symmetric, speech fluent, oriented to person, place, and time     Data Reviewed: Discussed with nephrology Basic metabolic panel shows sodium up to 123, creatinine normal CBC shows hemoglobin 7.8  Family Communication: None present    Disposition: Status is: Inpatient The patient was admitted with hip fracture.  Postop course has been complicated by hyponatremia and new onset atrial fibrillation.  From the setting of her A-fib, her rate is still somewhat elevated, so we will titrate up metoprolol.  Sodium is still not reached a point where nephrology feel she is safe for discharge.  If nephrology sign off on hyponatremia with outpatient plan, and her heart rate is controlled less than 105, stable for discharge tomorrow or Friday        Author: Alberteen Sam, MD 04/02/2023 3:34 PM  For on call review  [email or web address]

## 2023-04-02 NOTE — Progress Notes (Signed)
Occupational Therapy Treatment Patient Details Name: [name] MRN: [id number] DOB: [birth year] Today's Date: 04/02/2023   History of present illness Pt is an 82 y.o. female who presented 03/24/23 s/p fall, sustaining an acute displaced fracture of the right femoral diaphysis. S/p IM nailing of R femoral shaft fx 7/22. PMH: GERD, osteoporosis, HLD   OT comments  Pt with good progress toward established OT goals. Pt HR max of 126 this sessio and demonstrating greater cardiovascular endurance and stable vital signs. Pt performing LB Adl with AE with min A and use of AE for RLE due to inability to reach distal foot to don socks. Ambulatory to restroom with RW; challenging balance with manipulation of underpants before and after sititng on potty chair and to wash hands without UE support in standing this session. Pt and family remain interested in inpatient rehabilitation <3 hours per day after discharge to optimize safety and independence in ADL and IADL.    Recommendations for follow up therapy are one component of a multi-disciplinary discharge planning process, led by the attending physician.  Recommendations may be updated based on patient status, additional functional criteria and insurance authorization.    Assistance Recommended at Discharge Frequent or constant Supervision/Assistance  Patient can return home with the following  Help with stairs or ramp for entrance;Assist for transportation;Assistance with cooking/housework;A lot of help with walking and/or transfers;A lot of help with bathing/dressing/bathroom   Equipment Recommendations  None recommended by OT    Recommendations for Other Services      Precautions / Restrictions Precautions Precautions: Fall Precaution Comments: watch BP, Watch HR Restrictions Weight Bearing Restrictions: Yes RLE Weight Bearing: Weight bearing as tolerated       Mobility Bed Mobility               General bed mobility comments: up  in recliner on arrival and departure    Transfers Overall transfer level: Needs assistance Equipment used: Rolling walker (2 wheels) Transfers: Sit to/from Stand Sit to Stand: Supervision           General transfer comment: Min cues for hand placement initially, but greater ability perform without cues with multiple attempts during session     Balance Overall balance assessment: Needs assistance Sitting-balance support: Feet supported, Bilateral upper extremity supported Sitting balance-Leahy Scale: Good     Standing balance support: Bilateral upper extremity supported, Reliant on assistive device for balance, During functional activity Standing balance-Leahy Scale: Fair Standing balance comment: reliant on RW for support dynamically. Statically, able to perform ADL near BOS (wash hands, etc)                           ADL either performed or assessed with clinical judgement   ADL Overall ADL's : Needs assistance/impaired     Grooming: Wash/dry hands;Supervision/safety;Sitting Grooming Details (indicate cue type and reason): at sink; also brushed hair in recliner             Lower Body Dressing: Minimal assistance;With adaptive equipment;Sitting/lateral leans Lower Body Dressing Details (indicate cue type and reason): min A to doff R sock; intro sock aid to don due to inability to thread and able to don with set-up A Toilet Transfer: Supervision/safety;Rolling walker (2 wheels);Ambulation;BSC/3in1 Statistician Details (indicate cue type and reason): supervision for safety to [facility] over toilet Toileting- Clothing Manipulation and Hygiene: Supervision/safety;Sitting/lateral lean Toileting - Clothing Manipulation Details (indicate cue type and reason): able to manage mesh underpants and wipe  self     Functional mobility during ADLs: Supervision/safety;Rolling walker (2 wheels) General ADL Comments: Education regarding edema management as pt with edema in BLE.  BLE elevated on pillow in recliner chair at end of session and cutting slit in socks to reduce pressure    Extremity/Trunk Assessment Upper Extremity Assessment Upper Extremity Assessment: Generalized weakness   Lower Extremity Assessment Lower Extremity Assessment: Defer to PT evaluation        Vision       Perception Perception Perception: Within Functional Limits   Praxis Praxis Praxis: Intact    Cognition Arousal/Alertness: Awake/alert Behavior During Therapy: WFL for tasks assessed/performed Overall Cognitive Status: Within Functional Limits for tasks assessed                                 General Comments: slowed proocessing, but overall WFL for tasks assessed        Exercises      Shoulder Instructions       General Comments HR max of 126 after going to restroom and washing hands while walking to chair. 95 on arrival and 96 on departure.    Pertinent Vitals/ Pain       Pain Assessment Pain Assessment: Faces Faces Pain Scale: Hurts little more Pain Location: R femur/hip Pain Descriptors / Indicators: Discomfort, Grimacing, Operative site guarding Pain Intervention(s): Repositioned  Home Living                                          Prior Functioning/Environment              Frequency  Min 1X/week        Progress Toward Goals  OT Goals(current goals can now be found in the care plan section)  Progress towards OT goals: Progressing toward goals  Acute Rehab OT Goals Patient Stated Goal: get better OT Goal Formulation: With patient Time For Goal Achievement: 04/08/23 Potential to Achieve Goals: Good ADL Goals Pt Will Perform Grooming: with supervision;standing Pt Will Perform Lower Body Dressing: with supervision;sit to/from stand;with adaptive equipment Pt Will Transfer to Toilet: with supervision;ambulating;regular height toilet  Plan Discharge plan remains appropriate    Co-evaluation                  AM-PAC OT "6 Clicks" Daily Activity     Outcome Measure   Help from another person eating meals?: None Help from another person taking care of personal grooming?: A Little Help from another person toileting, which includes using toliet, bedpan, or urinal?: A Lot Help from another person bathing (including washing, rinsing, drying)?: A Lot Help from another person to put on and taking off regular upper body clothing?: A Little Help from another person to put on and taking off regular lower body clothing?: A Lot 6 Click Score: 16    End of Session Equipment Utilized During Treatment: Gait belt;Rolling walker (2 wheels)  OT Visit Diagnosis: Unsteadiness on feet (R26.81);Pain Pain - Right/Left: Right Pain - part of body: Hip;Leg   Activity Tolerance Patient tolerated treatment well   Patient Left in chair;with call bell/phone within reach;with chair alarm set;with family/visitor present   Nurse Communication Mobility status        Time: 8546-2703 OT Time Calculation (min): 25 min  Charges: OT General Charges $OT Visit: 1 Visit OT  Treatments $Self Care/Home Management : 23-37 mins  Tyler Deis, OTR/L [facility] Acute Rehabilitation Office: [phone]   Myrla Halsted 04/02/2023, 5:07 PM

## 2023-04-02 NOTE — Progress Notes (Signed)
Orthopaedic Trauma Progress Note  SUBJECTIVE: Doing ok this morning, pain fairly well controlled on current regimen.  Was able to work with therapies yesterday but sessions were cut short due to nausea.  Denies any vomiting.  Zofran helping some.  His denies any significant numbness or tingling throughout the right lower extremity.  No chest pain. No SOB. No other complaints.  Patient agreeable to SNF.  Patient remains hyponatremic.  Medicine team continues to work on limiting fluids and supplementing with salt tablets and Urea  OBJECTIVE:  Vitals:   04/02/23 0438 04/02/23 0808  BP: (!) 144/77 120/82  Pulse: (!) 108   Resp: 15 18  Temp: 97.9 F (36.6 C) 98.2 F (36.8 C)  SpO2: 97% 98%    General: Sitting up in bed, no acute distress.  Pleasant and cooperative.  Alert and oriented x 3 Respiratory: No increased work of breathing.  RLE: Incisions are clean, dry, intact.  Minimally tender over the lateral hip and thigh.  No significant tenderness about the knee or throughout the lower leg.  No calf tenderness.  Ankle dorsiflexion/plantarflexion is intact.  Able to wiggle toes.  Endorses sensation distally.  Compartment soft compressible.  Skin warm and dry. + DP pulse  IMAGING: Stable post op imaging.   LABS:  Results for orders placed or performed during the hospital encounter of 03/24/23 (from the past 24 hour(s))  Renal function panel     Status: Abnormal   Collection Time: 04/01/23  2:04 PM  Result Value Ref Range   Sodium 123 (L) 135 - 145 mmol/L   Potassium 4.1 3.5 - 5.1 mmol/L   Chloride 89 (L) 98 - 111 mmol/L   CO2 26 22 - 32 mmol/L   Glucose, Bld 105 (H) 70 - 99 mg/dL   BUN 32 (H) 8 - 23 mg/dL   Creatinine, Ser 4.01 (L) 0.44 - 1.00 mg/dL   Calcium 8.8 (L) 8.9 - 10.3 mg/dL   Phosphorus 2.8 2.5 - 4.6 mg/dL   Albumin 2.4 (L) 3.5 - 5.0 g/dL   GFR, Estimated >02 >72 mL/min   Anion gap 8 5 - 15  CBC     Status: Abnormal   Collection Time: 04/02/23  1:00 AM  Result Value Ref  Range   WBC 6.3 4.0 - 10.5 K/uL   RBC 2.48 (L) 3.87 - 5.11 MIL/uL   Hemoglobin 7.8 (L) 12.0 - 15.0 g/dL   HCT 53.6 (L) 64.4 - 03.4 %   MCV 91.5 80.0 - 100.0 fL   MCH 31.5 26.0 - 34.0 pg   MCHC 34.4 30.0 - 36.0 g/dL   RDW 74.2 59.5 - 63.8 %   Platelets 369 150 - 400 K/uL   nRBC 0.0 0.0 - 0.2 %  Basic metabolic panel     Status: Abnormal   Collection Time: 04/02/23  1:00 AM  Result Value Ref Range   Sodium 123 (L) 135 - 145 mmol/L   Potassium 3.5 3.5 - 5.1 mmol/L   Chloride 88 (L) 98 - 111 mmol/L   CO2 26 22 - 32 mmol/L   Glucose, Bld 119 (H) 70 - 99 mg/dL   BUN 47 (H) 8 - 23 mg/dL   Creatinine, Ser 7.56 (L) 0.44 - 1.00 mg/dL   Calcium 8.8 (L) 8.9 - 10.3 mg/dL   GFR, Estimated >43 >32 mL/min   Anion gap 9 5 - 15  Prepare RBC (crossmatch)     Status: None   Collection Time: 04/02/23  8:42 AM  Result  Value Ref Range   Order Confirmation      ORDER PROCESSED BY BLOOD BANK Performed at [facility], [address] [place], [place] [address]   Type and screen [facility]     Status: None   Collection Time: 04/02/23  8:54 AM  Result Value Ref Range   ABO/RH(D) A POS    Antibody Screen NEG    Sample Expiration      04/05/2023,2359 Performed at [facility], [address] [place], [place] [address]     ASSESSMENT: [name] is a 82 y.o. female, 9 Days Post-Op s/p INTRAMEDULLARY NAIL RIGHT FEMORAL SHAFT FRACTURE   CV/Blood loss: Acute blood loss anemia.  Hemoglobin 7.8 this morning.  1 unit PRBCs ordered for transfusion.  PLAN: Weightbearing: WBAT RLE ROM:  Ok for hip and knee motion as tolerated  Incisional and dressing care: Okay to leave incisions open to air  Showering: Ok to get incisions wet Orthopedic device(s): None  Pain management: Continue current multimodal pain control VTE prophylaxis: Lovenox, SCDs ID:  Ancef 2gm post op completed Foley/Lines:  No foley, KVO IVFs Impediments to Fracture Healing: Vit D level 27, started  on supplementation. Dispo: PT/OT evaluation ongoing, currently recommending SNF.  Patient agreeable to this.  [facility]  Okay for discharge from ortho standpoint.  I have signed and placed discharge Rx for pain medication, muscle relaxer, vitamin D, DVT prophylaxis in patient's chart  D/C recommendations: - Hydrocodone and Robaxin for pain control - Aspirin 325 mg daily x 30 days for DVT prophylaxis - Continue 2000 units Vit D supplementation daily  Follow - up plan: 2 weeks after discharge for wound check and repeat x-rays   Contact information:  Truitt Merle MD, Thyra Breed PA-C. After hours and holidays please check [email or web address] for group call information for [facility]   Thompson Caul, PA-C [phone] (office) [email or web address]

## 2023-04-02 NOTE — Progress Notes (Signed)
Physical Therapy Treatment Patient Details Name: [name] MRN: [id number] DOB: 1941-05-21 Today's Date: 04/02/2023   History of Present Illness Pt is an 82 y.o. female who presented 03/24/23 s/p fall, sustaining an acute displaced fracture of the right femoral diaphysis. S/p IM nailing of R femoral shaft fx 7/22. PMH: GERD, osteoporosis, HLD    PT Comments  Pt supine in bed on arrival.  She appears more alert and less fatigued on arrival.  Pt able to progress to gt training this session.  No complaints of dizziness.  HR ranged from 98-118bpm during session with one instance elevating 126 bpm after leaving bathroom.  Pt BP ranged stable in supine 1037/70 (79), sitting 111/66 (80), and 102/71(80).  Will continues to recommend rehab in post acute setting until strength and function returns.      If plan is discharge home, recommend the following: A lot of help with walking and/or transfers;A lot of help with bathing/dressing/bathroom;Assistance with cooking/housework;Assist for transportation;Help with stairs or ramp for entrance   Can travel by private vehicle     No  Equipment Recommendations  Rolling walker (2 wheels);BSC/3in1    Recommendations for Other Services       Precautions / Restrictions Precautions Precautions: Fall Precaution Comments: watch BP, Watch HR Restrictions Weight Bearing Restrictions: Yes RLE Weight Bearing: Weight bearing as tolerated     Mobility  Bed Mobility Overal bed mobility: Needs Assistance Bed Mobility: Supine to Sit     Supine to sit: Supervision     General bed mobility comments: Supervision with increased time to move to edge of bed.    Transfers Overall transfer level: Needs assistance Equipment used: Rolling walker (2 wheels) Transfers: Sit to/from Stand Sit to Stand: Supervision           General transfer comment: Cues for hand placement to and from seated surface.    Ambulation/Gait Ambulation/Gait assistance: Min  guard Gait Distance (Feet): 35 Feet (+ 10 ft) Assistive device: Rolling walker (2 wheels) Gait Pattern/deviations: Step-to pattern, Narrow base of support, Trunk flexed       General Gait Details: No dizziness. HR ranged from 98-118 bpm during gt training.  x seated rest break for BM.  During ambulation back from bathroom HR elevated 126 bpm.  No symptoms present.  Cues for posture, sequencing, turns and backing.   Stairs             Wheelchair Mobility     Tilt Bed    Modified Rankin (Stroke Patients Only)       Balance Overall balance assessment: Needs assistance Sitting-balance support: Feet supported, Bilateral upper extremity supported Sitting balance-Leahy Scale: Good Sitting balance - Comments: min guard     Standing balance-Leahy Scale: Fair Standing balance comment: reliant on RW for support                            Cognition Arousal/Alertness: Awake/alert Behavior During Therapy: WFL for tasks assessed/performed Overall Cognitive Status: Within Functional Limits for tasks assessed                                          Exercises General Exercises - Lower Extremity Quad Sets: Both, 10 reps, Supine    General Comments        Pertinent Vitals/Pain Pain Assessment Pain Assessment: Faces Faces Pain Scale: Hurts  little more Pain Location: R hip Pain Descriptors / Indicators: Discomfort, Grimacing, Operative site guarding Pain Intervention(s): Repositioned    Home Living                          Prior Function            PT Goals (current goals can now be found in the care plan section) Acute Rehab PT Goals Patient Stated Goal: to improve Potential to Achieve Goals: Good Progress towards PT goals: Progressing toward goals    Frequency    Min 1X/week      PT Plan Current plan remains appropriate    Co-evaluation              AM-PAC PT "6 Clicks" Mobility   Outcome Measure  Help  needed turning from your back to your side while in a flat bed without using bedrails?: A Little Help needed moving from lying on your back to sitting on the side of a flat bed without using bedrails?: A Little Help needed moving to and from a bed to a chair (including a wheelchair)?: A Little Help needed standing up from a chair using your arms (e.g., wheelchair or bedside chair)?: A Little Help needed to walk in hospital room?: A Little Help needed climbing 3-5 steps with a railing? : A Little 6 Click Score: 18    End of Session Equipment Utilized During Treatment: Gait belt Activity Tolerance: Patient tolerated treatment well Patient left: with call bell/phone within reach;in chair;with chair alarm set;with family/visitor present Nurse Communication: Other (comment) (Pt had BM.) PT Visit Diagnosis: Unsteadiness on feet (R26.81);Other abnormalities of gait and mobility (R26.89);Muscle weakness (generalized) (M62.81);History of falling (Z91.81);Difficulty in walking, not elsewhere classified (R26.2);Pain Pain - Right/Left: Right Pain - part of body: Hip     Time: 4034-7425 PT Time Calculation (min) (ACUTE ONLY): 42 min  Charges:    $Gait Training: 8-22 mins $Therapeutic Activity: 23-37 mins PT General Charges $$ ACUTE PT VISIT: 1 Visit                     [name] , PTA Acute Rehabilitation Services Office [phone]    [name] 04/02/2023, 3:31 PM

## 2023-04-02 NOTE — Progress Notes (Signed)
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  R hip fracture: s/p IM nail   3.  HTN:             - off antihypertensives, BP low  - add midodrine 10 TID  - also will get 1 u pRBCs today  4.  New onset Afib:  - TTE 50% EF  - dilated LA  - on metop and Eliquis   5.  Dispo: pending  Subjective:    Had Afib last night.  Had fluid bolus.  Na improved to 123.  BP on low side.     Objective:   BP 102/61   Pulse (!) 101   Temp 97.9 F (36.6 C) (Oral)   Resp 18   Ht 5\' 4"  (1.626 m)   Wt 53.8 kg   SpO2 99%   BMI 20.36 kg/m   Intake/Output Summary (Last 24 hours) at 04/02/2023 1221 Last data filed at 04/02/2023 0840 Gross per 24 hour  Intake 113 ml  Output 1260 ml  Net -1147 ml   Weight change:   Physical Exam: GEN NAD, sitting in chair HEENT EOMI PERRL NECKno JVD PULM  clear CV RRR ABD soft EXT  slight RLE swelling. No LLE swelling NEURO AAO x 3 nonfocal SKIN + good turgor  Imaging: ECHOCARDIOGRAM COMPLETE  Result Date: 03/31/2023    ECHOCARDIOGRAM REPORT   Patient Name:   [name] Date of Exam: 03/31/2023 Medical Rec #:  [id number]         Height:       64.0 in Accession #:    [id number]        Weight:       114.6 lb Date of Birth:  28-Jan-1941         BSA:          1.544 m Patient Age:    81 years          BP:           110/70 mmHg Patient Gender: F                 HR:           89 bpm. Exam Location:  Inpatient Procedure: 2D Echo, Cardiac Doppler and Color Doppler Indications:    Atrial Fibrillation I48.91  History:        Patient has no prior history of Echocardiogram examinations.                 CAD, Mitral  Valve Disease; Risk Factors:Dyslipidemia, Non-Smoker                 and Hypertension.  Sonographer:    Dondra Prader RVT RCS Referring Phys: [phone] CHRISTOPHER P DANFORD IMPRESSIONS  1. Left ventricular ejection fraction, by estimation, is 50%. The left ventricle has mildly decreased function. The left ventricle demonstrates global hypokinesis. Left ventricular diastolic parameters are consistent with Grade I diastolic dysfunction (impaired relaxation).  2. Right ventricular systolic function is normal. The right ventricular size is normal. There is normal pulmonary artery systolic pressure. The estimated  right ventricular systolic pressure is 22.5 mmHg.  3. Left atrial size was mildly dilated.  4. Right atrial size was mildly dilated.  5. The mitral valve is normal in structure. Mild mitral valve regurgitation. No evidence of mitral stenosis.  6. The aortic valve is tricuspid. There is mild calcification of the aortic valve. Aortic valve regurgitation is trivial. No aortic stenosis is present.  7. The inferior vena cava is normal in size with greater than 50% respiratory variability, suggesting right atrial pressure of 3 mmHg. FINDINGS  Left Ventricle: Left ventricular ejection fraction, by estimation, is 50%. The left ventricle has mildly decreased function. The left ventricle demonstrates global hypokinesis. The left ventricular internal cavity size was normal in size. There is no left ventricular hypertrophy. Left ventricular diastolic parameters are consistent with Grade I diastolic dysfunction (impaired relaxation). Right Ventricle: The right ventricular size is normal. No increase in right ventricular wall thickness. Right ventricular systolic function is normal. There is normal pulmonary artery systolic pressure. The tricuspid regurgitant velocity is 2.21 m/s, and  with an assumed right atrial pressure of 3 mmHg, the estimated right ventricular systolic pressure is 22.5 mmHg. Left Atrium: Left atrial size  was mildly dilated. Right Atrium: Right atrial size was mildly dilated. Pericardium: Trivial pericardial effusion is present. Mitral Valve: The mitral valve is normal in structure. Mild mitral annular calcification. Mild mitral valve regurgitation. No evidence of mitral valve stenosis. Tricuspid Valve: The tricuspid valve is normal in structure. Tricuspid valve regurgitation is trivial. Aortic Valve: The aortic valve is tricuspid. There is mild calcification of the aortic valve. Aortic valve regurgitation is trivial. No aortic stenosis is present. Aortic valve mean gradient measures 3.0 mmHg. Aortic valve peak gradient measures 6.2 mmHg. Aortic valve area, by VTI measures 1.95 cm. Pulmonic Valve: The pulmonic valve was normal in structure. Pulmonic valve regurgitation is mild. Aorta: The aortic root is normal in size and structure. Venous: The inferior vena cava is normal in size with greater than 50% respiratory variability, suggesting right atrial pressure of 3 mmHg. IAS/Shunts: No atrial level shunt detected by color flow Doppler.  LEFT VENTRICLE PLAX 2D LVIDd:         3.90 cm   Diastology LVIDs:         2.30 cm   LV e' medial:    7.83 cm/s LV PW:         1.00 cm   LV E/e' medial:  8.1 LV IVS:        0.90 cm   LV e' lateral:   11.30 cm/s LVOT diam:     1.70 cm   LV E/e' lateral: 5.6 LV SV:         40 LV SV Index:   26 LVOT Area:     2.27 cm  RIGHT VENTRICLE             IVC RV Basal diam:  4.25 cm     IVC diam: 1.10 cm RV Mid diam:    2.90 cm RV S prime:     10.70 cm/s TAPSE (M-mode): 1.3 cm LEFT ATRIUM             Index        RIGHT ATRIUM           Index LA diam:        3.20 cm 2.07 cm/m   RA Area:     16.50 cm LA Vol (A2C):   47.3 ml 30.63 ml/m  RA Volume:  45.10 ml  29.20 ml/m LA Vol (A4C):   53.6 ml 34.71 ml/m LA Biplane Vol: 52.4 ml 33.93 ml/m  AORTIC VALVE                    PULMONIC VALVE AV Area (Vmax):    1.90 cm     PV Vmax:          0.84 m/s AV Area (Vmean):   1.84 cm     PV Peak grad:      2.8 mmHg AV Area (VTI):     1.95 cm     PR End Diast Vel: 4.33 msec AV Vmax:           124.00 cm/s AV Vmean:          83.400 cm/s AV VTI:            0.204 m AV Peak Grad:      6.2 mmHg AV Mean Grad:      3.0 mmHg LVOT Vmax:         104.00 cm/s LVOT Vmean:        67.600 cm/s LVOT VTI:          0.175 m LVOT/AV VTI ratio: 0.86 AR Vena Contracta: 0.20 cm  AORTA Ao Root diam: 3.00 cm Ao Asc diam:  3.10 cm MITRAL VALVE               TRICUSPID VALVE MV Area (PHT): 3.99 cm    TR Peak grad:   19.5 mmHg MV Decel Time: 190 msec    TR Vmax:        221.00 cm/s MV E velocity: 63.40 cm/s MV A velocity: 69.70 cm/s  SHUNTS MV E/A ratio:  0.91        Systemic VTI:  0.18 m                            Systemic Diam: 1.70 cm Dalton McleanMD Electronically signed by Wilfred Lacy Signature Date/Time: 03/31/2023/5:44:29 PM    Final     Labs: BMET Recent Labs  Lab 03/28/23 8469 03/29/23 0354 03/29/23 1757 03/31/23 0308 03/31/23 1647 04/01/23 0144 04/01/23 1404 04/02/23 0100  NA 126* 121* 124* 121* 121* 121* 123* 123*  K 4.2 4.2  --  4.0 4.5 4.0 4.1 3.5  CL 86* 87*  --  89* 87* 87* 89* 88*  CO2 30 26  --  25 25 24 26 26   GLUCOSE 116* 109*  --  118* 101* 145* 105* 119*  BUN 12 12  --  10 37* 51* 32* 47*  CREATININE 0.45 0.38*  --  0.37* 0.48 0.48 0.40* 0.41*  CALCIUM 8.4* 8.0*  --  8.0* 8.6* 8.8* 8.8* 8.8*  PHOS  --   --   --   --   --   --  2.8  --    CBC Recent Labs  Lab 03/27/23 0127 03/28/23 0152 04/01/23 0144 04/02/23 0100  WBC 8.2 7.5 9.2 6.3  HGB 7.9* 8.4* 7.9* 7.8*  HCT 22.2* 23.9* 22.7* 22.7*  MCV 91.0 92.6 93.8 91.5  PLT 268 309 330 369    Medications:     sodium chloride   Intravenous Once   apixaban  2.5 mg Oral BID   calcium carbonate  1 tablet Oral Q breakfast   cholecalciferol  1,000 Units Oral Daily   docusate sodium  100 mg Oral BID   furosemide  20  mg Intravenous Q12H   metoprolol tartrate  50 mg Oral BID   midodrine  10 mg Oral TID WC   pantoprazole  40 mg Oral Daily    pravastatin  40 mg Oral q1800   sodium chloride flush  3 mL Intravenous Q12H   sodium chloride  1 g Oral TID WC   urea  30 g Oral BID    Bufford Buttner MD 04/02/2023, 12:21 PM

## 2023-04-02 NOTE — Progress Notes (Signed)
Pt's BP reading was 86/23 w/ MAP of 63, rechecked manually and got reading of 84/58. Pt asymptomatic. Contacted on-call J. Garner Nash, NP bolus ordered and advised to hold 0400 dose of Lasix, will cont to monitor.

## 2023-04-02 NOTE — TOC Benefit Eligibility Note (Signed)
Pharmacy Patient Advocate Encounter  Insurance verification completed.    The patient is insured through [employer] Medicare Part D  Ran test claim for Eliquis 5 mg and the current 30 day co-pay is $47.00.   This test claim was processed through [facility]- copay amounts may vary at other pharmacies due to pharmacy/plan contracts, or as the patient moves through the different stages of their insurance plan.    Jeffrey Smith, CPHT Pharmacy Patient Advocate Specialist [facility] Patient Advocate Team Direct Number: [phone]  Fax: [phone] 

## 2023-04-03 DIAGNOSIS — E871 Hypo-osmolality and hyponatremia: Secondary | ICD-10-CM | POA: Diagnosis not present

## 2023-04-03 DIAGNOSIS — S72301A Unspecified fracture of shaft of right femur, initial encounter for closed fracture: Secondary | ICD-10-CM | POA: Diagnosis not present

## 2023-04-03 DIAGNOSIS — I480 Paroxysmal atrial fibrillation: Secondary | ICD-10-CM | POA: Diagnosis not present

## 2023-04-03 DIAGNOSIS — E785 Hyperlipidemia, unspecified: Secondary | ICD-10-CM | POA: Diagnosis not present

## 2023-04-03 MED ORDER — FUROSEMIDE 10 MG/ML IJ SOLN
20.0000 mg | Freq: Once | INTRAMUSCULAR | Status: AC
  Administered 2023-04-03: 20 mg via INTRAVENOUS

## 2023-04-03 MED ORDER — MIDODRINE HCL 5 MG PO TABS
15.0000 mg | ORAL_TABLET | Freq: Three times a day (TID) | ORAL | Status: DC
  Administered 2023-04-03 – 2023-04-04 (×3): 15 mg via ORAL
  Filled 2023-04-03 (×3): qty 3

## 2023-04-03 MED ORDER — FUROSEMIDE 10 MG/ML IJ SOLN: 20.0000 mg | Freq: Two times a day (BID) | INTRAMUSCULAR | Status: DC

## 2023-04-03 NOTE — Progress Notes (Signed)
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  R hip fracture: s/p IM nail   3.  HTN:             - off antihypertensives, BP low  - Increase midodrine to 15 TID  - also will get 1 u pRBCs today  4.  New onset Afib:  - TTE 50% EF  - dilated LA  - on metop and Eliquis   5.  Dispo: pending  Subjective:    Got blood yesterday,  Ankle edema worsening.      Objective:   BP 102/61 (BP Location: Right Arm)   Pulse (!) 105   Temp (!) 97.4 F (36.3 C)   Resp 16   Ht 5\' 4"  (1.626 m)   Wt 53.8 kg   SpO2 98%   BMI 20.36 kg/m   Intake/Output Summary (Last 24 hours) at 04/03/2023 1143 Last data filed at 04/03/2023 0532 Gross per 24 hour  Intake 267 ml  Output 1300 ml  Net -1033 ml   Weight change:   Physical Exam: GEN NAD, sitting in chair HEENT EOMI PERRL NECKno JVD PULM  clear CV RRR ABD soft EXT 1+ bilateral LE edema NEURO AAO x 3 nonfocal SKIN + good turgor  Imaging: No results found.  Labs: BMET Recent Labs  Lab 03/31/23 0308 03/31/23 1647 04/01/23 0144 04/01/23 1404 04/02/23 0100 04/02/23 1657 04/03/23 0412  NA 121* 121* 121* 123* 123* 124* 123*  K 4.0 4.5 4.0 4.1 3.5 3.9 3.5  CL 89* 87* 87* 89* 88* 89* 87*  CO2 25 25 24 26 26 22 25   GLUCOSE 118* 101* 145* 105* 119* 142* 108*  BUN 10 37* 51* 32* 47* 20 23  CREATININE 0.37* 0.48 0.48 0.40* 0.41* 0.58 0.45  CALCIUM 8.0* 8.6* 8.8* 8.8* 8.8* 8.8* 8.6*  PHOS  --   --   --  2.8  --  3.7  --    CBC Recent Labs  Lab 03/28/23 0152 04/01/23 0144 04/02/23 0100 04/03/23 0412  WBC 7.5 9.2 6.3 6.5  HGB 8.4* 7.9* 7.8* 9.7*  HCT 23.9* 22.7*  22.7* 27.6*  MCV 92.6 93.8 91.5 89.3  PLT 309 330 369 390    Medications:     apixaban  2.5 mg Oral BID   calcium carbonate  1 tablet Oral Q breakfast   cholecalciferol  1,000 Units Oral Daily   docusate sodium  100 mg Oral BID   furosemide  20 mg Intravenous Q12H   furosemide  20 mg Intravenous Once   metoprolol tartrate  100 mg Oral BID   midodrine  15 mg Oral TID WC   pantoprazole  40 mg Oral Daily   pravastatin  40 mg Oral q1800   sodium chloride flush  3 mL Intravenous Q12H   sodium chloride  1 g Oral TID WC   urea  30 g Oral BID    Bufford Buttner MD 04/03/2023, 11:43 AM

## 2023-04-03 NOTE — Plan of Care (Signed)

## 2023-04-03 NOTE — Progress Notes (Signed)
   04/03/23 1214  Mobility  Activity Ambulated with assistance in room  Level of Assistance Contact guard assist, steadying assist  Assistive Device Front wheel walker  Distance Ambulated (ft) 25 ft  Activity Response Tolerated well  Mobility Referral Yes  $Mobility charge 1 Mobility  Mobility Specialist Start Time (ACUTE ONLY) 1159  Mobility Specialist Stop Time (ACUTE ONLY) 1214  Mobility Specialist Time Calculation (min) (ACUTE ONLY) 15 min   Mobility Specialist: Progress Note  Pre-Mobility: HR 80's Afib During Mobility: HR, 98-114 Afib Post-Mobility: HR 95 Afib  Pt received in chair, agreeable to mobility session. MinG throughout using RW. After session, pt returned to chair with all needs met. Call bell within reach.   Barnie Mort Mobility Specialist Please contact via SecureChat or Rehab office at [phone]

## 2023-04-03 NOTE — Progress Notes (Signed)
PROGRESS NOTE  [name] WGN:[id number] DOB: 08-Nov-1940   PCP: [name], MD  Patient is from: Home  DOA: 03/24/2023 LOS: 10  Chief complaints Chief Complaint  Patient presents with   Fall   Leg Injury   Head Injury     Brief Narrative / Interim history: 82 y.o. F with HTN and HLD who presented after a mechanical fall and was found to have R midshaft femoral fracture.  Underwent intramedullary nailing on 03/24/2023.  Hospital course complicated by new onset A-fib Hyponatremia.  Nephrology following for hyponatremia.  Therapy recommended SNF  Subjective: Seen and examined earlier this morning.  No major events overnight of this morning.  No complaints.  Remains in A-fib with mild RVR to 100s.  Denies chest pain, dyspnea, GI or UTI symptoms.  Objective: Vitals:   04/02/23 1928 04/03/23 0517 04/03/23 0735 04/03/23 1218  BP: 107/66 97/63 102/61 110/69  Pulse:  96 (!) 105 93  Resp: 17  16   Temp: 98.2 F (36.8 C) 98 F (36.7 C) (!) 97.4 F (36.3 C)   TempSrc:  Oral    SpO2: 100% 95% 98%   Weight:      Height:        Examination:  GENERAL: No apparent distress.  Nontoxic. HEENT: MMM.  Vision and hearing grossly intact.  NECK: Supple.  No apparent JVD.  RESP:  No IWOB.  Fair aeration bilaterally. CVS:  IR. HR in 100's. Heart sounds normal.  ABD/GI/GU: BS+. Abd soft, NTND.  MSK/EXT:  Moves extremities. No apparent deformity. No edema.  SKIN: Surgical wound clean. Some bruise posteriourly NEURO: Awake, alert and oriented appropriately.  No apparent focal neuro deficit. PSYCH: Calm. Normal affect.   Procedures:  03/24/2023-intramedullary nailing of right midshaft femoral fracture  Microbiology summarized: None  Assessment and plan: Principal Problem:   Right femoral shaft fracture (HCC) Active Problems:   Hyponatremia due to SIADH   Paroxysmal atrial fibrillation (HCC)   Dyslipidemia   Essential hypertension   Normocytic anemia   Vitamin D  deficiency  Mechanical fall at home Right femoral shaft fracture due to mechanical fall -S/p reduction and internal fixation 03/24/23 by Dr. [name] -WBAT and ROM as tolerated -Continue vit D and calcium at d/c -On Eliquis for A-fib which would serve VTE prophylaxis -Pain control -Bowel regimen -PT/OT-recommended SNF.   New onset A-fib: Remains in A-fib but HR improved.  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  TSH normal.  TTE with mildly reduced EF but no significant valvular disease.  TSH normal. -Continue metoprolol and Eliquis -Optimize electrolytes  Hyponatremia due to SIADH: Na 128 on admission.  137-139 in 09/2022.  TSH and cortisol normal.  Reviewed nephrology note.  Concerned about hypovolemia. -Appreciate help by nephrology -P.o. sodium chloride, fluid restriction -Discontinued IV Lasix due to concern for hypovolemia.  Hypotension: Improved. -Midodrine increased to 15 mg 3 times daily.   Vitamin D deficiency -Continue vitamin D and calcium   Normocytic anemia: Hgb dropped to 7.8.  Transfused 1 unit on 7/31. Recent Labs    08/14/22 0818 03/24/23 1310 03/24/23 2158 03/25/23 0900 03/26/23 0114 03/27/23 0127 03/28/23 0152 04/01/23 0144 04/02/23 0100 04/03/23 0412  HGB 12.4 11.1* 8.7* 8.6* 8.1* 7.9* 8.4* 7.9* 7.8* 9.7*  -Monitor H&H -Anemia panel  Dyslipidemia -Continue statin  Body mass index is 20.36 kg/m.           DVT prophylaxis:  apixaban (ELIQUIS) tablet 2.5 mg Start: 04/01/23 1030 SCDs Start: 03/24/23 2059 apixaban (  ELIQUIS) tablet 2.5 mg  Code Status: Full code Family Communication: None at the side Level of care: Telemetry Medical Status is: Inpatient Remains inpatient appropriate because: Right femoral fracture, new onset A-fib, hyponatremia   Final disposition: SNF Consultants:  Orthopedic surgery Nephrology  35 minutes with more than 50% spent in reviewing records, counseling patient/family and coordinating  care.   Sch Meds:  Scheduled Meds:  apixaban  2.5 mg Oral BID   calcium carbonate  1 tablet Oral Q breakfast   cholecalciferol  1,000 Units Oral Daily   docusate sodium  100 mg Oral BID   metoprolol tartrate  100 mg Oral BID   midodrine  15 mg Oral TID WC   pantoprazole  40 mg Oral Daily   pravastatin  40 mg Oral q1800   sodium chloride flush  3 mL Intravenous Q12H   sodium chloride  1 g Oral TID WC   urea  30 g Oral BID   Continuous Infusions:  sodium chloride     methocarbamol (ROBAXIN) IV     PRN Meds:.sodium chloride, acetaminophen, alum & mag hydroxide-simeth, hydrALAZINE, HYDROcodone-acetaminophen, HYDROcodone-acetaminophen, menthol-cetylpyridinium, methocarbamol **OR** methocarbamol (ROBAXIN) IV, metoCLOPramide **OR** metoCLOPramide (REGLAN) injection, morphine injection, ondansetron **OR** ondansetron (ZOFRAN) IV, polyethylene glycol, sodium chloride flush  Antimicrobials: Anti-infectives (From admission, onward)    Start     Dose/Rate Route Frequency Ordered Stop   03/25/23 0100  ceFAZolin (ANCEF) IVPB 2g/100 mL premix        2 g 200 mL/hr over 30 Minutes Intravenous Every 8 hours 03/24/23 2058 03/25/23 1725   03/24/23 1615  ceFAZolin (ANCEF) IVPB 2g/100 mL premix        2 g 200 mL/hr over 30 Minutes Intravenous On call to O.R. 03/24/23 1555 03/24/23 1820   03/24/23 1519  ceFAZolin (ANCEF) 2-4 GM/100ML-% IVPB       Note to Pharmacy: Lurena Nida: cabinet override      03/24/23 1519 03/24/23 1845        I have personally reviewed the following labs and images: CBC: Recent Labs  Lab 03/28/23 0152 04/01/23 0144 04/02/23 0100 04/03/23 0412  WBC 7.5 9.2 6.3 6.5  HGB 8.4* 7.9* 7.8* 9.7*  HCT 23.9* 22.7* 22.7* 27.6*  MCV 92.6 93.8 91.5 89.3  PLT 309 330 369 390   BMP &GFR Recent Labs  Lab 03/31/23 1647 04/01/23 0144 04/01/23 1404 04/02/23 0100 04/02/23 1657 04/03/23 0412  NA 121* 121* 123* 123* 124* 123*  K 4.5 4.0 4.1 3.5 3.9 3.5  CL 87* 87* 89*  88* 89* 87*  CO2 25 24 26 26 22 25   GLUCOSE 101* 145* 105* 119* 142* 108*  BUN 37* 51* 32* 47* 20 23  CREATININE 0.48 0.48 0.40* 0.41* 0.58 0.45  CALCIUM 8.6* 8.8* 8.8* 8.8* 8.8* 8.6*  MG 1.8  --   --   --   --   --   PHOS  --   --  2.8  --  3.7  --    Estimated Creatinine Clearance: 46 mL/min (by C-G formula based on SCr of 0.45 mg/dL). Liver & Pancreas: Recent Labs  Lab 04/01/23 1404 04/02/23 1657 04/03/23 0412  AST  --   --  18  ALT  --   --  18  ALKPHOS  --   --  61  BILITOT  --   --  1.0  PROT  --   --  5.0*  ALBUMIN 2.4* 2.5* 2.3*   No results for input(s): "LIPASE", "AMYLASE" in  the last 168 hours. No results for input(s): "AMMONIA" in the last 168 hours. Diabetic: No results for input(s): "HGBA1C" in the last 72 hours. No results for input(s): "GLUCAP" in the last 168 hours. Cardiac Enzymes: No results for input(s): "CKTOTAL", "CKMB", "CKMBINDEX", "TROPONINI" in the last 168 hours. No results for input(s): "PROBNP" in the last 8760 hours. Coagulation Profile: No results for input(s): "INR", "PROTIME" in the last 168 hours. Thyroid Function Tests: No results for input(s): "TSH", "T4TOTAL", "FREET4", "T3FREE", "THYROIDAB" in the last 72 hours. Lipid Profile: No results for input(s): "CHOL", "HDL", "LDLCALC", "TRIG", "CHOLHDL", "LDLDIRECT" in the last 72 hours. Anemia Panel: No results for input(s): "VITAMINB12", "FOLATE", "FERRITIN", "TIBC", "IRON", "RETICCTPCT" in the last 72 hours. Urine analysis: No results found for: "COLORURINE", "APPEARANCEUR", "LABSPEC", "PHURINE", "GLUCOSEU", "HGBUR", "BILIRUBINUR", "KETONESUR", "PROTEINUR", "UROBILINOGEN", "NITRITE", "LEUKOCYTESUR" Sepsis Labs: Invalid input(s): "PROCALCITONIN", "LACTICIDVEN"  Microbiology: Recent Results (from the past 240 hour(s))  Surgical pcr screen     Status: None   Collection Time: 03/24/23  3:02 PM   Specimen: Nasal Mucosa; Nasal Swab  Result Value Ref Range Status   MRSA, PCR NEGATIVE NEGATIVE  Final   Staphylococcus aureus NEGATIVE NEGATIVE Final    Comment: (NOTE) The Xpert SA Assay (FDA approved for NASAL specimens in patients 42 years of age and older), is one component of a comprehensive surveillance program. It is not intended to diagnose infection nor to guide or monitor treatment. Performed at [facility], [address] [place], [place] [address]     Radiology Studies: No results found.    Terena Bohan T. Chyanna Flock [facility]  If 7PM-7AM, please contact night-coverage [email or web address] 04/03/2023, 1:05 PM

## 2023-04-03 NOTE — Progress Notes (Signed)
Physical Therapy Treatment Patient Details Name: [name] MRN: [id number] DOB: Aug 16, 1941 Today's Date: 04/03/2023   History of Present Illness Pt is an 82 y.o. female who presented 03/24/23 s/p fall, sustaining an acute displaced fracture of the right femoral diaphysis. S/p IM nailing of R femoral shaft fx 7/22. PMH: GERD, osteoporosis, HLD    PT Comments  Pt has slow progress towards goals. Pt is limited by endurance with high heart rate with very minimal activity. Pt requires Supervision to Min A for all functional mobility and is demonstrating impaired balance and endurance placing pt at high risk for falls. Due to pt current functional status recommending skilled physical therapy services at a higher level of care and frequency (5x/weekly) In order to improve balance, strength and endurance to decrease risk for falls, immobility, injury and re-hospitalization.     If plan is discharge home, recommend the following: A lot of help with walking and/or transfers;A lot of help with bathing/dressing/bathroom;Assistance with cooking/housework;Assist for transportation;Help with stairs or ramp for entrance   Can travel by private vehicle     No  Equipment Recommendations  Rolling walker (2 wheels);BSC/3in1    Recommendations for Other Services       Precautions / Restrictions Precautions Precautions: Fall Precaution Comments: watch BP, Watch HR Restrictions Weight Bearing Restrictions: Yes RLE Weight Bearing: Weight bearing as tolerated     Mobility  Bed Mobility Overal bed mobility: Needs Assistance Bed Mobility: Supine to Sit     Supine to sit: Supervision     General bed mobility comments: Pt left in recliner at end of session Patient Response: Cooperative  Transfers Overall transfer level: Needs assistance Equipment used: Rolling walker (2 wheels) Transfers: Sit to/from Stand Sit to Stand: Min assist           General transfer comment: Min A for hand  placement and mild imbalances with standing. Rocking to build momentum to perform initial movement for sit to stand    Ambulation/Gait Ambulation/Gait assistance: Min guard Gait Distance (Feet): 40 Feet Assistive device: Rolling walker (2 wheels) Gait Pattern/deviations: Step-to pattern, Narrow base of support   Gait velocity interpretation: <1.31 ft/sec, indicative of household ambulator   General Gait Details: HR up to 144 bpm with short distance gait down to 137 bpm but no lower. Pt was fatigued and session ended     Tilt Bed Tilt Bed Patient Response: Cooperative  Modified Rankin (Stroke Patients Only)       Balance Overall balance assessment: Needs assistance Sitting-balance support: Feet supported, Bilateral upper extremity supported Sitting balance-Leahy Scale: Good     Standing balance support: Bilateral upper extremity supported, Reliant on assistive device for balance, During functional activity Standing balance-Leahy Scale: Fair Standing balance comment: reliant on RW for support dynamically        Cognition Arousal/Alertness: Awake/alert Behavior During Therapy: WFL for tasks assessed/performed Overall Cognitive Status: Within Functional Limits for tasks assessed           General Comments General comments (skin integrity, edema, etc.): HR up to 144 bpm with very short distance gait      Pertinent Vitals/Pain Pain Assessment Pain Assessment: Faces Faces Pain Scale: Hurts little more Pain Location: R femur/hip Pain Descriptors / Indicators: Discomfort, Grimacing, Operative site guarding Pain Intervention(s): Monitored during session, Limited activity within patient's tolerance           PT Goals (current goals can now be found in the care plan section) Acute Rehab PT Goals Patient Stated  Goal: to improve PT Goal Formulation: With patient Time For Goal Achievement: 04/08/23 Potential to Achieve Goals: Good Progress towards PT goals:  Progressing toward goals    Frequency    Min 1X/week      PT Plan Current plan remains appropriate       AM-PAC PT "6 Clicks" Mobility   Outcome Measure  Help needed turning from your back to your side while in a flat bed without using bedrails?: A Little Help needed moving from lying on your back to sitting on the side of a flat bed without using bedrails?: A Little Help needed moving to and from a bed to a chair (including a wheelchair)?: A Little Help needed standing up from a chair using your arms (e.g., wheelchair or bedside chair)?: A Little Help needed to walk in hospital room?: A Little Help needed climbing 3-5 steps with a railing? : A Little 6 Click Score: 18    End of Session Equipment Utilized During Treatment: Gait belt Activity Tolerance: Patient limited by fatigue;Other (comment) (HR up to 144 bpm) Patient left: with call bell/phone within reach;in chair;with chair alarm set Nurse Communication: Mobility status PT Visit Diagnosis: Unsteadiness on feet (R26.81);Other abnormalities of gait and mobility (R26.89);Muscle weakness (generalized) (M62.81);History of falling (Z91.81);Difficulty in walking, not elsewhere classified (R26.2);Pain Pain - Right/Left: Right Pain - part of body: Hip     Time: 1610-9604 PT Time Calculation (min) (ACUTE ONLY): 14 min  Charges:    $Therapeutic Activity: 8-22 mins PT General Charges $$ ACUTE PT VISIT: 1 Visit                     Harrel Carina, DPT, CLT  Acute Rehabilitation Services Office: [phone] (Secure chat preferred)    Claudia Desanctis 04/03/2023, 2:17 PM

## 2023-04-04 DIAGNOSIS — S72301A Unspecified fracture of shaft of right femur, initial encounter for closed fracture: Secondary | ICD-10-CM | POA: Diagnosis not present

## 2023-04-04 DIAGNOSIS — I480 Paroxysmal atrial fibrillation: Secondary | ICD-10-CM | POA: Diagnosis not present

## 2023-04-04 DIAGNOSIS — E559 Vitamin D deficiency, unspecified: Secondary | ICD-10-CM | POA: Diagnosis not present

## 2023-04-04 DIAGNOSIS — E871 Hypo-osmolality and hyponatremia: Secondary | ICD-10-CM | POA: Diagnosis not present

## 2023-04-04 MED ORDER — FUROSEMIDE 40 MG PO TABS: 40.0000 mg | ORAL_TABLET | Freq: Every day | ORAL | Status: DC

## 2023-04-04 MED ORDER — MIDODRINE HCL 5 MG PO TABS: 15.0000 mg | ORAL_TABLET | Freq: Three times a day (TID) | ORAL | Status: DC

## 2023-04-04 MED ORDER — SENNOSIDES-DOCUSATE SODIUM 8.6-50 MG PO TABS: 1.0000 | ORAL_TABLET | Freq: Two times a day (BID) | ORAL | Status: DC | PRN

## 2023-04-04 MED ORDER — POLYETHYLENE GLYCOL 3350 17 GM/SCOOP PO POWD: 17.0000 g | Freq: Two times a day (BID) | ORAL | Status: AC | PRN

## 2023-04-04 MED ORDER — FUROSEMIDE 40 MG PO TABS: 40.0000 mg | ORAL_TABLET | Freq: Two times a day (BID) | ORAL | Status: DC

## 2023-04-04 MED ORDER — SODIUM CHLORIDE 1 G PO TABS: 1.0000 g | ORAL_TABLET | Freq: Three times a day (TID) | ORAL | Status: AC

## 2023-04-04 MED ORDER — METOPROLOL TARTRATE 100 MG PO TABS: 100.0000 mg | ORAL_TABLET | Freq: Two times a day (BID) | ORAL | Status: DC

## 2023-04-04 MED ORDER — APIXABAN 2.5 MG PO TABS: 2.5000 mg | ORAL_TABLET | Freq: Two times a day (BID) | ORAL | Status: DC

## 2023-04-04 NOTE — Progress Notes (Signed)
TOC Expediter    Spoke with Almira Coaster in Admissions at [facility] [phone].  If d/c summary can be completed by 13:00, patient could transfer at 14:00.  Patient would transport to [facility] Room 710 and report can be called [phone] nurse's station 3. Reached out to attending.  To secure DC Summary if appropriate and notified covering [facility] team member,  Discharge Summary in as writing this note.  Raynelle Highland MSW, CMSW, CCM

## 2023-04-04 NOTE — TOC Transition Note (Signed)
Transition of Care [facility]) - CM/SW Discharge Note   Patient Details  Name: [name] MRN: [id number] Date of Birth: May 19, 1941  Transition of Care [facility]) CM/SW Contact:  Lorri Frederick, LCSW Phone Number: 04/04/2023, 12:03 PM   Clinical Narrative:   Patient will DC to [facility] Room 710. RN call report to [phone] nurse's station 3.     Final next level of care: Skilled Nursing Facility Barriers to Discharge: Barriers Resolved   Patient Goals and CMS Choice      Discharge Placement                Patient chooses bed at: [facility] [place] Patient to be transferred to facility by: [facility] Name of family member notified: son Minerva Areola Patient and family notified of of transfer: 04/04/23  Discharge Plan and Services Additional resources added to the After Visit Summary for                                       Social Determinants of Health (SDOH) Interventions SDOH Screenings   Food Insecurity: No Food Insecurity (03/24/2023)  Housing: Low Risk  (03/24/2023)  Transportation Needs: No Transportation Needs (03/24/2023)  Utilities: Not At Risk (03/24/2023)  Financial Resource Strain: Low Risk  (02/17/2023)   Received from [facility]  Tobacco Use: Low Risk  (03/24/2023)     Readmission Risk Interventions     No data to display

## 2023-04-04 NOTE — Plan of Care (Signed)

## 2023-04-04 NOTE — Discharge Summary (Addendum)
Physician Discharge Summary  [name]:[id number] DOB: 03/08/1941 DOA: 03/24/2023  PCP: [name], MD  Admit date: 03/24/2023 Discharge date: 04/04/2023 Admitted From: Home Disposition: SNF Recommendations for Outpatient Follow-up:  Follow up with orthopedic surgery as below Outpatient follow-up with cardiology in 2 to 3 weeks Check CMP and CBC in 1 week. Please follow up on the following pending results: None  Discharge Condition: Stable CODE STATUS: Full code  Contact information for follow-up providers     Haddix, [name], MD. Schedule an appointment as soon as possible for a visit in 2 week(s).   Specialty: Orthopedic Surgery Why: for wound check and repeat x-rays Contact information: [address] [place] [address] [phone]              Contact information for after-discharge care     Destination     [facility] Preferred SNF .   Service: Skilled Nursing Contact information: [address] [place] [address] [phone]                     Hospital course 82 y.o. F with HTN and HLD who presented after a mechanical fall and was found to have R midshaft femoral fracture.  Underwent intramedullary nailing on 03/24/2023.  Hospital course complicated by new onset A-fib with RVR and hyponatremia.  Patient remained in A-fib but heart rate improved with p.o. metoprolol.  Started on Eliquis for anticoagulation.  Discharged on metoprolol and Eliquis.  Patient has referral to cardiology from PCP.  Recommend follow-up with cardiology in 2 to 3 weeks.  In regards to hyponatremia, urine chemistry suggested SIADH.  Nephrology consulted.  Started on p.o. sodium chloride and fluid restriction with improvement in sodium level from 123-129.  She is discharged on p.o. sodium chloride 1 g 3 times daily.  Recheck CMP in about a week.  See individual problem list below for more.   Problems addressed during  this hospitalization Principal Problem:   Right femoral shaft fracture (HCC) Active Problems:   Hyponatremia due to SIADH   Paroxysmal atrial fibrillation (HCC)   Dyslipidemia   Essential hypertension   Normocytic anemia   Vitamin D deficiency   Mechanical fall at home Right femoral shaft fracture due to mechanical fall -S/p reduction and internal fixation 03/24/23 by Dr. [name] -WBAT and ROM as tolerated -Continue vit D and calcium at d/c -On Eliquis for A-fib which would serve VTE prophylaxis -Tylenol, Robaxin and Norco for pain control per orthopedic surgery -Bowel regimen -PT/OT-recommended SNF.   New onset A-fib: Remains in A-fib but HR improved.  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  TSH normal.  TTE with mildly reduced EF but no significant valvular disease.  TSH normal. -Continue metoprolol and Eliquis -Follow-up with cardiology in 2 to 3 weeks   Hyponatremia due to SIADH: Sodium improved from 123-129.  TSH and cortisol normal.  Reviewed nephrology note.  -Continue oral sodium chloride 1 g 3 times daily -P.o. Lasix 40 mg daily.   Hypotension: Improved. -Midodrine increased to 15 mg 3 times daily.  Wean as able.   Vitamin D deficiency -Continue vitamin D and calcium   Normocytic anemia: Hgb dropped to 7.8.  Transfused 1 unit on 7/31.  Anemia panel basically normal. Recent Labs    03/24/23 1310 03/24/23 2158 03/25/23 0900 03/26/23 0114 03/27/23 0127 03/28/23 0152 04/01/23 0144 04/02/23 0100 04/03/23 0412 04/04/23 0812  HGB 11.1* 8.7* 8.6* 8.1* 7.9* 8.4* 7.9*  7.8* 9.7* 10.4*  -Recheck CBC in 1 week  Dyslipidemia -Continue statin           Time spent 35 minutes  Vital signs Vitals:   04/03/23 1526 04/03/23 2020 04/04/23 0425 04/04/23 0740  BP: 103/72 (!) 99/53 114/72 107/72  Pulse: 87 94 99   Temp: 97.6 F (36.4 C) 97.9 F (36.6 C)  97.8 F (36.6 C)  Resp: 16 16 16 16   Height:      Weight:      SpO2: 98% 99% 94% 97%   TempSrc:  Oral  Oral  BMI (Calculated):         Discharge exam  GENERAL: No apparent distress.  Nontoxic. HEENT: MMM.  Vision and hearing grossly intact.  NECK: Supple.  No apparent JVD.  RESP:  No IWOB.  Fair aeration bilaterally. CVS: Irregular rhythm.  Normal rate.  Heart sounds normal.  ABD/GI/GU: BS+. Abd soft, NTND.  MSK/EXT:  Moves extremities. No apparent deformity. No edema.  SKIN: no apparent skin lesion or wound NEURO: Awake and alert. Oriented appropriately.  No apparent focal neuro deficit. PSYCH: Calm. Normal affect.   Discharge Instructions Discharge Instructions     Diet general   Complete by: As directed    Increase activity slowly   Complete by: As directed       Allergies as of 04/04/2023       Reactions   Bisphosphonates Other (See Comments)   Swallowing problems    Boniva [ibandronic Acid] Other (See Comments)   Swallowing problems    Fosamax [alendronate Sodium] Other (See Comments)   Swallowing problems    Lipitor [atorvastatin] Other (See Comments)   Knee pain    Etodolac Rash        Medication List     STOP taking these medications    aspirin EC 81 MG tablet   metoprolol succinate 25 MG 24 hr tablet Commonly known as: TOPROL-XL       TAKE these medications    acetaminophen 325 MG tablet Commonly known as: TYLENOL Take 1-2 tablets (325-650 mg total) by mouth every 6 (six) hours as needed for mild pain (pain score 1-3 or temp > 100.5).   apixaban 2.5 MG Tabs tablet Commonly known as: ELIQUIS Take 1 tablet (2.5 mg total) by mouth 2 (two) times daily.   famotidine 40 MG tablet Commonly known as: PEPCID Take 40 mg by mouth at bedtime.   furosemide 40 MG tablet Commonly known as: LASIX Take 1 tablet (40 mg total) by mouth daily.   HYDROcodone-acetaminophen 7.5-325 MG tablet Commonly known as: NORCO Take 1 tablet by mouth every 4 (four) hours as needed for severe pain.   lovastatin 40 MG tablet Commonly known as:  MEVACOR Take 40 mg by mouth at bedtime.   methocarbamol 500 MG tablet Commonly known as: ROBAXIN Take 1 tablet (500 mg total) by mouth every 6 (six) hours as needed for muscle spasms.   metoprolol tartrate 100 MG tablet Commonly known as: LOPRESSOR Take 1 tablet (100 mg total) by mouth 2 (two) times daily.   midodrine 5 MG tablet Commonly known as: PROAMATINE Take 3 tablets (15 mg total) by mouth 3 (three) times daily with meals.   multivitamin tablet Take 1 tablet by mouth daily.   omeprazole 40 MG capsule Commonly known as: PRILOSEC Take 40 mg by mouth daily.   ondansetron 4 MG disintegrating tablet Commonly known as: ZOFRAN-ODT Take 1 tablet (4 mg total) by mouth every 8 (eight) hours  as needed for nausea or vomiting.   polyethylene glycol powder 17 GM/SCOOP powder Commonly known as: MiraLax Take 17 g by mouth 2 (two) times daily as needed for moderate constipation.   PROLIA  Inject 1 Dose into the skin every 6 (six) months.   senna-docusate 8.6-50 MG tablet Commonly known as: Senokot-S Take 1 tablet by mouth 2 (two) times daily between meals as needed for mild constipation.   sodium chloride 1 g tablet Take 1 tablet (1 g total) by mouth 3 (three) times daily with meals for 10 days.   triamcinolone 0.025 % ointment Commonly known as: KENALOG Apply 1 Application topically 2 (two) times daily. For 3-5 days.   vitamin D3 25 MCG tablet Commonly known as: CHOLECALCIFEROL Take 2 tablets (2,000 Units total) by mouth daily.        Consultations: Orthopedic surgery Nephrology  Procedures/Studies: 03/24/2023-intramedullary nailing of right midshaft femoral fracture    ECHOCARDIOGRAM COMPLETE  Result Date: 03/31/2023    ECHOCARDIOGRAM REPORT   Patient Name:   KINSLIE HOVE Date of Exam: 03/31/2023 Medical Rec #:  [id number]         Height:       64.0 in Accession #:    [id number]        Weight:       114.6 lb Date of Birth:  [birth year]         BSA:           1.544 m Patient Age:    81 years          BP:           110/70 mmHg Patient Gender: F                 HR:           89 bpm. Exam Location:  Inpatient Procedure: 2D Echo, Cardiac Doppler and Color Doppler Indications:    Atrial Fibrillation I48.91  History:        Patient has no prior history of Echocardiogram examinations.                 CAD, Mitral Valve Disease; Risk Factors:Dyslipidemia, Non-Smoker                 and Hypertension.  Sonographer:    Dondra Prader RVT RCS Referring Phys: [phone] CHRISTOPHER P DANFORD IMPRESSIONS  1. Left ventricular ejection fraction, by estimation, is 50%. The left ventricle has mildly decreased function. The left ventricle demonstrates global hypokinesis. Left ventricular diastolic parameters are consistent with Grade I diastolic dysfunction (impaired relaxation).  2. Right ventricular systolic function is normal. The right ventricular size is normal. There is normal pulmonary artery systolic pressure. The estimated right ventricular systolic pressure is 22.5 mmHg.  3. Left atrial size was mildly dilated.  4. Right atrial size was mildly dilated.  5. The mitral valve is normal in structure. Mild mitral valve regurgitation. No evidence of mitral stenosis.  6. The aortic valve is tricuspid. There is mild calcification of the aortic valve. Aortic valve regurgitation is trivial. No aortic stenosis is present.  7. The inferior vena cava is normal in size with greater than 50% respiratory variability, suggesting right atrial pressure of 3 mmHg. FINDINGS  Left Ventricle: Left ventricular ejection fraction, by estimation, is 50%. The left ventricle has mildly decreased function. The left ventricle demonstrates global hypokinesis. The left ventricular internal cavity size was normal in size. There is no left ventricular hypertrophy. Left ventricular diastolic  parameters are consistent with Grade I diastolic dysfunction (impaired relaxation). Right Ventricle: The right ventricular size is  normal. No increase in right ventricular wall thickness. Right ventricular systolic function is normal. There is normal pulmonary artery systolic pressure. The tricuspid regurgitant velocity is 2.21 m/s, and  with an assumed right atrial pressure of 3 mmHg, the estimated right ventricular systolic pressure is 22.5 mmHg. Left Atrium: Left atrial size was mildly dilated. Right Atrium: Right atrial size was mildly dilated. Pericardium: Trivial pericardial effusion is present. Mitral Valve: The mitral valve is normal in structure. Mild mitral annular calcification. Mild mitral valve regurgitation. No evidence of mitral valve stenosis. Tricuspid Valve: The tricuspid valve is normal in structure. Tricuspid valve regurgitation is trivial. Aortic Valve: The aortic valve is tricuspid. There is mild calcification of the aortic valve. Aortic valve regurgitation is trivial. No aortic stenosis is present. Aortic valve mean gradient measures 3.0 mmHg. Aortic valve peak gradient measures 6.2 mmHg. Aortic valve area, by VTI measures 1.95 cm. Pulmonic Valve: The pulmonic valve was normal in structure. Pulmonic valve regurgitation is mild. Aorta: The aortic root is normal in size and structure. Venous: The inferior vena cava is normal in size with greater than 50% respiratory variability, suggesting right atrial pressure of 3 mmHg. IAS/Shunts: No atrial level shunt detected by color flow Doppler.  LEFT VENTRICLE PLAX 2D LVIDd:         3.90 cm   Diastology LVIDs:         2.30 cm   LV e' medial:    7.83 cm/s LV PW:         1.00 cm   LV E/e' medial:  8.1 LV IVS:        0.90 cm   LV e' lateral:   11.30 cm/s LVOT diam:     1.70 cm   LV E/e' lateral: 5.6 LV SV:         40 LV SV Index:   26 LVOT Area:     2.27 cm  RIGHT VENTRICLE             IVC RV Basal diam:  4.25 cm     IVC diam: 1.10 cm RV Mid diam:    2.90 cm RV S prime:     10.70 cm/s TAPSE (M-mode): 1.3 cm LEFT ATRIUM             Index        RIGHT ATRIUM           Index LA diam:         3.20 cm 2.07 cm/m   RA Area:     16.50 cm LA Vol (A2C):   47.3 ml 30.63 ml/m  RA Volume:   45.10 ml  29.20 ml/m LA Vol (A4C):   53.6 ml 34.71 ml/m LA Biplane Vol: 52.4 ml 33.93 ml/m  AORTIC VALVE                    PULMONIC VALVE AV Area (Vmax):    1.90 cm     PV Vmax:          0.84 m/s AV Area (Vmean):   1.84 cm     PV Peak grad:     2.8 mmHg AV Area (VTI):     1.95 cm     PR End Diast Vel: 4.33 msec AV Vmax:           124.00 cm/s AV Vmean:  83.400 cm/s AV VTI:            0.204 m AV Peak Grad:      6.2 mmHg AV Mean Grad:      3.0 mmHg LVOT Vmax:         104.00 cm/s LVOT Vmean:        67.600 cm/s LVOT VTI:          0.175 m LVOT/AV VTI ratio: 0.86 AR Vena Contracta: 0.20 cm  AORTA Ao Root diam: 3.00 cm Ao Asc diam:  3.10 cm MITRAL VALVE               TRICUSPID VALVE MV Area (PHT): 3.99 cm    TR Peak grad:   19.5 mmHg MV Decel Time: 190 msec    TR Vmax:        221.00 cm/s MV E velocity: 63.40 cm/s MV A velocity: 69.70 cm/s  SHUNTS MV E/A ratio:  0.91        Systemic VTI:  0.18 m                            Systemic Diam: 1.70 cm Dalton McleanMD Electronically signed by Wilfred Lacy Signature Date/Time: 03/31/2023/5:44:29 PM    Final    DG CHEST PORT 1 VIEW  Result Date: 03/31/2023 CLINICAL DATA:  Chest pain EXAM: PORTABLE CHEST - 1 VIEW COMPARISON:  03/24/2023 FINDINGS: Cardiomediastinal silhouette and pulmonary vasculature are within normal limits. Lungs are hyperexpanded but otherwise clear. IMPRESSION: No acute cardiopulmonary process. Electronically Signed   By: Acquanetta Belling M.D.   On: 03/31/2023 14:26   MR HIP RIGHT WO CONTRAST  Result Date: 03/28/2023 CLINICAL DATA:  Right hip pain for 7-8 weeks. No relief from the injection. Right hip is painful to bear weight. EXAM: MR OF THE RIGHT HIP WITHOUT CONTRAST TECHNIQUE: Multiplanar, multisequence MR imaging was performed. No intravenous contrast was administered. COMPARISON:  None Available. FINDINGS: Bone No hip fracture, dislocation  or avascular necrosis. No aggressive osseous lesion. SI joints are normal. No SI joint widening or erosive changes. Lower lumbar spine demonstrates no focal abnormality. Alignment Normal. No subluxation. Dysplasia None. Joint effusion No joint effusions. Labrum Degenerative changes of the labrum without evidence of acute tear. Cartilage Articular cartilage thinning prominent anterosuperiorly with subchondral cystic changes of the acetabular roof. Capsule and ligaments Normal. Muscles and Tendons Flexors: Normal. Extensors: Normal. Abductors: Normal. Adductors: Normal. Rotators: Normal. Hamstrings: Normal. Other Findings No bursal fluid. Viscera No abnormality seen in pelvis. No lymphadenopathy. No free fluid in the pelvis. IMPRESSION: 1. No evidence of hip fracture or osteonecrosis. 2. 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 3. Muscles and tendons are intact without evidence of significant tendinopathy or bursitis. Electronically Signed   By: Larose Hires D.O.   On: 03/28/2023 20:46   DG FEMUR PORT, MIN 2 VIEWS RIGHT  Result Date: 03/24/2023 CLINICAL DATA:  Status post ORIF right midshaft femoral fracture EXAM: RIGHT FEMUR PORTABLE 2 VIEW COMPARISON:  Intraoperative films from earlier in the same day. FINDINGS: Medullary rod is noted with proximal and distal fixation screws. Midshaft fracture is in near anatomic alignment. No soft tissue changes are noted. IMPRESSION: ORIF right femoral fracture. Electronically Signed   By: Alcide Clever M.D.   On: 03/24/2023 23:32   DG FEMUR, MIN 2 VIEWS RIGHT  Result Date: 03/24/2023 CLINICAL DATA:  Surgery EXAM: RIGHT  FEMUR 2 VIEWS COMPARISON:  Right femur x-ray 09/23/2022 FINDINGS: Eight intraoperative fluoroscopic views of the right femur. Right femoral intramedullary nail was placed with hip screws and distal interlocking screws fixating a mid femoral fracture. Alignment is  anatomic. Fluoroscopy time: 105 seconds. Fluoroscopy dose 5.03 micro gray. IMPRESSION: Intraoperative fluoroscopic views of the right femur. Electronically Signed   By: Darliss Cheney M.D.   On: 03/24/2023 23:12   DG C-Arm 1-60 Min-No Report  Result Date: 03/24/2023 Fluoroscopy was utilized by the requesting physician.  No radiographic interpretation.   CT FEMUR RIGHT WO CONTRAST  Result Date: 03/24/2023 CLINICAL DATA:  Right femur fracture EXAM: CT OF THE LOWER RIGHT EXTREMITY WITHOUT CONTRAST TECHNIQUE: Multidetector CT imaging of the right lower extremity was performed according to the standard protocol. RADIATION DOSE REDUCTION: This exam was performed according to the departmental dose-optimization program which includes automated exposure control, adjustment of the mA and/or kV according to patient size and/or use of iterative reconstruction technique. COMPARISON:  X-ray 03/24/2023 FINDINGS: Bones/Joint/Cartilage Acute mid shaft fracture of the right femur without comminution. There is 1 shaft width of posterior displacement and approximately 2.2 cm of fracture overlap/shortening. No underlying lytic or sclerotic lesion is identified to suggest a pathologic fracture at this location. Remainder of the femur is otherwise intact. No additional fractures. No dislocation at the hip or knee. 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 Ligaments Suboptimally assessed by CT. Muscles and Tendons No acute musculotendinous abnormality by CT. Soft tissues No organized hematoma.  No right inguinal lymphadenopathy. IMPRESSION: Acute displaced fracture of the right femoral diaphysis, as described. No underlying lytic or sclerotic lesion is identified to suggest a pathologic fracture at this location. Electronically Signed   By: Duanne Guess D.O.   On: 03/24/2023 15:46   CT HEAD WO CONTRAST  Result Date: 03/24/2023 CLINICAL DATA:  Trauma, fall EXAM: CT HEAD WITHOUT CONTRAST TECHNIQUE: Contiguous axial  images were obtained from the base of the skull through the vertex without intravenous contrast. RADIATION DOSE REDUCTION: This exam was performed according to the departmental dose-optimization program which includes automated exposure control, adjustment of the mA and/or kV according to patient size and/or use of iterative reconstruction technique. COMPARISON:  None Available. FINDINGS: Brain: No acute intracranial findings are seen. There are no signs of bleeding within the cranium. Cortical sulci are prominent. Ventricles are not dilated. Vascular: Unremarkable. Skull: No fracture is seen in calvarium. Sinuses/Orbits: Unremarkable. Other: None. IMPRESSION: No acute intracranial findings are seen in noncontrast CT brain. Atrophy. Electronically Signed   By: Ernie Avena M.D.   On: 03/24/2023 15:27   DG FEMUR, MIN 2 VIEWS RIGHT  Result Date: 03/24/2023 CLINICAL DATA:  Trip and fall, obvious femur deformity. EXAM: RIGHT FEMUR 2 VIEWS COMPARISON:  None Available. FINDINGS: There is a complete fracture of the midshaft of the femur with foreshortening of the fracture fragments at least 3 cm with proximal component apex anteromedial. No intra-articular extension. Mild degenerative change of the right hip is suspected with joint space loss and subchondral sclerosis. No evidence of avascular necrosis. Limited visualization of the knee appears normal given obliquity and field of view. No radiopaque foreign body. IMPRESSION: Complete fracture of the midshaft of the femur with foreshortening of the fracture fragments at least 3 cm. No intra-articular extension. Electronically Signed   By: Simonne Come M.D.   On: 03/24/2023 14:39   DG Chest Port 1 View  Result Date: 03/24/2023 CLINICAL DATA:  Tripped and fell.  Pain  EXAM: PORTABLE CHEST 1 VIEW COMPARISON:  Chest x-ray 08/14/2022 and CTA FINDINGS: Hyperinflation. Apical pleural thickening with chronic lung changes. No consolidation, pneumothorax or effusion. No  edema. Normal cardiopericardial silhouette. Overlapping cardiac leads. Degenerative changes along the spine. The right lower lobe pulmonary nodule seen by CT is not well identified by x-ray. Please see prior recommendation. IMPRESSION: Hyperinflation with chronic lung changes. The lung nodule identified by prior CT is not well defined on this x-ray. Please see prior recommendations. Electronically Signed   By: Karen Kays M.D.   On: 03/24/2023 14:28       The results of significant diagnostics from this hospitalization (including imaging, microbiology, ancillary and laboratory) are listed below for reference.     Microbiology: No results found for this or any previous visit (from the past 240 hour(s)).   Labs:  CBC: Recent Labs  Lab 04/01/23 0144 04/02/23 0100 04/03/23 0412 04/04/23 0812  WBC 9.2 6.3 6.5 6.2  HGB 7.9* 7.8* 9.7* 10.4*  HCT 22.7* 22.7* 27.6* 30.8*  MCV 93.8 91.5 89.3 92.8  PLT 330 369 390 475*   BMP &GFR Recent Labs  Lab 03/31/23 1647 04/01/23 0144 04/01/23 1404 04/02/23 0100 04/02/23 1657 04/03/23 0412 04/04/23 0812  NA 121*   < > 123* 123* 124* 123* 129*  K 4.5   < > 4.1 3.5 3.9 3.5 3.5  CL 87*   < > 89* 88* 89* 87* 90*  CO2 25   < > 26 26 22 25 29   GLUCOSE 101*   < > 105* 119* 142* 108* 105*  BUN 37*   < > 32* 47* 20 23 41*  CREATININE 0.48   < > 0.40* 0.41* 0.58 0.45 0.63  CALCIUM 8.6*   < > 8.8* 8.8* 8.8* 8.6* 9.2  MG 1.8  --   --   --   --   --  1.7  PHOS  --   --  2.8  --  3.7  --  4.2   < > = values in this interval not displayed.   Estimated Creatinine Clearance: 46 mL/min (by C-G formula based on SCr of 0.63 mg/dL). Liver & Pancreas: Recent Labs  Lab 04/01/23 1404 04/02/23 1657 04/03/23 0412 04/04/23 0812  AST  --   --  18  --   ALT  --   --  18  --   ALKPHOS  --   --  61  --   BILITOT  --   --  1.0  --   PROT  --   --  5.0*  --   ALBUMIN 2.4* 2.5* 2.3* 2.6*   No results for input(s): "LIPASE", "AMYLASE" in the last 168  hours. No results for input(s): "AMMONIA" in the last 168 hours. Diabetic: No results for input(s): "HGBA1C" in the last 72 hours. No results for input(s): "GLUCAP" in the last 168 hours. Cardiac Enzymes: No results for input(s): "CKTOTAL", "CKMB", "CKMBINDEX", "TROPONINI" in the last 168 hours. No results for input(s): "PROBNP" in the last 8760 hours. Coagulation Profile: No results for input(s): "INR", "PROTIME" in the last 168 hours. Thyroid Function Tests: No results for input(s): "TSH", "T4TOTAL", "FREET4", "T3FREE", "THYROIDAB" in the last 72 hours. Lipid Profile: No results for input(s): "CHOL", "HDL", "LDLCALC", "TRIG", "CHOLHDL", "LDLDIRECT" in the last 72 hours. Anemia Panel: Recent Labs    04/04/23 0812  VITAMINB12 546  FOLATE 24.0  FERRITIN 307  TIBC 340  IRON 50  RETICCTPCT 6.3*   Urine analysis: No results  found for: "COLORURINE", "APPEARANCEUR", "LABSPEC", "PHURINE", "GLUCOSEU", "HGBUR", "BILIRUBINUR", "KETONESUR", "PROTEINUR", "UROBILINOGEN", "NITRITE", "LEUKOCYTESUR" Sepsis Labs: Invalid input(s): "PROCALCITONIN", "LACTICIDVEN"   SIGNED:  Almon Hercules, MD  [facility] 04/04/2023, 1:18 PM

## 2023-04-04 NOTE — Progress Notes (Signed)
Patient discharged, report called and given to the nurse Fleet Contras, LPN at [facility]. No additional questions and or concerns at this time. Patient IV removed per protocol. Patient awaiting PTAR to arrive to discharge to facility

## 2023-04-10 ENCOUNTER — Encounter: Payer: Medicare HMO | Admitting: Dermatology

## 2023-04-14 ENCOUNTER — Ambulatory Visit: Payer: Medicare HMO

## 2023-04-22 ENCOUNTER — Ambulatory Visit: Payer: Medicare HMO | Admitting: Pulmonary Disease

## 2023-04-24 ENCOUNTER — Telehealth: Payer: Self-pay | Admitting: Pulmonary Disease

## 2023-04-24 NOTE — Telephone Encounter (Signed)
Pt wants a call back for when her results come in for her

## 2023-04-25 ENCOUNTER — Ambulatory Visit: Admission: RE | Admit: 2023-04-25 | Payer: Medicare HMO | Source: Ambulatory Visit

## 2023-04-25 DIAGNOSIS — R911 Solitary pulmonary nodule: Secondary | ICD-10-CM | POA: Insufficient documentation

## 2023-04-25 DIAGNOSIS — I2510 Atherosclerotic heart disease of native coronary artery without angina pectoris: Secondary | ICD-10-CM | POA: Diagnosis not present

## 2023-04-25 DIAGNOSIS — I700 Atherosclerosis of aorta: Secondary | ICD-10-CM | POA: Diagnosis not present

## 2023-04-25 DIAGNOSIS — R918 Other nonspecific abnormal finding of lung field: Secondary | ICD-10-CM | POA: Diagnosis not present

## 2023-04-25 LAB — GLUCOSE, CAPILLARY: Glucose-Capillary: 102 mg/dL — ABNORMAL HIGH (ref 70–99)

## 2023-04-25 MED ORDER — FLUDEOXYGLUCOSE F - 18 (FDG) INJECTION
5.2000 | Freq: Once | INTRAVENOUS | Status: AC
  Administered 2023-04-25: 5.44 via INTRAVENOUS

## 2023-05-07 ENCOUNTER — Encounter: Payer: Self-pay | Admitting: Pulmonary Disease

## 2023-05-07 ENCOUNTER — Ambulatory Visit: Payer: Medicare HMO | Admitting: Pulmonary Disease

## 2023-05-07 VITALS — BP 104/60 | HR 91 | Temp 98.0°F | Ht 64.0 in | Wt 97.0 lb

## 2023-05-07 DIAGNOSIS — R911 Solitary pulmonary nodule: Secondary | ICD-10-CM

## 2023-05-07 DIAGNOSIS — C3491 Malignant neoplasm of unspecified part of right bronchus or lung: Secondary | ICD-10-CM | POA: Diagnosis not present

## 2023-05-07 NOTE — Progress Notes (Signed)
Subjective:    Patient ID: [name], female    DOB: 04-21-1941, 82 y.o.   MRN: [id number]  Patient Care Team: [name], MD as PCP - General (Internal Medicine)  Chief Complaint  Patient presents with   Follow-up    PET Scan results. No SOB, wheezing or cough.    HPI Patient is an 82 year old lifelong never smoker with a history as noted below, who presents for follow-up of a lung nodule.  She was initially evaluated here on 18 March 2023 and at that time a PET/CT was ordered.  The patient's PET/CT was delayed due to mechanical fall on 22 July resulting in right femoral midshaft fracture which required repair.  She also developed A-fib and had to be placed on metoprolol and Eliquis.  She states that she is now off of Eliquis.  She had to go to a skilled nursing facility transiently for rehab.  She is still having issues with the hip healing slowly.  Recall that the nodule was noted on CT angio chest performed on 14 August 2022 during the patient's ED visit for chest pain.  A CT angio chest did not show any pulmonary emboli however it showed a lung nodule that was 9 x 10 mm in diameter.  In retrospect, reviewing films from 30 May 2020 the patient had had actually a 3 x 7 mm abnormality on this general area amid vascular structures.  The patient was initially evaluated by [facility] for this abnormality. She had a follow-up CT chest on 15 November 2022 that showed the nodule to be 10 x 10 mm in diameter.  Most recent CT chest was on 21 February 2023 as a short-term follow-up from the March imaging. The nodule appears stable from that measurement.  The patient underwent PET/CT on 25 April 2023 and this shows hypermetabolism on the right lower lobe nodule with an SUV max of 3.5, there is no activity in the mediastinal lymph nodes.  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  I showed all of the films to the patient and discussed options for next steps with  her.  With regards to this finding the patient is asymptomatic.  She has not had undue weight loss nor anorexia.  No cough or sputum production.  No hemoptysis.  She does have issues with gastroesophageal reflux that are chronic and takes PPI and H2 blocker for control.  She follows antireflux measures.  No other symptomatology.  We discussed the next steps with the patient.  She is not a surgical candidate due to her advanced age and frailty.  The patient would have to undergo general anesthesia for biopsy by robotic bronchoscopy and at present she is not interested in this.  We discussed empiric SBRT and this is more appealing to the patient.   Review of Systems A 10 point review of systems was performed and it is as noted above otherwise negative.   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   Essential hypertension 03/24/2023   Chronic mitral valve regurgitation 03/24/2023   Bilateral lower extremity edema 03/24/2023   GERD (gastroesophageal reflux disease) 02/11/2014   Dyslipidemia 03/06/2010   CAD, NATIVE VESSEL 03/06/2010    Social History   Tobacco Use   Smoking status: Never  Passive exposure: Past   Smokeless tobacco: Never  Substance Use Topics   Alcohol use: No    Allergies  Allergen Reactions   Bisphosphonates Other (See Comments)    Swallowing problems    Boniva [Ibandronic Acid] Other (See Comments)    Swallowing problems    Fosamax [Alendronate Sodium] Other (See Comments)    Swallowing problems    Lipitor [Atorvastatin] Other (See Comments)    Knee pain    Etodolac Rash    Current Meds  Medication Sig   acetaminophen (TYLENOL) 325 MG tablet Take 1-2 tablets (325-650 mg total) by mouth every 6 (six) hours as needed for mild pain (pain score  1-3 or temp > 100.5).   aspirin EC 81 MG tablet Take 81 mg by mouth once.   cholecalciferol (CHOLECALCIFEROL) 25 MCG tablet Take 2 tablets (2,000 Units total) by mouth daily.   famotidine (PEPCID) 40 MG tablet Take 40 mg by mouth at bedtime.   HYDROcodone-acetaminophen (NORCO) 7.5-325 MG tablet Take 1 tablet by mouth every 4 (four) hours as needed for severe pain.   lovastatin (MEVACOR) 40 MG tablet Take 40 mg by mouth at bedtime.   methocarbamol (ROBAXIN) 500 MG tablet Take 1 tablet (500 mg total) by mouth every 6 (six) hours as needed for muscle spasms.   metoprolol succinate (TOPROL-XL) 25 MG 24 hr tablet Take 1 tablet by mouth 2 (two) times daily.   Multiple Vitamin (MULTIVITAMIN) tablet Take 1 tablet by mouth daily.   omeprazole (PRILOSEC) 40 MG capsule Take 40 mg by mouth daily.   ondansetron (ZOFRAN-ODT) 4 MG disintegrating tablet Take 1 tablet (4 mg total) by mouth every 8 (eight) hours as needed for nausea or vomiting.   polyethylene glycol powder (MIRALAX) 17 GM/SCOOP powder Take 17 g by mouth 2 (two) times daily as needed for moderate constipation.   senna-docusate (SENOKOT-S) 8.6-50 MG tablet Take 1 tablet by mouth 2 (two) times daily between meals as needed for mild constipation.    Immunization History  Administered Date(s) Administered   Influenza, High Dose Seasonal PF 08/05/2016, 05/12/2017   Moderna Sars-Covid-2 Vaccination 09/14/2019, 10/12/2019, 06/19/2020, 07/16/2020   Pneumococcal Conjugate-13 06/04/2017   Pneumococcal Polysaccharide-23 01/02/2015   Zoster Recombinant(Shingrix) 02/19/2018, 02/20/2018, 05/04/2018   Zoster, Live 10/04/2012        Objective:     BP 104/60 (BP Location: Right Arm, Cuff Size: Normal)   Pulse 91   Temp 98 F (36.7 C)   Ht 5\' 4"  (1.626 m)   Wt 97 lb (44 kg)   SpO2 100%   BMI 16.65 kg/m   SpO2: 100 % O2 Device: None (Room air)  GENERAL: Thin, frail-appearing woman, no acute distress.  She presents in transport chair due to  right hip pain. HEAD: Normocephalic, atraumatic.  EYES: Pupils equal, round, reactive to light.  No scleral icterus.  MOUTH: Dentition intact. NECK: Supple. No thyromegaly. Trachea midline. No JVD.  No adenopathy. PULMONARY: Good air entry bilaterally.  No adventitious sounds. CARDIOVASCULAR: S1 and S2. Regular rate and rhythm.  ABDOMEN: Benign. MUSCULOSKELETAL: No joint deformity, no clubbing, no edema.  NEUROLOGIC: Grossly intact.  Gait not tested due to limitation in ambulation (right hip pain). SKIN: Intact,warm,dry. PSYCH: Mood and behavior normal.   Assessment & Plan:     ICD-10-CM   1. Lung nodule seen on imaging study RLL 10 X 10 mm, spiculated  R91.1 Ambulatory referral to Radiation Oncology   Hypermetabolic RLL nodule Likely indolent stage IA adenocarcinoma Patient not a surgical  candidate Patient does not want biopsy Prefers SBRT    2. Non-small cell cancer of right lung [facility])  C34.91 Ambulatory referral to Radiation Oncology   Suspect adenocarcinoma Stage IA by PET/CT      Orders Placed This Encounter  Procedures   Ambulatory referral to Radiation Oncology    Referral Priority:   Routine    Referral Type:   Consultation    Referral Reason:   Specialty Services Required    Requested Specialty:   Radiation Oncology    Number of Visits Requested:   1    After lengthy discussion with the patient.  She is leaning more towards empiric SBRT.  Will refer to radiation oncology for evaluation.  Will see the patient in follow-up in 3 months time she is to contact us prior to that time should any new difficulties arise.    Gailen Shelter, MD Advanced Bronchoscopy PCCM [facility]    *This note was dictated using voice recognition software/Dragon.  Despite best efforts to proofread, errors can occur which can change the meaning. Any transcriptional errors that result from this process are unintentional and may not be fully corrected at the time of  dictation.

## 2023-05-07 NOTE — Patient Instructions (Signed)
We discussed the findings of your PET/CT today.  I recommend empiric SBRT (radiation) to the spot in your lung.  We will refer you to Dr. Rushie Chestnut in the cancer center.  We will see him in follow-up in 3 months time call sooner should any new problems arise.

## 2023-05-12 ENCOUNTER — Ambulatory Visit: Payer: Medicare HMO | Admitting: Radiation Oncology

## 2023-05-15 ENCOUNTER — Ambulatory Visit
Admission: RE | Admit: 2023-05-15 | Discharge: 2023-05-15 | Disposition: A | Discharge status: Home / Self Care | Payer: Medicare HMO | Source: Ambulatory Visit | Attending: Radiation Oncology | Admitting: Radiation Oncology

## 2023-05-15 VITALS — BP 122/62 | HR 78 | Temp 98.3°F | Ht 63.0 in | Wt 98.2 lb

## 2023-05-15 DIAGNOSIS — R911 Solitary pulmonary nodule: Secondary | ICD-10-CM

## 2023-05-15 NOTE — Consult Note (Signed)
NEW PATIENT EVALUATION  Name: [name]  MRN: [id number]  Date:   05/15/2023     DOB: 05/31/1941   This 82 y.o. female patient presents to the clinic for initial evaluation of stage Ia non-small cell lung cancer of the right lower lobe.  REFERRING PHYSICIAN: [name], MD  CHIEF COMPLAINT:  Chief Complaint  Patient presents with   Lung Cancer    Consult    DIAGNOSIS: The encounter diagnosis was Solitary lung nodule.   PREVIOUS INVESTIGATIONS:  Serial CT scans and PET scan reviewed Labs reviewed Clinical notes reviewed  HPI: Patient is a an 82 year old female followed for a right lower lobe spiculated lesion on serial CT scans which is increasing over time.  She had a recent PET CT scan showing hypermetabolic activity in the 8 mm right lower lobe nodule worry some for indolent stage I adenocarcinoma.  She is referred for Dr. Jayme Cloud for empiric treatment.  She is asymptomatic specifically denies cough hemoptysis or chest tightness.  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    PAST SURGICAL HISTORY:  Past Surgical History:  Procedure Laterality Date   ABDOMINAL HYSTERECTOMY     partial   CARDIAC CATHETERIZATION     [facility]   cataract     COLONOSCOPY WITH PROPOFOL N/A 06/22/2015   Procedure: COLONOSCOPY WITH PROPOFOL;  Surgeon: Wallace Cullens, MD;  Location: [facility] ENDOSCOPY;  Service: Gastroenterology;  Laterality: N/A;   ESOPHAGOGASTRODUODENOSCOPY (EGD) WITH PROPOFOL N/A 06/22/2015   Procedure: ESOPHAGOGASTRODUODENOSCOPY (EGD) WITH PROPOFOL;  Surgeon: Wallace Cullens, MD;  Location: [facility] ENDOSCOPY;  Service: Gastroenterology;   Laterality: N/A;   ESOPHAGOGASTRODUODENOSCOPY (EGD) WITH PROPOFOL N/A 07/12/2020   Procedure: ESOPHAGOGASTRODUODENOSCOPY (EGD) WITH PROPOFOL;  Surgeon: Toledo, Boykin Nearing, MD;  Location: [facility] ENDOSCOPY;  Service: Gastroenterology;  Laterality: N/A;   INTRAMEDULLARY (IM) NAIL INTERTROCHANTERIC Right 03/24/2023   Procedure: INTRAMEDULLARY (IM) NAIL FEMUR;  Surgeon: Roby Lofts, MD;  Location: [facility] OR;  Service: Orthopedics;  Laterality: Right;   PARTIAL HYSTERECTOMY     TONSILLECTOMY      FAMILY HISTORY: family history includes Breast cancer (age of onset: 73) in her maternal aunt; Coronary artery disease in an other family member; Heart disease in her father; Heart failure in her father and mother; Transient ischemic attack in her mother.  SOCIAL HISTORY:  reports that she has never smoked. She has been exposed to tobacco smoke. She has never used smokeless tobacco. She reports that she does not drink alcohol and does not use drugs.  ALLERGIES: Bisphosphonates, Boniva [ibandronic acid], Fosamax [alendronate sodium], Lipitor [atorvastatin], and Etodolac  MEDICATIONS:  Current Outpatient Medications  Medication Sig Dispense Refill   acetaminophen (TYLENOL) 325 MG tablet Take 1-2 tablets (325-650 mg total) by mouth every 6 (six) hours as needed for mild pain (pain score 1-3 or temp > 100.5).     aspirin EC 81 MG tablet Take 81 mg by mouth once.     cholecalciferol (CHOLECALCIFEROL) 25 MCG tablet Take 2 tablets (2,000 Units total) by mouth daily. 60 tablet 1   Denosumab (PROLIA Bondurant) Inject 1 Dose into the skin every 6 (six) months. (Patient not taking: Reported on 05/07/2023)     famotidine (PEPCID) 40 MG tablet Take 40 mg by mouth  at bedtime.     furosemide (LASIX) 40 MG tablet Take 1 tablet (40 mg total) by mouth daily. (Patient not taking: Reported on 05/07/2023)     HYDROcodone-acetaminophen (NORCO) 7.5-325 MG tablet Take 1 tablet by mouth every 4 (four) hours as needed for severe pain. 42 tablet 0    lovastatin (MEVACOR) 40 MG tablet Take 40 mg by mouth at bedtime.     methocarbamol (ROBAXIN) 500 MG tablet Take 1 tablet (500 mg total) by mouth every 6 (six) hours as needed for muscle spasms. 20 tablet 0   metoprolol succinate (TOPROL-XL) 25 MG 24 hr tablet Take 1 tablet by mouth 2 (two) times daily.     midodrine (PROAMATINE) 5 MG tablet Take 3 tablets (15 mg total) by mouth 3 (three) times daily with meals. (Patient not taking: Reported on 05/07/2023)     Multiple Vitamin (MULTIVITAMIN) tablet Take 1 tablet by mouth daily.     omeprazole (PRILOSEC) 40 MG capsule Take 40 mg by mouth daily.     ondansetron (ZOFRAN-ODT) 4 MG disintegrating tablet Take 1 tablet (4 mg total) by mouth every 8 (eight) hours as needed for nausea or vomiting. 20 tablet 0   polyethylene glycol powder (MIRALAX) 17 GM/SCOOP powder Take 17 g by mouth 2 (two) times daily as needed for moderate constipation.     senna-docusate (SENOKOT-S) 8.6-50 MG tablet Take 1 tablet by mouth 2 (two) times daily between meals as needed for mild constipation.     triamcinolone (KENALOG) 0.025 % ointment Apply 1 Application topically 2 (two) times daily. For 3-5 days. (Patient not taking: Reported on 05/07/2023) 30 g 0   No current facility-administered medications for this encounter.    ECOG PERFORMANCE STATUS:  0 - Asymptomatic  REVIEW OF SYSTEMS: Patient denies any weight loss, fatigue, weakness, fever, chills or night sweats. Patient denies any loss of vision, blurred vision. Patient denies any ringing  of the ears or hearing loss. No irregular heartbeat. Patient denies heart murmur or history of fainting. Patient denies any chest pain or pain radiating to her upper extremities. Patient denies any shortness of breath, difficulty breathing at night, cough or hemoptysis. Patient denies any swelling in the lower legs. Patient denies any nausea vomiting, vomiting of blood, or coffee ground material in the vomitus. Patient denies any stomach  pain. Patient states has had normal bowel movements no significant constipation or diarrhea. Patient denies any dysuria, hematuria or significant nocturia. Patient denies any problems walking, swelling in the joints or loss of balance. Patient denies any skin changes, loss of hair or loss of weight. Patient denies any excessive worrying or anxiety or significant depression. Patient denies any problems with insomnia. Patient denies excessive thirst, polyuria, polydipsia. Patient denies any swollen glands, patient denies easy bruising or easy bleeding. Patient denies any recent infections, allergies or URI. Patient "s visual fields have not changed significantly in recent time.   PHYSICAL EXAM: BP 122/62   Pulse 78   Temp 98.3 F (36.8 C)   Ht 5\' 3"  (1.6 m)   Wt 98 lb 3.2 oz (44.5 kg)   SpO2 100%   BMI 17.40 kg/m  Well-developed well-nourished patient in NAD. HEENT reveals PERLA, EOMI, discs not visualized.  Oral cavity is clear. No oral mucosal lesions are identified. Neck is clear without evidence of cervical or supraclavicular adenopathy. Lungs are clear to A&P. Cardiac examination is essentially unremarkable with regular rate and rhythm without murmur rub or thrill. Abdomen is benign with no organomegaly  or masses noted. Motor sensory and DTR levels are equal and symmetric in the upper and lower extremities. Cranial nerves II through XII are grossly intact. Proprioception is intact. No peripheral adenopathy or edema is identified. No motor or sensory levels are noted. Crude visual fields are within normal range.  LABORATORY DATA: Labs reviewed    RADIOLOGY RESULTS: Serial CT scan and PET CT scan reviewed compatible with above-stated findings   IMPRESSION: Stage Ia non-small cell lung cancer of the right lower lobe in 82 year old female  PLAN: This time I recommended SBRT to her right lower lobe.  Would plan on delivering 60 Gray in 5 fractions.  Risks and benefits of treatment including  extremely low side effect profile of SBRT.  She may develop a cough about a month out after treatment.  I have personally set up and ordered CT simulation for next week.  Patient comprehends my recommendations well.  I would like to take this opportunity to thank you for allowing me to participate in the care of your patient.[name], MD

## 2023-05-19 ENCOUNTER — Ambulatory Visit
Admission: RE | Admit: 2023-05-19 | Discharge: 2023-05-19 | Disposition: A | Discharge status: Home / Self Care | Payer: Medicare HMO | Source: Ambulatory Visit | Attending: Radiation Oncology | Admitting: Radiation Oncology

## 2023-05-19 DIAGNOSIS — Z510 Encounter for antineoplastic radiation therapy: Secondary | ICD-10-CM | POA: Diagnosis present

## 2023-05-19 DIAGNOSIS — C3411 Malignant neoplasm of upper lobe, right bronchus or lung: Secondary | ICD-10-CM | POA: Diagnosis not present

## 2023-05-19 DIAGNOSIS — C3431 Malignant neoplasm of lower lobe, right bronchus or lung: Secondary | ICD-10-CM | POA: Diagnosis present

## 2023-05-21 DIAGNOSIS — Z510 Encounter for antineoplastic radiation therapy: Secondary | ICD-10-CM | POA: Diagnosis not present

## 2023-05-26 NOTE — Progress Notes (Unsigned)
Cardiology Office Note  Date:  05/27/2023   ID:  Daniel, Beth [birth year], MRN [id number]  PCP:  [name], MD   Chief Complaint  Patient presents with   New Patient (Initial Visit)    Ref by Bethann Punches, MD for A-Fib. "Doing well."  Medications reviewed by the patient verbally.     HPI:  [name] is a 82 year old woman with past medical history of Paroxysmal atrial fibrillation Coronary artery disease Essential hypertension Hyperlipidemia Echo July 2023 severe TR with mildly elevated right heart pressures mechanical fall and was found to have R midshaft femoral fracture.  Underwent intramedullary nailing on 03/24/2023.  Hospital course complicated by new onset A-fib with RVR and hyponatremia.  Who presents by referral from Dr. Hyacinth Meeker for atrial fibrillation  Recent events discussed Recent hospitalization after fall 7/22/ 2024 Fracture femur, required surgery July 22 Developed atrial fibrillation later in hospitalization, EKG documenting July 29 with A-fib rate 130s Discharged on Eliquis, metoprolol, rate controlled Low sodium noted throughout her hospital course  Has completed rehab 2 weeks Home PT, uses a walker, gait improving, no recent falls  Echo March 31, 2023 EF 50% mild MR trivial TR  Prior strong family history of stroke Reports that she stopped the Eliquis 2.5 twice daily when prescription ran out Reduced dose Eliquis for age over 61 and weight less than 60 kg  CHADS VASC 5  EKG personally reviewed by myself on todays visit EKG Interpretation Date/Time:  Tuesday May 27 2023 15:49:57 EDT Ventricular Rate:  71 PR Interval:  136 QRS Duration:  86 QT Interval:  396 QTC Calculation: 430 R Axis:   28  Text Interpretation: Normal sinus rhythm Normal ECG When compared with ECG of 31-Mar-2023 10:13, Sinus rhythm has replaced Atrial fibrillation Vent. rate has decreased BY  66 BPM Confirmed by Julien Nordmann [phone]) on 05/27/2023 4:12:11  PM   Previously seen by cardiology in 2022  Prior ejection fraction 45 to 50% By report prior stress test with no ischemia 2022  Repeat echocardiogram April 01, 2022 Normal left and right ventricular function  Currently not on eliquis Walks with walker  Mother with afib, CVA Sister with mini CVA  PMH:   has a past medical history of Actinic keratosis (11/10/2019), Complication of anesthesia, Essential hypertension (03/24/2023), GERD (gastroesophageal reflux disease), History of actinic keratosis (06/15/2020), History of osteoporosis, Hyperlipidemia, PONV (postoperative nausea and vomiting), and 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  PSH:    Past Surgical History:  Procedure Laterality Date   ABDOMINAL HYSTERECTOMY     partial   CARDIAC CATHETERIZATION     [facility]   cataract     COLONOSCOPY WITH PROPOFOL N/A 06/22/2015   Procedure: COLONOSCOPY WITH PROPOFOL;  Surgeon: Wallace Cullens, MD;  Location[facility] ENDOSCOPY;  Service: Gastroenterology;  Laterality: N/A;   ESOPHAGOGASTRODUODENOSCOPY (EGD) WITH PROPOFOL N/A 06/22/2015   Procedure: ESOPHAGOGASTRODUODENOSCOPY (EGD) WITH PROPOFOL;  Surgeon: Wallace Cullens, MD;  Location: [facility] ENDOSCOPY;  Service: Gastroenterology;  Laterality: N/A;   ESOPHAGOGASTRODUODENOSCOPY (EGD) WITH PROPOFOL N/A 07/12/2020   Procedure: ESOPHAGOGASTRODUODENOSCOPY (EGD) WITH PROPOFOL;  Surgeon: Toledo, Boykin Nearing, MD;  Location: [facility] ENDOSCOPY;  Service: Gastroenterology;  Laterality: N/A;   INTRAMEDULLARY (IM) NAIL INTERTROCHANTERIC Right 03/24/2023   Procedure: INTRAMEDULLARY (IM) NAIL FEMUR;  Surgeon: Roby Lofts, MD;  Location: [facility] OR;  Service: Orthopedics;  Laterality: Right;   PARTIAL HYSTERECTOMY     TONSILLECTOMY      Current Outpatient Medications  Medication Sig Dispense  Refill   acetaminophen (TYLENOL) 325 MG tablet Take 1-2 tablets (325-650 mg total) by mouth every 6 (six) hours as needed for mild pain (pain score 1-3 or temp > 100.5).     aspirin  EC 81 MG tablet Take 81 mg by mouth once.     Denosumab (PROLIA ) Inject 1 Dose into the skin every 6 (six) months.     famotidine (PEPCID) 40 MG tablet Take 40 mg by mouth at bedtime.     HYDROcodone-acetaminophen (NORCO) 7.5-325 MG tablet Take 1 tablet by mouth every 4 (four) hours as needed for severe pain. 42 tablet 0   lovastatin (MEVACOR) 40 MG tablet Take 40 mg by mouth at bedtime.     methocarbamol (ROBAXIN) 500 MG tablet Take 1 tablet (500 mg total) by mouth every 6 (six) hours as needed for muscle spasms. 20 tablet 0   metoprolol succinate (TOPROL-XL) 25 MG 24 hr tablet Take 1 tablet by mouth 2 (two) times daily.     Multiple Vitamin (MULTIVITAMIN) tablet Take 1 tablet by mouth daily.     omeprazole (PRILOSEC) 40 MG capsule Take 40 mg by mouth daily.     ondansetron (ZOFRAN-ODT) 4 MG disintegrating tablet Take 1 tablet (4 mg total) by mouth every 8 (eight) hours as needed for nausea or vomiting. 20 tablet 0   polyethylene glycol powder (MIRALAX) 17 GM/SCOOP powder Take 17 g by mouth 2 (two) times daily as needed for moderate constipation.     senna-docusate (SENOKOT-S) 8.6-50 MG tablet Take 1 tablet by mouth 2 (two) times daily between meals as needed for mild constipation.     triamcinolone (KENALOG) 0.025 % ointment Apply 1 Application topically 2 (two) times daily. For 3-5 days. 30 g 0   furosemide (LASIX) 40 MG tablet Take 1 tablet (40 mg total) by mouth daily. (Patient not taking: Reported on 05/07/2023)     midodrine (PROAMATINE) 5 MG tablet Take 3 tablets (15 mg total) by mouth 3 (three) times daily with meals. (Patient not taking: Reported on 05/07/2023)     No current facility-administered medications for this visit.     Allergies:   Bisphosphonates, Boniva [ibandronic acid], Fosamax [alendronate sodium], Lipitor [atorvastatin], and Etodolac   Social History:  The patient  reports that she has never smoked. She has been exposed to tobacco smoke. She has never used smokeless  tobacco. She reports that she does not drink alcohol and does not use drugs.   Family History:   family history includes Breast cancer (age of onset: 62) in her maternal aunt; Coronary artery disease in an other family member; Heart disease in her father; Heart failure in her father and mother; Transient ischemic attack in her mother.    Review of Systems: Review of Systems  Constitutional: Negative.   HENT: Negative.    Respiratory: Negative.    Cardiovascular: Negative.   Gastrointestinal: Negative.   Musculoskeletal: Negative.   Neurological: Negative.   Psychiatric/Behavioral: Negative.    All other systems reviewed and are negative.    PHYSICAL EXAM: VS:  BP 120/60 (BP Location: Right Arm, Patient Position: Sitting, Cuff Size: Normal)   Pulse 71   Ht 5\' 3"  (1.6 m)   Wt 97 lb 8 oz (44.2 kg)   SpO2 97%   BMI 17.27 kg/m  , BMI Body mass index is 17.27 kg/m. GEN: Well nourished, well developed, in no acute distress HEENT: normal Neck: no JVD, carotid bruits, or masses Cardiac: RRR; no murmurs, rubs, or gallops,no  edema  Respiratory:  clear to auscultation bilaterally, normal work of breathing GI: soft, nontender, nondistended, + BS MS: no deformity or atrophy Skin: warm and dry, no rash Neuro:  Strength and sensation are intact Psych: euthymic mood, full affect   Recent Labs: 03/31/2023: TSH 1.573 04/03/2023: ALT 18 04/04/2023: BUN 41; Creatinine, Ser 0.63; Hemoglobin 10.4; Magnesium 1.7; Platelets 475; Potassium 3.5; Sodium 129    Lipid Panel No results found for: "CHOL", "HDL", "LDLCALC", "TRIG"    Wt Readings from Last 3 Encounters:  05/27/23 97 lb 8 oz (44.2 kg)  05/15/23 98 lb 3.2 oz (44.5 kg)  05/07/23 97 lb (44 kg)       ASSESSMENT AND PLAN:  Problem List Items Addressed This Visit   None Visit Diagnoses     Persistent atrial fibrillation (HCC)    -  Primary   Relevant Orders   EKG 12-Lead (Completed)      Paroxysmal atrial  fibrillation Documented on hospitalization July 2024, asymptomatic CHADS VASC 5 Very strong family history of stroke, both sisters, mother Recommend she restart Eliquis 2.5 twice daily Balance is reasonable, no recent falls Continue metoprolol succinate 25 twice daily  Essential hypertension Blood pressure is well controlled on today's visit. No changes made to the medications.  Hyperlipidemia On lovastatin 40 daily Total cholesterol 180 LDL 96 Could consider adding Zetia to achieve goal LDL less than 70  Aortic atherosclerosis Noted on CT scan June 2024, mild to moderate Goal LDL less than 70  Coronary calcification Noted on CT scan June 2024 Denies anginal symptoms, no further ischemic workup needed Aggressive lipid management recommended   Total encounter time more than 60 minutes  Greater than 50% was spent in counseling and coordination of care with the patient    Signed, Dossie Arbour, M.D., Ph.D. [facility] [place], [place] [phone]

## 2023-05-27 ENCOUNTER — Encounter: Payer: Self-pay | Admitting: Cardiovascular Disease

## 2023-05-27 ENCOUNTER — Ambulatory Visit: Payer: Medicare HMO | Attending: Cardiovascular Disease | Admitting: Cardiovascular Disease

## 2023-05-27 VITALS — BP 120/60 | HR 71 | Ht 63.0 in | Wt 97.5 lb

## 2023-05-27 DIAGNOSIS — I10 Essential (primary) hypertension: Secondary | ICD-10-CM

## 2023-05-27 DIAGNOSIS — I700 Atherosclerosis of aorta: Secondary | ICD-10-CM

## 2023-05-27 DIAGNOSIS — I4819 Other persistent atrial fibrillation: Secondary | ICD-10-CM

## 2023-05-27 DIAGNOSIS — E782 Mixed hyperlipidemia: Secondary | ICD-10-CM

## 2023-05-27 DIAGNOSIS — I2510 Atherosclerotic heart disease of native coronary artery without angina pectoris: Secondary | ICD-10-CM

## 2023-05-27 DIAGNOSIS — I2584 Coronary atherosclerosis due to calcified coronary lesion: Secondary | ICD-10-CM

## 2023-05-27 MED ORDER — APIXABAN 2.5 MG PO TABS: 2.5000 mg | ORAL_TABLET | Freq: Two times a day (BID) | ORAL | 6 refills | Status: DC

## 2023-05-27 NOTE — Patient Instructions (Addendum)
Medication Instructions:  Please start eliquis 2.5 twice a day Stop aspirin  If you need a refill on your cardiac medications before your next appointment, please call your pharmacy.   Lab work: No new labs needed  Testing/Procedures: No new testing needed  Follow-Up: At [facility], you and your health needs are our priority.  As part of our continuing mission to provide you with exceptional heart care, we have created designated Provider Care Teams.  These Care Teams include your primary Cardiologist (physician) and Advanced Practice Providers (APPs -  Physician Assistants and Nurse Practitioners) who all work together to provide you with the care you need, when you need it.  You will need a follow up appointment in 6 months  Providers on your designated Care Team:   Nicolasa Ducking, NP Eula Listen, PA-C Cadence Fransico Michael, [place]  COVID-19 Vaccine Information can be found at: [email or web address] For questions related to vaccine distribution or appointments, please email [email or web address] or call 8544520562.

## 2023-05-29 ENCOUNTER — Ambulatory Visit
Admission: RE | Admit: 2023-05-29 | Discharge: 2023-05-29 | Disposition: A | Discharge status: Home / Self Care | Payer: Medicare HMO | Source: Ambulatory Visit | Attending: Radiation Oncology | Admitting: Radiation Oncology

## 2023-05-29 ENCOUNTER — Other Ambulatory Visit: Payer: Self-pay

## 2023-05-29 DIAGNOSIS — Z510 Encounter for antineoplastic radiation therapy: Secondary | ICD-10-CM | POA: Diagnosis not present

## 2023-05-29 LAB — RAD ONC ARIA SESSION SUMMARY
Course Elapsed Days: 0
Plan Fractions Treated to Date: 1
Plan Prescribed Dose Per Fraction: 12 Gy
Plan Total Fractions Prescribed: 5
Plan Total Prescribed Dose: 60 Gy
Reference Point Dosage Given to Date: 12 Gy
Reference Point Session Dosage Given: 12 Gy
Session Number: 1

## 2023-06-03 ENCOUNTER — Ambulatory Visit
Admission: RE | Admit: 2023-06-03 | Discharge: 2023-06-03 | Disposition: A | Discharge status: Home / Self Care | Payer: Medicare HMO | Source: Ambulatory Visit | Attending: Radiation Oncology | Admitting: Radiation Oncology

## 2023-06-03 ENCOUNTER — Other Ambulatory Visit: Payer: Self-pay

## 2023-06-03 DIAGNOSIS — Z510 Encounter for antineoplastic radiation therapy: Secondary | ICD-10-CM | POA: Insufficient documentation

## 2023-06-03 DIAGNOSIS — C3411 Malignant neoplasm of upper lobe, right bronchus or lung: Secondary | ICD-10-CM | POA: Insufficient documentation

## 2023-06-03 DIAGNOSIS — C3431 Malignant neoplasm of lower lobe, right bronchus or lung: Secondary | ICD-10-CM | POA: Diagnosis present

## 2023-06-03 LAB — RAD ONC ARIA SESSION SUMMARY
Course Elapsed Days: 5
Plan Fractions Treated to Date: 2
Plan Prescribed Dose Per Fraction: 12 Gy
Plan Total Fractions Prescribed: 5
Plan Total Prescribed Dose: 60 Gy
Reference Point Dosage Given to Date: 24 Gy
Reference Point Session Dosage Given: 12 Gy
Session Number: 2

## 2023-06-05 ENCOUNTER — Ambulatory Visit
Admission: RE | Admit: 2023-06-05 | Discharge: 2023-06-05 | Disposition: A | Discharge status: Home / Self Care | Payer: Medicare HMO | Source: Ambulatory Visit | Attending: Radiation Oncology | Admitting: Radiation Oncology

## 2023-06-05 ENCOUNTER — Ambulatory Visit: Payer: Medicare HMO

## 2023-06-05 ENCOUNTER — Ambulatory Visit: Admission: RE | Admit: 2023-06-05 | Payer: Medicare HMO | Source: Ambulatory Visit

## 2023-06-05 ENCOUNTER — Other Ambulatory Visit: Payer: Self-pay

## 2023-06-05 DIAGNOSIS — Z510 Encounter for antineoplastic radiation therapy: Secondary | ICD-10-CM | POA: Diagnosis not present

## 2023-06-05 LAB — RAD ONC ARIA SESSION SUMMARY
Course Elapsed Days: 7
Plan Fractions Treated to Date: 3
Plan Prescribed Dose Per Fraction: 12 Gy
Plan Total Fractions Prescribed: 5
Plan Total Prescribed Dose: 60 Gy
Reference Point Dosage Given to Date: 36 Gy
Reference Point Session Dosage Given: 12 Gy
Session Number: 3

## 2023-06-10 ENCOUNTER — Ambulatory Visit
Admission: RE | Admit: 2023-06-10 | Discharge: 2023-06-10 | Disposition: A | Discharge status: Home / Self Care | Payer: Medicare HMO | Source: Ambulatory Visit | Attending: Radiation Oncology | Admitting: Radiation Oncology

## 2023-06-10 ENCOUNTER — Other Ambulatory Visit: Payer: Self-pay

## 2023-06-10 DIAGNOSIS — Z510 Encounter for antineoplastic radiation therapy: Secondary | ICD-10-CM | POA: Diagnosis not present

## 2023-06-10 LAB — RAD ONC ARIA SESSION SUMMARY
Course Elapsed Days: 12
Plan Fractions Treated to Date: 4
Plan Prescribed Dose Per Fraction: 12 Gy
Plan Total Fractions Prescribed: 5
Plan Total Prescribed Dose: 60 Gy
Reference Point Dosage Given to Date: 48 Gy
Reference Point Session Dosage Given: 12 Gy
Session Number: 4

## 2023-06-12 ENCOUNTER — Ambulatory Visit
Admission: RE | Admit: 2023-06-12 | Discharge: 2023-06-12 | Disposition: A | Discharge status: Home / Self Care | Payer: Medicare HMO | Source: Ambulatory Visit | Attending: Radiation Oncology | Admitting: Radiation Oncology

## 2023-06-12 ENCOUNTER — Other Ambulatory Visit: Payer: Self-pay

## 2023-06-12 DIAGNOSIS — Z510 Encounter for antineoplastic radiation therapy: Secondary | ICD-10-CM | POA: Diagnosis not present

## 2023-06-12 LAB — RAD ONC ARIA SESSION SUMMARY
Course Elapsed Days: 14
Plan Fractions Treated to Date: 5
Plan Prescribed Dose Per Fraction: 12 Gy
Plan Total Fractions Prescribed: 5
Plan Total Prescribed Dose: 60 Gy
Reference Point Dosage Given to Date: 60 Gy
Reference Point Session Dosage Given: 12 Gy
Session Number: 5

## 2023-06-16 ENCOUNTER — Ambulatory Visit: Payer: Medicare HMO

## 2023-06-24 ENCOUNTER — Ambulatory Visit: Payer: Medicare HMO | Admitting: Cardiovascular Disease

## 2023-07-10 ENCOUNTER — Ambulatory Visit: Payer: Medicare HMO | Admitting: Dermatology

## 2023-07-21 ENCOUNTER — Ambulatory Visit (INDEPENDENT_AMBULATORY_CARE_PROVIDER_SITE_OTHER): Payer: Medicare HMO | Admitting: Dermatology

## 2023-07-21 ENCOUNTER — Encounter: Payer: Self-pay | Admitting: Dermatology

## 2023-07-21 DIAGNOSIS — L570 Actinic keratosis: Secondary | ICD-10-CM

## 2023-07-21 DIAGNOSIS — L821 Other seborrheic keratosis: Secondary | ICD-10-CM | POA: Diagnosis not present

## 2023-07-21 DIAGNOSIS — Z85828 Personal history of other malignant neoplasm of skin: Secondary | ICD-10-CM

## 2023-07-21 DIAGNOSIS — L578 Other skin changes due to chronic exposure to nonionizing radiation: Secondary | ICD-10-CM

## 2023-07-21 DIAGNOSIS — L720 Epidermal cyst: Secondary | ICD-10-CM

## 2023-07-21 DIAGNOSIS — W908XXA Exposure to other nonionizing radiation, initial encounter: Secondary | ICD-10-CM

## 2023-07-21 NOTE — Progress Notes (Signed)
   Follow-Up Visit   Subjective  [name] is a 82 y.o. female who presents for the following: Irregular skin lesions to be checked on the R hairline, R ear, L med canthus and L face at Mohs surgery site. Pt concerned and would like them checked today.  The patient has spots, moles and lesions to be evaluated, some may be new or changing and the patient may have concern these could be cancer.   The following portions of the chart were reviewed this encounter and updated as appropriate: medications, allergies, medical history  Review of Systems:  No other skin or systemic complaints except as noted in HPI or Assessment and Plan.  Objective  Well appearing patient in no apparent distress; mood and affect are within normal limits.   A focused examination was performed of the following areas: the face and ears   Relevant exam findings are noted in the Assessment and Plan. R eyebrow Erythematous thin papules/macules with gritty scale.     Assessment & Plan   SEBORRHEIC KERATOSIS - R hair line, R ear - Stuck-on, waxy, tan-brown papules and/or plaques  - Benign-appearing - Discussed benign etiology and prognosis. - Observe - Call for any changes  ACTINIC DAMAGE - chronic, secondary to cumulative UV radiation exposure/sun exposure over time - diffuse scaly erythematous macules with underlying dyspigmentation - Recommend daily broad spectrum sunscreen SPF 30+ to sun-exposed areas, reapply every 2 hours as needed.  - Recommend staying in the shade or wearing long sleeves, sun glasses (UVA+UVB protection) and wide brim hats (4-inch brim around the entire circumference of the hat). - Call for new or changing lesions.  HISTORY OF SQUAMOUS CELL CARCINOMA IN SITE OF THE SKIN - L cheek - No evidence of recurrence today - Recommend regular full body skin exams - Recommend daily broad spectrum sunscreen SPF 30+ to sun-exposed areas, reapply every 2 hours as needed.  - Call if any  new or changing lesions are noted between office visits  Milia - Face - tiny firm white papule on left medial orbital rim - type of cyst - benign - may be extracted if symptomatic - observe  AK (actinic keratosis) R eyebrow  Patient deferred treatment at this time. Will recheck at [facility].   Actinic keratoses are precancerous spots that appear secondary to cumulative UV radiation exposure/sun exposure over time. They are chronic with expected duration over 1 year. A portion of actinic keratoses will progress to squamous cell carcinoma of the skin. It is not possible to reliably predict which spots will progress to skin cancer and so treatment is recommended to prevent development of skin cancer.  Recommend daily broad spectrum sunscreen SPF 30+ to sun-exposed areas, reapply every 2 hours as needed.  Recommend staying in the shade or wearing long sleeves, sun glasses (UVA+UVB protection) and wide brim hats (4-inch brim around the entire circumference of the hat). Call for new or changing lesions.     Return for [facility].  Maylene Roes, CMA, am acting as scribe for Elie Goody, MD .   Documentation: I have reviewed the above documentation for accuracy and completeness, and I agree with the above.  Elie Goody, MD

## 2023-07-21 NOTE — Patient Instructions (Signed)

## 2023-07-22 ENCOUNTER — Other Ambulatory Visit: Payer: Self-pay

## 2023-07-22 ENCOUNTER — Encounter: Payer: Self-pay | Admitting: Emergency Medicine

## 2023-07-22 ENCOUNTER — Emergency Department
Admission: EM | Admit: 2023-07-22 | Discharge: 2023-07-22 | Disposition: A | Discharge status: Home / Self Care | Payer: Medicare HMO | Attending: Emergency Medicine | Admitting: Emergency Medicine

## 2023-07-22 DIAGNOSIS — K208 Other esophagitis without bleeding: Secondary | ICD-10-CM | POA: Diagnosis not present

## 2023-07-22 DIAGNOSIS — T50905A Adverse effect of unspecified drugs, medicaments and biological substances, initial encounter: Secondary | ICD-10-CM

## 2023-07-22 DIAGNOSIS — R09A2 Foreign body sensation, throat: Secondary | ICD-10-CM | POA: Diagnosis present

## 2023-07-22 MED ORDER — GLUCAGON HCL (RDNA) 1 MG IJ SOLR
1.0000 mg | Freq: Once | INTRAMUSCULAR | Status: DC
Start: 2023-07-22 — End: 2023-07-22

## 2023-07-22 MED ORDER — GLUCAGON HCL RDNA (DIAGNOSTIC) 1 MG IJ SOLR
1.0000 mg | Freq: Once | INTRAMUSCULAR | Status: AC
  Administered 2023-07-22: 1 mg via INTRAMUSCULAR
  Filled 2023-07-22 (×2): qty 1

## 2023-07-22 NOTE — ED Triage Notes (Signed)
STAtes that last night got a pill lodged in esophagas. Still feels that something is there  AAOx3.  Skin warm and dry. NAD.  Managing oral secretions. Voice clear and strong. Respirations regular and non labored.

## 2023-07-22 NOTE — ED Provider Notes (Signed)
[facility] Emergency Department Provider Note     Event Date/Time   First MD Initiated Contact with Patient 07/22/23 1106     (approximate)   History   No chief complaint on file.   HPI  [name] is a 82 y.o. female with a history of HLD, osteoporosis, and GERD presents to the ED with complaint of possible pill lodged in throat.  Patient reports last night she was taking pills and feels like the pill was stuck in her throat.  She followed this with a cereal bar and believes this also remains in her throat.  She denies difficulty swallowing.  She is able to tolerate her secretions.  He denies chest pain, shortness of breath and vomiting.       Physical Exam   Triage Vital Signs: ED Triage Vitals [07/22/23 1028]  Encounter Vitals Group     BP (!) 120/59     Systolic BP Percentile      Diastolic BP Percentile      Pulse Rate 79     Resp 18     Temp 97.8 F (36.6 C)     Temp Source Oral     SpO2 99 %     Weight 97 lb 7.1 oz (44.2 kg)     Height 5\' 3"  (1.6 m)     Head Circumference      Peak Flow      Pain Score 0     Pain Loc      Pain Education      Exclude from Growth Chart     Most recent vital signs: Vitals:   07/22/23 1028  BP: (!) 120/59  Pulse: 79  Resp: 18  Temp: 97.8 F (36.6 C)  SpO2: 99%    General: Well appearing. Alert and oriented. INAD.  Normal speech.  Tolerating oral secretions. Skin:  Warm, dry and intact. No rashes or lesions noted.     Head:  NCAT.  Eyes:  PERRLA. EOMI.  Nose:   Mucosa is moist. No rhinorrhea. Throat: Oropharynx clear. No erythema or exudates. Tonsils not enlarged. Uvula is midline. Neck:   Full ROM without difficulty.  CV:  Good peripheral perfusion. RRR.  RESP:  Normal effort. LCTAB. No retractions.  No stridor  ED Results / Procedures / Treatments   Labs (all labs ordered are listed, but only abnormal results are displayed) Labs Reviewed - No data to display  No results  found.  PROCEDURES:  Critical Care performed: No  Procedures   MEDICATIONS ORDERED IN ED: Medications  glucagon (human recombinant) (GLUCAGEN) injection 1 mg (1 mg Intramuscular Given 07/22/23 1240)     IMPRESSION / MDM / ASSESSMENT AND PLAN / ED COURSE  I reviewed the triage vital signs and the nursing notes.                              Clinical Course as of 07/22/23 1331  Tue Jul 22, 2023  1302 Patient is tolerating fluids and eating crackers.  She reports improvement and is able to pass food. [MH]    Clinical Course User Index [MH] Kern Reap A, PA-C   82 y.o. female presents to the emergency department for evaluation and treatment of possible swallowed foreign body. See HPI for further details.   Differential diagnosis includes, but is not limited to foreign body, pill esophagitis, respiratory distress, aspiration pneumonia.  Patient's presentation is  most consistent with acute complicated illness / injury requiring diagnostic workup.  Patient is alert and oriented.  She is in no acute distress and hemodynamically stable with normal speech.  She is tolerating her oral secretions.  Physical exam findings are benign.  No stridor indicating the absence of obstruction.   Patient administered glucagon.  Reassurance provided that the pill and bite of cereal bar will dissolve in 24 hours.  Patient education provided of taking pills. The patient is in stable and satisfactory condition for discharge home. Encouraged to follow up with her primary care for further management. ED precautions discussed. All questions and concerns were addressed during this ED visit.     FINAL CLINICAL IMPRESSION(S) / ED DIAGNOSES   Final diagnoses:  Pill esophagitis   Rx / DC Orders   ED Discharge Orders     None        Note:  This document was prepared using Dragon voice recognition software and may include unintentional dictation errors.    Romeo Apple, Ziair Penson A, PA-C 07/22/23  1331    Janith Lima, MD 07/22/23 [phone]

## 2023-07-22 NOTE — Discharge Instructions (Addendum)
Your evaluated in the ED for possible food and or pill sensation stuck in inside your throat.  This substance should dissolve in the next 24 hours and spontaneously resolve itself.   For further pill taking technique follow these instructions.: -Take pills with a full glass of water. -Avoid lying down immediately after taking medication.  Return to ED if you develop new symptoms such as fever, chest pain, or inability to swallow.   Follow-up with your primary care in a week's time.  If symptoms do not improve follow-up with gastroenterology for possible endoscopy.

## 2023-07-23 ENCOUNTER — Ambulatory Visit
Admission: RE | Admit: 2023-07-23 | Discharge: 2023-07-23 | Disposition: A | Discharge status: Home / Self Care | Payer: Medicare HMO | Source: Ambulatory Visit | Attending: Radiation Oncology | Admitting: Radiation Oncology

## 2023-07-23 VITALS — BP 96/62 | HR 111 | Temp 98.2°F | Resp 14 | Ht 63.0 in | Wt 96.8 lb

## 2023-07-23 DIAGNOSIS — R911 Solitary pulmonary nodule: Secondary | ICD-10-CM | POA: Insufficient documentation

## 2023-07-23 NOTE — Progress Notes (Signed)
Radiation Oncology Follow up Note  Name: [name]   Date:   07/23/2023 MRN:  [id number] DOB: 15-Sep-1940    This 82 y.o. 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  REFERRING PROVIDER: [name], MD  HPI: he patient, an 82 year old with a recent history of stage one non-small cell lung cancer of the right lower lobe, presents one month post stereotactic body radiation therapy (SBRT). She reports no new or worsening symptoms, specifically denying cough, dysphagia, or changes in breathing. She expresses gratitude for the early detection of her cancer..  COMPLICATIONS OF TREATMENT: none  FOLLOW UP COMPLIANCE: keeps appointments   PHYSICAL EXAM:  BP 96/62   Pulse (!) 111   Temp 98.2 F (36.8 C)   Resp 14   Ht 5\' 3"  (1.6 m)   Wt 96 lb 12.8 oz (43.9 kg)   BMI 17.15 kg/m  Well-developed well-nourished patient in NAD. HEENT reveals PERLA, EOMI, discs not visualized.  Oral cavity is clear. No oral mucosal lesions are identified. Neck is clear without evidence of cervical or supraclavicular adenopathy. Lungs are clear to A&P. Cardiac examination is essentially unremarkable with regular rate and rhythm without murmur rub or thrill. Abdomen is benign with no organomegaly or masses noted. Motor sensory and DTR levels are equal and symmetric in the upper and lower extremities. Cranial nerves II through XII are grossly intact. Proprioception is intact. No peripheral adenopathy or edema is identified. No motor or sensory levels are noted. Crude visual fields are within normal range.  RADIOLOGY RESULTS: CT scan ordered in 3 months  PLAN: Stage I Non-Small Cell Lung Cancer (Right Lower Lobe) Patient is one month post-Stereotactic Body Radiation Therapy (SBRT) with no reported symptoms or complications. Lung sounds clear on examination. -Order CT chest one week prior to next  appointment. -Schedule follow-up appointment in three months. -If CT scan is satisfactory, extend follow-up to six months.    [name], MD

## 2023-08-11 ENCOUNTER — Ambulatory Visit (INDEPENDENT_AMBULATORY_CARE_PROVIDER_SITE_OTHER): Payer: Medicare HMO | Admitting: Dermatology

## 2023-08-11 DIAGNOSIS — L720 Epidermal cyst: Secondary | ICD-10-CM

## 2023-08-11 DIAGNOSIS — L821 Other seborrheic keratosis: Secondary | ICD-10-CM | POA: Diagnosis not present

## 2023-08-11 DIAGNOSIS — Z872 Personal history of diseases of the skin and subcutaneous tissue: Secondary | ICD-10-CM

## 2023-08-11 DIAGNOSIS — W908XXA Exposure to other nonionizing radiation, initial encounter: Secondary | ICD-10-CM

## 2023-08-11 DIAGNOSIS — Z86007 Personal history of in-situ neoplasm of skin: Secondary | ICD-10-CM

## 2023-08-11 DIAGNOSIS — L570 Actinic keratosis: Secondary | ICD-10-CM | POA: Diagnosis not present

## 2023-08-11 DIAGNOSIS — L578 Other skin changes due to chronic exposure to nonionizing radiation: Secondary | ICD-10-CM

## 2023-08-11 NOTE — Patient Instructions (Signed)

## 2023-08-11 NOTE — Progress Notes (Signed)
Follow-Up Visit   Subjective  [name] is a 82 y.o. female who presents for the following: Spots on the right eyebrow and left nose, present for a few months. 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 History of Aks, PDT treatment in the past.  The patient has spots, moles and lesions to be evaluated, some may be new or changing.   The following portions of the chart were reviewed this encounter and updated as appropriate: medications, allergies, medical history  Review of Systems:  No other skin or systemic complaints except as noted in HPI or Assessment and Plan.  Objective  Well appearing patient in no apparent distress; mood and affect are within normal limits.  A focused examination was performed of the following areas: Face  Relevant physical exam findings are noted in the Assessment and Plan.  L upper paranasal x 1, L nasal root x 1, L nasal tip x 1, L nasal dorsum x 1 (4) Pink scaly macules.     Assessment & Plan   AK (actinic keratosis) (4) L upper paranasal x 1, L nasal root x 1, L nasal tip x 1, L nasal dorsum x 1  Discussed topical 5FU on follow-up if areas not clear after cryotherapy.   Actinic keratoses are precancerous spots that appear secondary to cumulative UV radiation exposure/sun exposure over time. They are chronic with expected duration over 1 year. A portion of actinic keratoses will progress to squamous cell carcinoma of the skin. It is not possible to reliably predict which spots will progress to skin cancer and so treatment is recommended to prevent development of skin cancer.  Recommend daily broad spectrum sunscreen SPF 30+ to sun-exposed areas, reapply every 2 hours as needed.  Recommend staying in the shade or wearing long sleeves, sun glasses (UVA+UVB protection) and wide brim hats (4-inch brim around the entire circumference of the hat). Call for new or changing lesions.  Destruction of lesion - L upper paranasal x 1, L nasal  root x 1, L nasal tip x 1, L nasal dorsum x 1 (4)  Destruction method: cryotherapy   Informed consent: discussed and consent obtained   Lesion destroyed using liquid nitrogen: Yes   Region frozen until ice ball extended beyond lesion: Yes   Outcome: patient tolerated procedure well with no complications   Post-procedure details: wound care instructions given   Additional details:  Prior to procedure, discussed risks of blister formation, small wound, skin dyspigmentation, or rare scar following cryotherapy. Recommend Vaseline ointment to treated areas while healing.   ACTINIC DAMAGE - chronic, secondary to cumulative UV radiation exposure/sun exposure over time - diffuse scaly erythematous macules with underlying dyspigmentation - Recommend daily broad spectrum sunscreen SPF 30+ to sun-exposed areas, reapply every 2 hours as needed.  - Recommend staying in the shade or wearing long sleeves, sun glasses (UVA+UVB protection) and wide brim hats (4-inch brim around the entire circumference of the hat). - Call for new or changing lesions.  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  - Call if any new or changing lesions are noted between office visits  SEBORRHEIC KERATOSIS - Stuck-on, waxy, tan-brown papules and/or plaques, including right eyebrow  - Benign-appearing - Discussed benign etiology and prognosis. - Observe - Call for  any changes  MILIA, IRRITATED Exam: tiny erythematous firm white papule at left medial canthus.  Discussed this is a type of cyst. Benign-appearing. Sometimes these will clear with OTC adapalene/Differin 0.1% cream QHS or retinol.  Discussed extraction if symptomatic.  Treatment Plan: Symptomatic, irritating, patient would like treated.   Procedure risks and  benefits were discussed with the patient including bruising and verbal consent was obtained. Following prep of the skin of the left medial canthus with an alcohol swab, area was injected with 1% lidocaine/epinephrine, extraction of milia was performed with cotton tip applicators following superficial incision made over their surfaces with a #11 surgical blade. Capillary hemostasis was achieved with 20% aluminum chloride solution. Vaseline ointment was applied to each site. The patient tolerated the procedure well.   Return as scheduled, for TBSE, AK f/u.  ICherlyn Labella, CMA, am acting as scribe for Willeen Niece, MD .   Documentation: I have reviewed the above documentation for accuracy and completeness, and I agree with the above.  Willeen Niece, MD

## 2023-08-15 ENCOUNTER — Ambulatory Visit: Payer: Medicare HMO | Admitting: Pulmonary Disease

## 2023-08-15 ENCOUNTER — Encounter: Payer: Self-pay | Admitting: Pulmonary Disease

## 2023-08-15 VITALS — BP 110/72 | HR 87 | Temp 96.8°F | Ht 63.0 in | Wt 97.2 lb

## 2023-08-15 DIAGNOSIS — E871 Hypo-osmolality and hyponatremia: Secondary | ICD-10-CM

## 2023-08-15 DIAGNOSIS — C3491 Malignant neoplasm of unspecified part of right bronchus or lung: Secondary | ICD-10-CM

## 2023-08-15 DIAGNOSIS — I480 Paroxysmal atrial fibrillation: Secondary | ICD-10-CM

## 2023-08-15 NOTE — Patient Instructions (Signed)
VISIT SUMMARY:  During your visit, we reviewed your recent health progress and discussed the management of your lung nodule, atrial fibrillation, and low sodium levels. You have completed your SBRT treatment for the lung nodule and are experiencing improved mobility after your hip issues. We also addressed your sinus problems and current medications.  YOUR PLAN:  -LUNG NODULE: A lung nodule is a small growth in the lung that can be benign or malignant. You completed SBRT treatment for this condition, and there are no significant respiratory symptoms related to the treatment. We will schedule a follow-up appointment after your scan in February to review the results and determine any further action if necessary.  -ATRIAL FIBRILLATION: Atrial fibrillation is an irregular and often rapid heart rate that can increase the risk of strokes and other heart-related complications. You developed this condition post-surgery and were initially taken off Eliquis, but Dr. Lewie Loron has recommended resuming it at a low dosage due to the potential for recurrence. Please continue taking Eliquis as prescribed.  -HYPONATREMIA: Hyponatremia is a condition where sodium levels in the blood are lower than normal. Your sodium levels have improved with the current management, which includes taking salt pills. Please continue taking the salt pills and monitor your sodium levels with regular lab tests.  -GENERAL HEALTH MAINTENANCE: You have received your flu shot, and no additional action is required at this time.  INSTRUCTIONS:  Please schedule a follow-up appointment towards the end of February to review the results of your scan and discuss any further action needed.

## 2023-08-15 NOTE — Progress Notes (Signed)
Subjective:    Patient ID: [name], female    DOB: Jan 16, 1941, 82 y.o.   MRN: [id number]  Patient Care Team: [name], MD as PCP - General (Internal Medicine)  Chief Complaint  Patient presents with   Follow-up    Nodule. No SOB or wheezing. Dry cough.    BACKGROUND/INTERVAL: Patient is an 82 year old lifelong never smoker with secondhand smoke exposure in the past who presents for follow-up of a right lower lobe lung nodule.  Patient was initially evaluated on 18 March 2023 with a follow-up on 07 May 2023.  At that time she was having significant difficulties with a recent hip fracture and poor response to repair.  The patient at the time was hesitant to undergo further procedures and opted for empiric SBRT as the nodule was hypermetabolic on PET/CT.  He has completed SBRT therapy.  Since her last visit she has improved significantly and now is able to ambulate independently again.  She is back on Eliquis given a prior history of PAT.  HPI Discussed the use of AI scribe software for clinical note transcription with the patient, who gave verbal consent to proceed.  History of Present Illness   The patient, with a history of a lung nodule, recently completed a course of Stereotactic Body Radiation Therapy (SBRT) under the care of Dr. Rushie Chestnut. The treatment was completed approximately two months ago and was well-tolerated, with no reported cough or other respiratory symptoms related to the treatment.  The patient also reports a recent improvement in mobility following issues with her hip. She is due for a surgical follow-up and is hopeful for a full recovery, which would allow her to resume driving.  In addition to her lung and hip issues, the patient has been dealing with sinus problems, which have caused some coughing. She is currently managing these symptoms with over-the-counter antihistamines.  These have been effective.  The patient also has a history of atrial  fibrillation (Afib) and low sodium, both of which developed after surgery. She was initially taken off Eliquis by her primary care provider due to the absence of Afib, but was later put back on a low dose by her cardiologist due to the risk of recurrence. She is also on a salt pill for her sodium levels, which have shown improvement on recent labs.  The patient is scheduled for a scan in February under the care of Dr. Rushie Chestnut.  She reports feeling like she is making progress in her health.     Review of Systems A 10 point review of systems was performed and it is as noted above otherwise negative.   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   Essential hypertension 03/24/2023   Chronic mitral valve regurgitation 03/24/2023   Bilateral lower extremity edema 03/24/2023   GERD (gastroesophageal reflux disease) 02/11/2014   Dyslipidemia 03/06/2010   CAD, NATIVE VESSEL 03/06/2010    Social History   Tobacco Use   Smoking status: Never    Passive exposure: Past   Smokeless tobacco: Never  Substance Use Topics   Alcohol use: No    Allergies  Allergen Reactions   Bisphosphonates Other (See Comments)    Swallowing problems    Boniva [Ibandronic Acid] Other (See Comments)    Swallowing problems  Fosamax [Alendronate Sodium] Other (See Comments)    Swallowing problems    Lipitor [Atorvastatin] Other (See Comments)    Knee pain    Etodolac Rash    Current Meds  Medication Sig   apixaban (ELIQUIS) 2.5 MG TABS tablet Take 1 tablet (2.5 mg total) by mouth 2 (two) times daily.   Denosumab (PROLIA [place]) Inject 1 Dose into the skin every 6 (six) months.   famotidine (PEPCID) 40 MG tablet Take 40 mg by mouth at bedtime.   HYDROcodone-acetaminophen (NORCO) 7.5-325 MG tablet Take  1 tablet by mouth every 4 (four) hours as needed for severe pain.   lovastatin (MEVACOR) 40 MG tablet Take 40 mg by mouth at bedtime.   methocarbamol (ROBAXIN) 500 MG tablet Take 1 tablet (500 mg total) by mouth every 6 (six) hours as needed for muscle spasms.   metoprolol succinate (TOPROL-XL) 25 MG 24 hr tablet Take 1 tablet by mouth 2 (two) times daily.   Multiple Vitamin (MULTIVITAMIN) tablet Take 1 tablet by mouth daily.   omeprazole (PRILOSEC) 40 MG capsule Take 40 mg by mouth daily.   ondansetron (ZOFRAN-ODT) 4 MG disintegrating tablet Take 1 tablet (4 mg total) by mouth every 8 (eight) hours as needed for nausea or vomiting.   polyethylene glycol powder (MIRALAX) 17 GM/SCOOP powder Take 17 g by mouth 2 (two) times daily as needed for moderate constipation.   senna-docusate (SENOKOT-S) 8.6-50 MG tablet Take 1 tablet by mouth 2 (two) times daily between meals as needed for mild constipation.   triamcinolone (KENALOG) 0.025 % ointment Apply 1 Application topically 2 (two) times daily. For 3-5 days.    Immunization History  Administered Date(s) Administered   Influenza, High Dose Seasonal PF 08/05/2016, 05/12/2017   Influenza, Mdck, Trivalent,PF 6+ MOS(egg free) 06/20/2023   Moderna Sars-Covid-2 Vaccination 09/14/2019, 10/12/2019, 06/19/2020, 07/16/2020   Pneumococcal Conjugate-13 06/04/2017   Pneumococcal Polysaccharide-23 01/02/2015   Zoster Recombinant(Shingrix) 02/19/2018, 02/20/2018, 05/04/2018   Zoster, Live 10/04/2012      Objective:     BP 110/72 (BP Location: Left Arm, Cuff Size: Normal)   Pulse 87   Temp (!) 96.8 F (36 C)   Ht 5\' 3"  (1.6 m)   Wt 97 lb 3.2 oz (44.1 kg)   SpO2 98%   BMI 17.22 kg/m   SpO2: 98 % O2 Device: None (Room air)  GENERAL: Thin, frail-appearing woman, no acute distress.  She is fully ambulatory!.  No conversational dyspnea. HEAD: Normocephalic, atraumatic.  EYES: Pupils equal, round, reactive to light.  No scleral icterus.  MOUTH:  Dentition intact.  Mucosa moist. NECK: Supple. No thyromegaly. Trachea midline. No JVD.  No adenopathy. PULMONARY: Good air entry bilaterally.  No adventitious sounds. CARDIOVASCULAR: S1 and S2. Regular rate and rhythm.  ABDOMEN: Benign. MUSCULOSKELETAL: No joint deformity, no clubbing, no edema.  NEUROLOGIC: Grossly intact.  No overt gait disturbance noted. SKIN: Intact,warm,dry. PSYCH: Mood and behavior normal.  Assessment & Plan:     ICD-10-CM   1. Non-small cell cancer of right lung (HCC)  C34.91     2. Paroxysmal atrial fibrillation (HCC)  I48.0     3. Hyponatremia due to SIADH  E87.1      Assessment and Plan    Lung Nodule Treated empirically with SBRT, completed treatment with Dr. Rushie Chestnut a couple of months ago. No significant cough related to treatment, some sinus-related cough. No fevers or chills. Physical exam reveals clear lung sounds. Walking has returned to normal post-hip issues. - Schedule follow-up appointment  after February scan to review results and determine further action if necessary.  Atrial Fibrillation Developed post-surgery, initially taken off Eliquis by primary care physician due to absence of detectable afib. Dr. Mariah Milling recommended resuming Eliquis at a low dosage due to potential recurrence. Sodium levels have improved with current management. - Continue Eliquis at low dosage as prescribed by Dr. Mariah Milling.  Hyponatremia Low sodium levels post-surgery, recent labs show improvement. Currently on salt pills. - Continue salt pills. Monitor sodium levels with regular lab tests.  General Health Maintenance Up-to-date on flu vaccine. - No additional action required.  Follow-up - Schedule follow-up appointment towards the end of February to review CT chest.      Advised if symptoms do not improve or worsen, to please contact office for sooner follow up or seek emergency care.    I spent 30 minutes of dedicated to the care of this patient on the date of  this encounter to include pre-visit review of records, face-to-face time with the patient discussing conditions above, post visit ordering of testing, clinical documentation with the electronic health record, making appropriate referrals as documented, and communicating necessary findings to members of the patients care team.    C. Danice Goltz, MD Advanced Bronchoscopy PCCM [facility]    *This note was generated using voice recognition software/Dragon and/or AI transcription program.  Despite best efforts to proofread, errors can occur which can change the meaning. Any transcriptional errors that result from this process are unintentional and may not be fully corrected at the time of dictation.

## 2023-09-14 NOTE — Progress Notes (Signed)
 Ellouise Console, PA-C [address]  [address]  [place], [place] [address]  Main: [phone]  Fax: [phone]   Gastroenterology Consultation  Referring Provider:     Cleotilde Oneil FALCON, MD Primary Care Physician:  Cleotilde Oneil FALCON, MD Primary Gastroenterologist:  Ellouise Console, PA-C  Reason for Consultation:     ED F/U Dysphagia        HPI:   [name] is a 83 y.o. y/o female referred for consultation & management  by Cleotilde Oneil FALCON, MD.    She went to [facility] ED 07/22/2023 to evaluate pill dysphagia.  She was concerned that a pill lodged in her throat.  She was able to tolerate food and was not having any secretions.  She was given glucagon  and then was able to tolerate oral food and liquids in the ED.  Was told to follow-up with GI outpatient.  She is currently taking omeprazole 40 Mg every morning and Pepcid  40 Mg every afternoon for acid reflux.  History of atrial fibrillation currently on Eliquis .  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 Aundria: 1 cm hiatal hernia, 1 benign-appearing mild stenosis at the GE junction dilated to 80F for [facility] dilator.  07/02/2023 labs: Normal hemoglobin 12.6.  Normal CMP except mild low sodium 134, chloride 96.  Current symptoms: She feels like pills and food are getting stuck in her throat when she swallows for 2 or 3 months.  She occasionally has to vomit food or pills back out of her throat.  She does not have any chest pain or abdominal pain.  Denies heartburn or acid reflux.  Denies sore throat or odynophagia.  Past Medical History:   Diagnosis Date   Actinic keratosis 11/10/2019   Left superior shoulder. Bx proven.   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 SCCis    Past Surgical History:  Procedure Laterality Date   ABDOMINAL HYSTERECTOMY     partial   CARDIAC CATHETERIZATION     [facility]   cataract     COLONOSCOPY WITH PROPOFOL  N/A 06/22/2015   Procedure: COLONOSCOPY WITH PROPOFOL ;  Surgeon: Deward CINDERELLA Piedmont, MD;  Location: [facility] ENDOSCOPY;  Service: Gastroenterology;  Laterality: N/A;   ESOPHAGOGASTRODUODENOSCOPY (EGD) WITH PROPOFOL  N/A 06/22/2015   Procedure: ESOPHAGOGASTRODUODENOSCOPY (EGD) WITH PROPOFOL ;  Surgeon: Deward CINDERELLA Piedmont, MD;  Location: [facility] ENDOSCOPY;  Service: Gastroenterology;  Laterality: N/A;   ESOPHAGOGASTRODUODENOSCOPY (EGD) WITH PROPOFOL  N/A 07/12/2020   Procedure: ESOPHAGOGASTRODUODENOSCOPY (EGD) WITH PROPOFOL ;  Surgeon: Toledo, Ladell POUR, MD;  Location: [facility] ENDOSCOPY;  Service: Gastroenterology;  Laterality: N/A;   INTRAMEDULLARY (IM) NAIL INTERTROCHANTERIC Right 03/24/2023   Procedure: INTRAMEDULLARY (IM) NAIL FEMUR;  Surgeon: Kendal Franky SQUIBB, MD;  Location: [facility] OR;  Service: Orthopedics;  Laterality: Right;   PARTIAL HYSTERECTOMY     TONSILLECTOMY  Prior to Admission medications   Medication Sig Start Date End Date Taking? Authorizing Provider  apixaban  (ELIQUIS ) 2.5 MG TABS tablet Take 1 tablet (2.5 mg total) by mouth 2 (two) times daily. 05/27/23   Gollan, Timothy J, MD  Denosumab (PROLIA [place]) Inject 1 Dose into the skin every 6 (six) months.    [provider]  famotidine  (PEPCID ) 40 MG tablet Take 40 mg by mouth at bedtime. 09/26/21   [provider]  HYDROcodone -acetaminophen  (NORCO) 7.5-325  MG tablet Take 1 tablet by mouth every 4 (four) hours as needed for severe pain. 03/26/23   Danton Lauraine LABOR, PA-C  lovastatin (MEVACOR) 40 MG tablet Take 40 mg by mouth at bedtime.    [provider]  methocarbamol  (ROBAXIN ) 500 MG tablet Take 1 tablet (500 mg total) by mouth every 6 (six) hours as needed for muscle spasms. 03/26/23   Danton Lauraine LABOR, PA-C  metoprolol  succinate (TOPROL -XL) 25 MG 24 hr tablet Take 1 tablet by mouth 2 (two) times daily. 04/23/23   [provider]  Multiple Vitamin (MULTIVITAMIN) tablet Take 1 tablet by mouth daily.    [provider]  omeprazole (PRILOSEC) 40 MG capsule Take 40 mg by mouth daily.    [provider]  ondansetron  (ZOFRAN -ODT) 4 MG disintegrating tablet Take 1 tablet (4 mg total) by mouth every 8 (eight) hours as needed for nausea or vomiting. 03/26/23   Danton Lauraine LABOR, PA-C  polyethylene glycol powder (MIRALAX ) 17 GM/SCOOP powder Take 17 g by mouth 2 (two) times daily as needed for moderate constipation. 04/04/23   Gonfa, Taye T, MD  senna-docusate (SENOKOT-S) 8.6-50 MG tablet Take 1 tablet by mouth 2 (two) times daily between meals as needed for mild constipation. 04/04/23   Gonfa, Taye T, MD  triamcinolone  (KENALOG ) 0.025 % ointment Apply 1 Application topically 2 (two) times daily. For 3-5 days. 09/19/22   Moye, [place] , MD    Family History  Problem Relation Age of Onset   Heart failure Mother        CHF   Transient ischemic attack Mother    Heart disease Father    Heart failure Father    Coronary artery disease Other    Breast cancer Maternal Aunt 54     Social History   Tobacco Use   Smoking status: Never    Passive exposure: Past   Smokeless tobacco: Never  Vaping Use   Vaping status: Never Used  Substance Use Topics   Alcohol use: No   Drug use: No    Allergies as of 09/15/2023 - Review Complete 09/15/2023  Allergen Reaction Noted   Bisphosphonates Other (See Comments) 06/21/2015   Boniva  [ibandronic acid] Other (See Comments) 06/21/2015   Fosamax [alendronate sodium] Other (See Comments) 06/21/2015   Lipitor [atorvastatin] Other (See Comments) 06/21/2015   Etodolac Rash 03/18/2023    Review of Systems:    All systems reviewed and negative except where noted in HPI.   Physical Exam:  BP 107/68   Pulse 68   Temp (!) 97.4 F (36.3 C)   Ht 5' 3 (1.6 m)   Wt 101 lb (45.8 kg)   BMI 17.89 kg/m  No LMP recorded. Patient has had a hysterectomy.  General:   Alert,  Well-developed, well-nourished, thin female; pleasant and cooperative in NAD; She is able to get on and off exam table with no difficulty.  Walks with no assistive devices. Neck: No thyromegaly, nodules, masses or lymphadenopathy. Lungs:  Respirations even  and unlabored.  Clear throughout to auscultation.   No wheezes, crackles, or rhonchi. No acute distress. Heart:  Regular rate and rhythm; no murmurs, clicks, rubs, or gallops. Abdomen:  Normal bowel sounds.  No bruits.  Soft, and non-distended without masses, hepatosplenomegaly or hernias noted.  No Tenderness.  No guarding or rebound tenderness.    Neurologic:  Alert and oriented x3;  grossly normal neurologically. Psych:  Alert and cooperative. Normal mood and affect.  Imaging Studies: No results found.  Assessment and Plan:   [name] is a 83 y.o. y/o female has been referred for:  1.  Chronic dysphagia; Intermittent for many years.  2.  History of esophageal stricture; last EGD with dilation 07/2020.  Barium swallow with tablet  Pending barium swallow results, decide about EGD with dilation  3.  Chronic GERD  Continue omeprazole 40 Mg every morning  Continue famotidine  40 Mg every evening.  4.  Paroxysmal atrial fibrillation, on Eliquis   If we schedule EGD the we will Request permission to hold Eliquis  prior to EGD with dilation.  Follow up 4 weeks with [facility] for Dysphagia.  Ellouise Console, PA-C

## 2023-09-15 ENCOUNTER — Encounter: Payer: Self-pay | Admitting: Physician Assistant

## 2023-09-15 ENCOUNTER — Ambulatory Visit: Payer: PPO | Admitting: Physician Assistant

## 2023-09-15 VITALS — BP 107/68 | HR 68 | Temp 97.4°F | Ht 63.0 in | Wt 101.0 lb

## 2023-09-15 DIAGNOSIS — Z8719 Personal history of other diseases of the digestive system: Secondary | ICD-10-CM | POA: Diagnosis not present

## 2023-09-15 DIAGNOSIS — I480 Paroxysmal atrial fibrillation: Secondary | ICD-10-CM

## 2023-09-15 DIAGNOSIS — R1310 Dysphagia, unspecified: Secondary | ICD-10-CM

## 2023-09-15 DIAGNOSIS — K219 Gastro-esophageal reflux disease without esophagitis: Secondary | ICD-10-CM

## 2023-09-15 NOTE — Patient Instructions (Signed)
 Barium Swallow scheduled 09/19/23 @ [facility] 09/19/23 2 9  am. Nothing to eat/drink 3 hours prior to having the test.

## 2023-09-17 ENCOUNTER — Telehealth: Payer: Self-pay

## 2023-09-17 DIAGNOSIS — R531 Weakness: Secondary | ICD-10-CM | POA: Diagnosis not present

## 2023-09-17 DIAGNOSIS — E871 Hypo-osmolality and hyponatremia: Secondary | ICD-10-CM | POA: Diagnosis not present

## 2023-09-17 DIAGNOSIS — J019 Acute sinusitis, unspecified: Secondary | ICD-10-CM | POA: Diagnosis not present

## 2023-09-17 DIAGNOSIS — I4891 Unspecified atrial fibrillation: Secondary | ICD-10-CM | POA: Diagnosis not present

## 2023-09-17 DIAGNOSIS — R058 Other specified cough: Secondary | ICD-10-CM | POA: Diagnosis not present

## 2023-09-17 DIAGNOSIS — Z03818 Encounter for observation for suspected exposure to other biological agents ruled out: Secondary | ICD-10-CM | POA: Diagnosis not present

## 2023-09-17 NOTE — Telephone Encounter (Signed)
 The patient wanted to know why her procedure was not scheduled.

## 2023-09-17 NOTE — Telephone Encounter (Signed)
 Patient scheduled for Barium Swallow 09/19/23 -stated she needs to cancel due to caring for her husband- she stated she would call back when its convenient for her.

## 2023-09-17 NOTE — Telephone Encounter (Signed)
 The patient called in and left a voicemail wanting to cancel her procedure on Friday she said that she will back reschedule it. I called her back to let her know that we got her message, and no answer I left a voicemail with her procedure. I inform her that I don't see a procedure schedule on 10/20/23, but I do see a follow up office visit with [name] schedule. I see that she has appointment with [facility] and asked was she transferring her care because if she is not, she will need to cancel the appointment with them. The patient is transferring her care to [facility]. Since she goes to [facility] for her PCP, she would like her GI to be there as well.

## 2023-09-19 ENCOUNTER — Other Ambulatory Visit: Payer: Self-pay

## 2023-09-23 DIAGNOSIS — J188 Other pneumonia, unspecified organism: Secondary | ICD-10-CM | POA: Diagnosis not present

## 2023-09-23 DIAGNOSIS — J45902 Unspecified asthma with status asthmaticus: Secondary | ICD-10-CM | POA: Diagnosis not present

## 2023-09-23 DIAGNOSIS — J40 Bronchitis, not specified as acute or chronic: Secondary | ICD-10-CM | POA: Diagnosis not present

## 2023-09-25 DIAGNOSIS — J40 Bronchitis, not specified as acute or chronic: Secondary | ICD-10-CM | POA: Diagnosis not present

## 2023-09-25 DIAGNOSIS — C3491 Malignant neoplasm of unspecified part of right bronchus or lung: Secondary | ICD-10-CM | POA: Diagnosis not present

## 2023-09-25 DIAGNOSIS — J45902 Unspecified asthma with status asthmaticus: Secondary | ICD-10-CM | POA: Diagnosis not present

## 2023-10-13 DIAGNOSIS — M816 Localized osteoporosis [Lequesne]: Secondary | ICD-10-CM | POA: Diagnosis not present

## 2023-10-13 DIAGNOSIS — E538 Deficiency of other specified B group vitamins: Secondary | ICD-10-CM | POA: Diagnosis not present

## 2023-10-13 DIAGNOSIS — E782 Mixed hyperlipidemia: Secondary | ICD-10-CM | POA: Diagnosis not present

## 2023-10-13 DIAGNOSIS — R739 Hyperglycemia, unspecified: Secondary | ICD-10-CM | POA: Diagnosis not present

## 2023-10-16 ENCOUNTER — Other Ambulatory Visit: Payer: Self-pay | Admitting: Internal Medicine

## 2023-10-16 DIAGNOSIS — Z1231 Encounter for screening mammogram for malignant neoplasm of breast: Secondary | ICD-10-CM

## 2023-10-20 ENCOUNTER — Ambulatory Visit: Payer: PPO | Admitting: Physician Assistant

## 2023-10-21 ENCOUNTER — Ambulatory Visit
Admission: RE | Admit: 2023-10-21 | Discharge: 2023-10-21 | Disposition: A | Discharge status: Home / Self Care | Payer: PPO | Source: Ambulatory Visit | Attending: Radiation Oncology | Admitting: Radiation Oncology

## 2023-10-21 DIAGNOSIS — R911 Solitary pulmonary nodule: Secondary | ICD-10-CM | POA: Diagnosis not present

## 2023-10-21 DIAGNOSIS — C3491 Malignant neoplasm of unspecified part of right bronchus or lung: Secondary | ICD-10-CM | POA: Diagnosis not present

## 2023-10-21 DIAGNOSIS — C3431 Malignant neoplasm of lower lobe, right bronchus or lung: Secondary | ICD-10-CM | POA: Diagnosis not present

## 2023-10-21 DIAGNOSIS — C3411 Malignant neoplasm of upper lobe, right bronchus or lung: Secondary | ICD-10-CM | POA: Diagnosis not present

## 2023-10-21 DIAGNOSIS — E782 Mixed hyperlipidemia: Secondary | ICD-10-CM | POA: Diagnosis not present

## 2023-10-21 DIAGNOSIS — I700 Atherosclerosis of aorta: Secondary | ICD-10-CM | POA: Diagnosis not present

## 2023-10-21 DIAGNOSIS — Z0000 Encounter for general adult medical examination without abnormal findings: Secondary | ICD-10-CM | POA: Diagnosis not present

## 2023-10-21 DIAGNOSIS — J984 Other disorders of lung: Secondary | ICD-10-CM | POA: Diagnosis not present

## 2023-10-21 MED ORDER — IOHEXOL 300 MG/ML  SOLN
60.0000 mL | Freq: Once | INTRAMUSCULAR | Status: AC | PRN
  Administered 2023-10-21: 60 mL via INTRAVENOUS

## 2023-10-22 ENCOUNTER — Ambulatory Visit: Payer: PPO

## 2023-10-23 ENCOUNTER — Ambulatory Visit: Admission: RE | Admit: 2023-10-23 | Payer: PPO | Source: Ambulatory Visit

## 2023-10-27 ENCOUNTER — Ambulatory Visit
Admission: RE | Admit: 2023-10-27 | Discharge: 2023-10-27 | Disposition: A | Discharge status: Home / Self Care | Payer: PPO | Source: Ambulatory Visit | Attending: Internal Medicine | Admitting: Internal Medicine

## 2023-10-27 DIAGNOSIS — Z1231 Encounter for screening mammogram for malignant neoplasm of breast: Secondary | ICD-10-CM | POA: Diagnosis not present

## 2023-10-30 ENCOUNTER — Ambulatory Visit: Payer: PPO | Admitting: Pulmonary Disease

## 2023-10-30 ENCOUNTER — Ambulatory Visit
Admission: RE | Admit: 2023-10-30 | Discharge: 2023-10-30 | Disposition: A | Discharge status: Home / Self Care | Payer: PPO | Source: Ambulatory Visit | Attending: Radiation Oncology | Admitting: Radiation Oncology

## 2023-10-30 ENCOUNTER — Encounter: Payer: Self-pay | Admitting: Pulmonary Disease

## 2023-10-30 ENCOUNTER — Encounter: Payer: Self-pay | Admitting: Radiation Oncology

## 2023-10-30 VITALS — BP 114/67 | HR 79 | Temp 97.6°F | Resp 16 | Ht 63.0 in | Wt 94.9 lb

## 2023-10-30 VITALS — BP 104/60 | HR 76 | Temp 96.6°F | Ht 63.0 in | Wt 95.0 lb

## 2023-10-30 DIAGNOSIS — R059 Cough, unspecified: Secondary | ICD-10-CM | POA: Insufficient documentation

## 2023-10-30 DIAGNOSIS — I480 Paroxysmal atrial fibrillation: Secondary | ICD-10-CM | POA: Diagnosis not present

## 2023-10-30 DIAGNOSIS — R911 Solitary pulmonary nodule: Secondary | ICD-10-CM | POA: Insufficient documentation

## 2023-10-30 DIAGNOSIS — Z923 Personal history of irradiation: Secondary | ICD-10-CM | POA: Diagnosis not present

## 2023-10-30 DIAGNOSIS — C3431 Malignant neoplasm of lower lobe, right bronchus or lung: Secondary | ICD-10-CM | POA: Diagnosis not present

## 2023-10-30 DIAGNOSIS — R0989 Other specified symptoms and signs involving the circulatory and respiratory systems: Secondary | ICD-10-CM | POA: Insufficient documentation

## 2023-10-30 DIAGNOSIS — C3491 Malignant neoplasm of unspecified part of right bronchus or lung: Secondary | ICD-10-CM | POA: Diagnosis not present

## 2023-10-30 NOTE — Progress Notes (Signed)
 Subjective:    Patient ID: [name], female    DOB: 1941-07-21, 83 y.o.   MRN: [id number]  Patient Care Team: [name], MD as PCP - General (Internal Medicine)  Chief Complaint  Patient presents with   Follow-up    Nodule. Cough, dry but had pneumonia. No SOB or wheezing.     BACKGROUND/INTERVAL: Patient is an 83 year old lifelong never smoker with secondhand smoke exposure in the past who presents for follow-up of a right lower lobe lung nodule.  On initial evaluation, she was having significant difficulties with a hip fracture and poor response to repair.  She had lost significant amount of mobility and was very debilitated.  The patient was hesitant to undergo further procedures and opted for empiric SBRT as the nodule was hypermetabolic on PET/CT.  He has completed SBRT therapy.  He was last seen December of 2024 and at the time it was noted that she had improved significantly and was ambulating independently again.  She is on Eliquis given a prior history of PAT.   HPI Discussed the use of AI scribe software for clinical note transcription with the patient, who gave verbal consent to proceed.  History of Present Illness   [name] is an 83 year old female who presents for a routine follow-up visit for a pulmonary nodule.  Recent imaging results for the pulmonary nodule showed expected postradiation fibrosis. She is currently asymptomatic regarding this.  She had pneumonia in January, which was successfully treated with medication, avoiding hospitalization. She now experiences occasional coughing but is otherwise recovering well.  She had a mammogram on February 24th, which showed good results, and an echocardiogram last year that was normal. She is under the care of her primary physician Dr. Bethann Punches and follows with radiation oncology , Dr. Rushie Chestnut, and is doing well overall.  She is managing multiple medical appointments and wants to reduce the number  of doctors she visits.     Review of Systems A 10 point review of systems was performed and it is as noted above otherwise negative.   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   Essential hypertension 03/24/2023   Chronic mitral valve regurgitation 03/24/2023   Bilateral lower extremity edema 03/24/2023   GERD (gastroesophageal reflux disease) 02/11/2014   Dyslipidemia 03/06/2010   CAD, NATIVE VESSEL 03/06/2010    Social History   Tobacco Use   Smoking status: Never    Passive exposure: Past   Smokeless tobacco: Never  Substance Use Topics   Alcohol use: No    Allergies  Allergen Reactions   Bisphosphonates Other (See Comments)    Swallowing problems    Boniva [Ibandronate] Other (See Comments)    Swallowing problems    Fosamax [Alendronate Sodium] Other (See Comments)    Swallowing problems    Lipitor [Atorvastatin] Other (See Comments)    Knee pain    Etodolac Rash    Current Meds  Medication Sig   apixaban (ELIQUIS) 2.5 MG TABS tablet Take 1 tablet (2.5 mg total) by mouth 2 (two) times daily.   Denosumab (PROLIA [place]) Inject 1 Dose into the skin every 6 (six) months.   famotidine (PEPCID) 40 MG tablet Take 40 mg by mouth at bedtime.   lovastatin (MEVACOR) 40 MG  tablet Take 40 mg by mouth at bedtime.   methocarbamol (ROBAXIN) 500 MG tablet Take 1 tablet (500 mg total) by mouth every 6 (six) hours as needed for muscle spasms.   metoprolol succinate (TOPROL-XL) 25 MG 24 hr tablet Take 1 tablet by mouth 2 (two) times daily.   Multiple Vitamin (MULTIVITAMIN) tablet Take 1 tablet by mouth daily.   omeprazole (PRILOSEC) 40 MG capsule Take 40 mg by mouth daily.   polyethylene glycol powder (MIRALAX) 17 GM/SCOOP powder Take 17 g by mouth 2 (two) times daily as  needed for moderate constipation.   triamcinolone (KENALOG) 0.025 % ointment Apply 1 Application topically 2 (two) times daily. For 3-5 days.    Immunization History  Administered Date(s) Administered   Influenza, High Dose Seasonal PF 08/05/2016, 05/12/2017   Influenza, Mdck, Trivalent,PF 6+ MOS(egg free) 06/20/2023   Moderna Sars-Covid-2 Vaccination 09/14/2019, 10/12/2019, 06/19/2020, 07/16/2020   Pneumococcal Conjugate-13 06/04/2017   Pneumococcal Polysaccharide-23 01/02/2015   Zoster Recombinant(Shingrix) 02/19/2018, 02/20/2018, 05/04/2018   Zoster, Live 10/04/2012        Objective:     BP 104/60 (BP Location: Left Arm, Cuff Size: Normal)   Pulse 76   Temp (!) 96.6 F (35.9 C)   Ht 5\' 3"  (1.6 m)   Wt 95 lb (43.1 kg)   SpO2 96%   BMI 16.83 kg/m   SpO2: 96 % O2 Device: None (Room air)  GENERAL: Thin, frail-appearing woman, no acute distress.  She is fully ambulatory!.  No conversational dyspnea. HEAD: Normocephalic, atraumatic.  EYES: Pupils equal, round, reactive to light.  No scleral icterus.  MOUTH: Dentition intact.  Mucosa moist. NECK: Supple. No thyromegaly. Trachea midline. No JVD.  No adenopathy. PULMONARY: Good air entry bilaterally.  No adventitious sounds. CARDIOVASCULAR: S1 and S2. Regular rate and rhythm.  ABDOMEN: Benign. MUSCULOSKELETAL: No joint deformity, no clubbing, no edema.  NEUROLOGIC: Grossly intact.  No overt gait disturbance noted. SKIN: Intact,warm,dry. PSYCH: Mood and behavior normal.       Assessment & Plan:     ICD-10-CM   1. Non-small cell cancer of right lung (HCC)  C34.91     2. Paroxysmal atrial fibrillation (HCC)  I48.0      Discussion:    Pulmonary Nodule Follow-up for a previously identified pulmonary nodule. Recent scan showed expected scarring with no new concerning findings. Discussed that PET scans are only necessary if follow-up scans reveal unusual findings. - Continue routine follow-up with primary physician and  oncologist - No PET scan unless follow-up scan shows unusual findings  Pneumonia Treated for pneumonia in January with antibiotics, no hospitalization required. Currently experiencing occasional cough. - Monitor for recurrence of symptoms - Follow-up with primary physician as needed  General Health Maintenance Recent mammogram on October 27, 2023, showed no concerning findings. Echocardiogram from last year was normal. - Continue routine health maintenance and screenings as recommended by primary physician  Follow-up - Follow-up scan in August as per radiation oncology - Follow-up here on an as-needed basis     Advised if symptoms do not improve or worsen, to please contact office for sooner follow up or seek emergency care.    I spent 30 minutes of dedicated to the care of this patient on the date of this encounter to include pre-visit review of records, face-to-face time with the patient discussing conditions above, post visit ordering of testing, clinical documentation with the electronic health record, making appropriate referrals as documented, and communicating necessary findings to members of the patients care  team.     Gailen Shelter, MD Advanced Bronchoscopy PCCM [facility]    *This note was generated using voice recognition software/Dragon and/or AI transcription program.  Despite best efforts to proofread, errors can occur which can change the meaning. Any transcriptional errors that result from this process are unintentional and may not be fully corrected at the time of dictation.

## 2023-10-30 NOTE — Progress Notes (Signed)
 Radiation Oncology Follow up Note  Name: [name]   Date:   10/30/2023 MRN:  [id number] DOB: [birth year]    This 83 y.o. female presents to the clinic today for 71-month follow-up status post SBRT to the right lower lobe for stage I non-small cell lung cancer.  REFERRING PROVIDER: [name], MD  HPI: Patient is a 83 year old female now out for months having completed SBRT to her right lower lobe for stage I non-small cell lung cancer.  Seen today in routine follow-up she is doing fairly well still has a mild persistent cough although has been dealing with.  What she describes as flu about 5 weeks ago.  She had a recent CT scan which I have reviewed showing New bandlike area of groundglass attenuation and consolidation in the right lower lobe compatible with changes secondary to radiation.  The underlying treated lung lesion is not visible.  She has new pleural thickening and subpleural groundglass attenuation in the posterior right upper lobe most likely post inflammatory. COMPLICATIONS OF TREATMENT: none  FOLLOW UP COMPLIANCE: keeps appointments   PHYSICAL EXAM:  BP 114/67   Pulse 79   Temp 97.6 F (36.4 C) (Tympanic)   Resp 16   Ht 5\' 3"  (1.6 m)   Wt 94 lb 14.4 oz (43 kg)   BMI 16.81 kg/m  Well-developed well-nourished patient in NAD. HEENT reveals PERLA, EOMI, discs not visualized.  Oral cavity is clear. No oral mucosal lesions are identified. Neck is clear without evidence of cervical or supraclavicular adenopathy. Lungs are clear to A&P. Cardiac examination is essentially unremarkable with regular rate and rhythm without murmur rub or thrill. Abdomen is benign with no organomegaly or masses noted. Motor sensory and DTR levels are equal and symmetric in the upper and lower extremities. Cranial nerves II through XII are grossly intact. Proprioception is intact. No peripheral adenopathy or edema is identified. No motor or sensory levels are noted. Crude visual fields are  within normal range.  RADIOLOGY RESULTS: CT scans reviewed compatible with above-stated findings  PLAN: Present time patient is doing well.  Excellent sponsor treatment by CT criteria.  Of asked to see her back in 6 months with a repeat CT scan at that time.  Should any of her pulmonary symptoms worsen she is to contact Dr. Jayme Cloud pulmonology.  Patient knows to call with any concerns.  I would like to take this opportunity to thank you for allowing me to participate in the care of your patient.Beth Miller, MD

## 2023-10-30 NOTE — Patient Instructions (Signed)
 VISIT SUMMARY:  Today, we reviewed your recent imaging results for the pulmonary nodule, discussed your recovery from pneumonia, and went over your general health maintenance. You are doing well overall, and we have outlined a plan to continue monitoring your health.  YOUR PLAN:  -PULMONARY NODULE: A pulmonary nodule is a small, round growth in the lung. Your recent scan showed expected scarring with no new concerning findings. We will continue routine follow-ups with your primary physician and oncologist. A PET scan will only be necessary if future scans show unusual findings.  -PNEUMONIA: Pneumonia is an infection that inflames the air sacs in one or both lungs. You were treated successfully with antibiotics in January and did not require hospitalization. You are currently experiencing occasional coughing. We will monitor for any recurrence of symptoms, and you should follow up with your primary physician as needed.  -GENERAL HEALTH MAINTENANCE: Your recent mammogram on February 24th showed no concerning findings, and your echocardiogram from last year was reassuring. Continue with routine health maintenance and screenings as recommended by your primary physician.  INSTRUCTIONS:  Please schedule a follow-up scan in August.

## 2023-10-31 DIAGNOSIS — H906 Mixed conductive and sensorineural hearing loss, bilateral: Secondary | ICD-10-CM | POA: Diagnosis not present

## 2023-10-31 DIAGNOSIS — M272 Inflammatory conditions of jaws: Secondary | ICD-10-CM | POA: Diagnosis not present

## 2023-10-31 DIAGNOSIS — H6123 Impacted cerumen, bilateral: Secondary | ICD-10-CM | POA: Diagnosis not present

## 2023-10-31 DIAGNOSIS — H6061 Unspecified chronic otitis externa, right ear: Secondary | ICD-10-CM | POA: Diagnosis not present

## 2023-11-04 DIAGNOSIS — M810 Age-related osteoporosis without current pathological fracture: Secondary | ICD-10-CM | POA: Diagnosis not present

## 2023-11-05 ENCOUNTER — Encounter: Payer: Self-pay | Admitting: Pulmonary Disease

## 2023-11-10 ENCOUNTER — Ambulatory Visit: Payer: Medicare HMO | Admitting: Dermatology

## 2023-11-17 ENCOUNTER — Ambulatory Visit: Payer: Medicare HMO | Admitting: Dermatology

## 2023-11-17 DIAGNOSIS — L820 Inflamed seborrheic keratosis: Secondary | ICD-10-CM

## 2023-11-17 DIAGNOSIS — Z872 Personal history of diseases of the skin and subcutaneous tissue: Secondary | ICD-10-CM

## 2023-11-17 DIAGNOSIS — L578 Other skin changes due to chronic exposure to nonionizing radiation: Secondary | ICD-10-CM

## 2023-11-17 DIAGNOSIS — D1801 Hemangioma of skin and subcutaneous tissue: Secondary | ICD-10-CM

## 2023-11-17 DIAGNOSIS — W908XXA Exposure to other nonionizing radiation, initial encounter: Secondary | ICD-10-CM

## 2023-11-17 DIAGNOSIS — L570 Actinic keratosis: Secondary | ICD-10-CM | POA: Diagnosis not present

## 2023-11-17 DIAGNOSIS — L821 Other seborrheic keratosis: Secondary | ICD-10-CM | POA: Diagnosis not present

## 2023-11-17 DIAGNOSIS — L814 Other melanin hyperpigmentation: Secondary | ICD-10-CM | POA: Diagnosis not present

## 2023-11-17 DIAGNOSIS — Z961 Presence of intraocular lens: Secondary | ICD-10-CM | POA: Diagnosis not present

## 2023-11-17 DIAGNOSIS — D229 Melanocytic nevi, unspecified: Secondary | ICD-10-CM

## 2023-11-17 DIAGNOSIS — Z86007 Personal history of in-situ neoplasm of skin: Secondary | ICD-10-CM | POA: Diagnosis not present

## 2023-11-17 DIAGNOSIS — Z1283 Encounter for screening for malignant neoplasm of skin: Secondary | ICD-10-CM

## 2023-11-17 NOTE — Progress Notes (Signed)
 Follow-Up Visit   Subjective  [name] is a 83 y.o. female who presents for the following: Skin Cancer Screening and Full Body Skin Exam  The patient presents for Total-Body Skin Exam (TBSE) for skin cancer screening and mole check. The patient has spots, moles and lesions to be evaluated, some may be new or changing. 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 History of Aks, has had Red light treatment in the past. She has a spot on her right eyebrow that feels like it is growing.    The following portions of the chart were reviewed this encounter and updated as appropriate: medications, allergies, medical history  Review of Systems:  No other skin or systemic complaints except as noted in HPI or Assessment and Plan.  Objective  Well appearing patient in no apparent distress; mood and affect are within normal limits.  A full examination was performed including scalp, head, eyes, ears, nose, lips, neck, chest, axillae, abdomen, back, buttocks, bilateral upper extremities, bilateral lower extremities, hands, feet, fingers, toes, fingernails, and toenails. All findings within normal limits unless otherwise noted below.   Relevant physical exam findings are noted in the Assessment and Plan.  R nasal tip x 1, central nasal tip x 1, R paranasal x 1, R nasal dorsum x 1 (4) Pink scaly macules.  Right Eyebrow Erythematous stuck-on, waxy papule   Assessment & Plan   SKIN CANCER SCREENING PERFORMED TODAY.  ACTINIC DAMAGE WITH PRECANCEROUS ACTINIC KERATOSES - nose Counseling for Topical Chemotherapy Management: Patient exhibits: - Severe, confluent actinic changes with pre-cancerous actinic keratoses that is secondary to cumulative UV radiation exposure over time - Condition that is severe; chronic, not at goal. - diffuse scaly erythematous macules and papules with underlying dyspigmentation - Discussed Prescription "Field Treatment" topical Chemotherapy for Severe,  Chronic Confluent Actinic Changes with Pre-Cancerous Actinic Keratoses Field treatment involves treatment of an entire area of skin that has confluent Actinic Changes (Sun/ Ultraviolet light damage) and PreCancerous Actinic Keratoses by method of PhotoDynamic Therapy (PDT) and/or prescription Topical Chemotherapy agents such as 5-fluorouracil, 5-fluorouracil/calcipotriene, and/or imiquimod.  The purpose is to decrease the number of clinically evident and subclinical PreCancerous lesions to prevent progression to development of skin cancer by chemically destroying early precancer changes that may or may not be visible.  It has been shown to reduce the risk of developing skin cancer in the treated area. As a result of treatment, redness, scaling, crusting, and open sores may occur during treatment course. One or more than one of these methods may be used and may have to be used several times to control, suppress and eliminate the PreCancerous changes. Discussed treatment course, expected reaction, and possible side effects. - Recommend daily broad spectrum sunscreen SPF 30+ to sun-exposed areas, reapply every 2 hours as needed.  - Staying in the shade or wearing long sleeves, sun glasses (UVA+UVB protection) and wide brim hats (4-inch brim around the entire circumference of the hat) are also recommended. - Call for new or changing lesions. - Pt has had Red light treatment to the face in the past.  - Discussed 5FU/Calcipotriene cream to nose this fall. Patient may consider on follow-up.  LENTIGINES, SEBORRHEIC KERATOSES, HEMANGIOMAS - Benign normal skin lesions - Benign-appearing - Call for any changes  MELANOCYTIC NEVI - Tan-brown and/or pink-flesh-colored symmetric macules and papules - Benign appearing on exam today - Observation - Call clinic for new or changing moles - Recommend daily use of  broad spectrum spf 30+ sunscreen to sun-exposed areas.   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  - Call if any new or changing lesions are noted between office visits  AK (ACTINIC KERATOSIS) (4) R nasal tip x 1, central nasal tip x 1, R paranasal x 1, R nasal dorsum x 1 (4) Actinic keratoses are precancerous spots that appear secondary to cumulative UV radiation exposure/sun exposure over time. They are chronic with expected duration over 1 year. A portion of actinic keratoses will progress to squamous cell carcinoma of the skin. It is not possible to reliably predict which spots will progress to skin cancer and so treatment is recommended to prevent development of skin cancer.  Recommend daily broad spectrum sunscreen SPF 30+ to sun-exposed areas, reapply every 2 hours as needed.  Recommend staying in the shade or wearing long sleeves, sun glasses (UVA+UVB protection) and wide brim hats (4-inch brim around the entire circumference of the hat). Call for new or changing lesions. Destruction of lesion - R nasal tip x 1, central nasal tip x 1, R paranasal x 1, R nasal dorsum x 1 (4)  Destruction method: cryotherapy   Informed consent: discussed and consent obtained   Lesion destroyed using liquid nitrogen: Yes   Region frozen until ice ball extended beyond lesion: Yes   Outcome: patient tolerated procedure well with no complications   Post-procedure details: wound care instructions given   Additional details:  Prior to procedure, discussed risks of blister formation, small wound, skin dyspigmentation, or rare scar following cryotherapy. Recommend Vaseline ointment to treated areas while healing.  INFLAMED SEBORRHEIC KERATOSIS Right Eyebrow Symptomatic, irritating, patient would like treated. Destruction of lesion - Right Eyebrow  Destruction method: cryotherapy   Informed consent:  discussed and consent obtained   Lesion destroyed using liquid nitrogen: Yes   Region frozen until ice ball extended beyond lesion: Yes   Outcome: patient tolerated procedure well with no complications   Post-procedure details: wound care instructions given   Additional details:  Prior to procedure, discussed risks of blister formation, small wound, skin dyspigmentation, or rare scar following cryotherapy. Recommend Vaseline ointment to treated areas while healing.  Return in about 6 months (around 05/19/2024) for AKs.  ICherlyn Labella, CMA, am acting as scribe for Willeen Niece, MD .   Documentation: I have reviewed the above documentation for accuracy and completeness, and I agree with the above.  Willeen Niece, MD

## 2023-11-17 NOTE — Patient Instructions (Addendum)

## 2023-11-23 NOTE — Progress Notes (Unsigned)
 Cardiology Office Note  Date:  11/24/2023   ID:  [name], [name] [birth year], MRN [id number]  PCP:  Beth Penton, MD   Chief Complaint  Patient presents with   6 month follow up     Patient c/o shortness of breath with over exertion.     HPI:  [name] is a 83 year old woman with past medical history of Paroxysmal atrial fibrillation Coronary artery disease Essential hypertension Hyperlipidemia Echo July 2023 severe TR with mildly elevated right heart pressures mechanical fall and was found to have R midshaft femoral fracture.  Underwent intramedullary nailing on 03/24/2023.  Hospital course complicated by new onset A-fib with RVR and hyponatremia.  Echo with EF 50% Who presents for follow-up of her atrial fibrillation  LOV 9/24  Reports challenging several months Diagnosed with lung cancer, completed XRT 5 treatments with Dr. Aggie Cosier Repeat scan 8/26  PNA jan 2025, recovered Still feels weak No regular activity or walking program Husband with medical issues, recent stent placement, needs hip surgery  Continued gait instability Did not has any tachycardia or palpitations concerning for arrhythmia Taking metoprolol succinate 25 twice daily No orthostasis symptoms  EKG personally reviewed by myself on todays visit EKG Interpretation Date/Time:  Monday November 24 2023 14:00:44 EDT Ventricular Rate:  80 PR Interval:  124 QRS Duration:  90 QT Interval:  384 QTC Calculation: 442 R Axis:   36  Text Interpretation: Normal sinus rhythm Normal ECG When compared with ECG of 27-May-2023 15:49, No significant change was found Confirmed by Julien Nordmann [phone]) on 11/24/2023 2:06:38 PM   hospitalization after fall 03/24/2023 Fracture femur, required surgery March 23, 2024 Developed atrial fibrillation later in hospitalization,  EKG documenting July 29 with A-fib rate 130s Discharged on Eliquis, metoprolol, rate controlled Low sodium noted throughout her  hospital course  Has completed rehab 2 weeks Home PT, uses a walker, gait improving, no recent falls  Echo March 31, 2023 EF 50% mild MR trivial TR  Prior strong family history of stroke Reports that she stopped the Eliquis 2.5 twice daily when prescription ran out Reduced dose Eliquis for age over 80 and weight less than 60 kg  CHADS VASC 5  Prior ejection fraction 45 to 50% By report prior stress test with no ischemia 2022  Repeat echocardiogram April 01, 2022 Normal left and right ventricular function  Mother with afib, CVA Sister with mini CVA  PMH:   has a past medical history of Actinic keratosis (11/10/2019), Complication of anesthesia, Essential hypertension (03/24/2023), GERD (gastroesophageal reflux disease), History of actinic keratosis (06/15/2020), History of osteoporosis, Hyperlipidemia, PONV (postoperative nausea and vomiting), and 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  PSH:    Past Surgical History:  Procedure Laterality Date   ABDOMINAL HYSTERECTOMY     partial   CARDIAC CATHETERIZATION     [facility]   cataract     COLONOSCOPY WITH PROPOFOL N/A 06/22/2015   Procedure: COLONOSCOPY WITH PROPOFOL;  Surgeon: Wallace Cullens, MD;  Location: [facility] ENDOSCOPY;  Service: Gastroenterology;  Laterality: N/A;   ESOPHAGOGASTRODUODENOSCOPY (EGD) WITH PROPOFOL N/A 06/22/2015   Procedure: ESOPHAGOGASTRODUODENOSCOPY (EGD) WITH PROPOFOL;  Surgeon: Wallace Cullens, MD;  Location: [facility] ENDOSCOPY;  Service: Gastroenterology;  Laterality: N/A;   ESOPHAGOGASTRODUODENOSCOPY (EGD) WITH PROPOFOL N/A 07/12/2020   Procedure: ESOPHAGOGASTRODUODENOSCOPY (EGD) WITH PROPOFOL;  Surgeon: Toledo, Boykin Nearing, MD;  Location: [facility] ENDOSCOPY;  Service: Gastroenterology;  Laterality: N/A;   INTRAMEDULLARY (IM) NAIL INTERTROCHANTERIC Right 03/24/2023   Procedure: INTRAMEDULLARY (IM) NAIL FEMUR;  Surgeon: Roby Lofts, MD;  Location: [facility] OR;  Service: Orthopedics;  Laterality: Right;   PARTIAL HYSTERECTOMY      TONSILLECTOMY      Current Outpatient Medications  Medication Sig Dispense Refill   apixaban (ELIQUIS) 2.5 MG TABS tablet Take 1 tablet (2.5 mg total) by mouth 2 (two) times daily. 60 tablet 6   Denosumab (PROLIA [place]) Inject 1 Dose into the skin every 6 (six) months.     famotidine (PEPCID) 40 MG tablet Take 40 mg by mouth at bedtime.     lovastatin (MEVACOR) 40 MG tablet Take 40 mg by mouth at bedtime.     methocarbamol (ROBAXIN) 500 MG tablet Take 1 tablet (500 mg total) by mouth every 6 (six) hours as needed for muscle spasms. 20 tablet 0   metoprolol succinate (TOPROL-XL) 25 MG 24 hr tablet Take 1 tablet by mouth 2 (two) times daily.     Multiple Vitamin (MULTIVITAMIN) tablet Take 1 tablet by mouth daily.     omeprazole (PRILOSEC) 40 MG capsule Take 40 mg by mouth daily.     polyethylene glycol powder (MIRALAX) 17 GM/SCOOP powder Take 17 g by mouth 2 (two) times daily as needed for moderate constipation.     triamcinolone (KENALOG) 0.025 % ointment Apply 1 Application topically 2 (two) times daily. For 3-5 days. 30 g 0   No current facility-administered medications for this visit.     Allergies:   Bisphosphonates, Boniva [ibandronate], Fosamax [alendronate sodium], Lipitor [atorvastatin], and Etodolac   Social History:  The patient  reports that she has never smoked. She has been exposed to tobacco smoke. She has never used smokeless tobacco. She reports that she does not drink alcohol and does not use drugs.   Family History:   family history includes Breast cancer (age of onset: 55) in her maternal aunt; Coronary artery disease in an other family member; Heart disease in her father; Heart failure in her father and mother; Transient ischemic attack in her mother.    Review of Systems: Review of Systems  Constitutional: Negative.   HENT: Negative.    Respiratory: Negative.    Cardiovascular: Negative.   Gastrointestinal: Negative.   Musculoskeletal: Negative.   Neurological:  Negative.   Psychiatric/Behavioral: Negative.    All other systems reviewed and are negative.  PHYSICAL EXAM: VS:  BP 100/60 (BP Location: Left Arm, Patient Position: Sitting, Cuff Size: Normal)   Pulse 80   Ht 5\' 3"  (1.6 m)   Wt 96 lb (43.5 kg)   SpO2 99%   BMI 17.01 kg/m  , BMI Body mass index is 17.01 kg/m. Constitutional:  oriented to person, place, and time. No distress.  Thin HENT:  Head: Grossly normal Eyes:  no discharge. No scleral icterus.  Neck: No JVD, no carotid bruits  Cardiovascular: Regular rate and rhythm, no murmurs appreciated Pulmonary/Chest: Clear to auscultation bilaterally, no wheezes or rails Abdominal: Soft.  no distension.  no tenderness.  Musculoskeletal: Normal range of motion Neurological:  normal muscle tone. Coordination normal. No atrophy Skin: Skin warm and dry Psychiatric: normal affect, pleasant  Recent Labs: 03/31/2023: TSH 1.573 04/03/2023: ALT 18 04/04/2023: BUN 41; Creatinine, Ser 0.63; Hemoglobin 10.4; Magnesium 1.7; Platelets 475; Potassium 3.5; Sodium 129    Lipid Panel No results found for: "CHOL", "HDL", "LDLCALC", "TRIG"    Wt Readings from Last 3 Encounters:  11/24/23 96 lb (43.5 kg)  10/30/23 94 lb 14.4 oz (43 kg)  10/30/23 95 lb (43.1 kg)  ASSESSMENT AND PLAN:  Problem List Items Addressed This Visit       Cardiology Problems   Essential hypertension   Relevant Orders   EKG 12-Lead (Completed)   Chronic mitral valve regurgitation     Other   Bilateral lower extremity edema   Other Visit Diagnoses       Persistent atrial fibrillation (HCC)    -  Primary   Relevant Orders   EKG 12-Lead (Completed)     Aortic atherosclerosis (HCC)       Relevant Orders   EKG 12-Lead (Completed)     Coronary artery calcification       Relevant Orders   EKG 12-Lead (Completed)     Mixed hyperlipidemia          Paroxysmal atrial fibrillation Documented on hospitalization July 2024,  CHADS VASC 5 Tolerating Eliquis 2.5  twice daily Continue metoprolol succinate 25 twice daily On today's visit remains asymptomatic of any episodes  Essential hypertension Blood pressure typically runs low Asymptomatic, recommend she stay hydrated, call for any orthostasis symptoms May need to decrease metoprolol dosing for orthostasis Weight stable  Hyperlipidemia On lovastatin 40 daily Total cholesterol 180 LDL 96 Followed by primary care  Aortic atherosclerosis Noted on CT scan June 2024, mild to moderate Continue statin as above, could consider adding Zetia  Coronary calcification Noted on CT scan June 2024 Currently with no symptoms of angina. No further workup at this time. Continue current medication regimen. On statin, Eliquis in place of aspirin, beta-blocker    Signed, Dossie Arbour, M.D., Ph.D. [facility] [place], [place] [phone]

## 2023-11-24 ENCOUNTER — Encounter: Payer: Self-pay | Admitting: Cardiovascular Disease

## 2023-11-24 ENCOUNTER — Ambulatory Visit: Payer: Medicare HMO | Attending: Cardiovascular Disease | Admitting: Cardiovascular Disease

## 2023-11-24 VITALS — BP 100/60 | HR 80 | Ht 63.0 in | Wt 96.0 lb

## 2023-11-24 DIAGNOSIS — I10 Essential (primary) hypertension: Secondary | ICD-10-CM

## 2023-11-24 DIAGNOSIS — E782 Mixed hyperlipidemia: Secondary | ICD-10-CM

## 2023-11-24 DIAGNOSIS — I4819 Other persistent atrial fibrillation: Secondary | ICD-10-CM

## 2023-11-24 DIAGNOSIS — I700 Atherosclerosis of aorta: Secondary | ICD-10-CM

## 2023-11-24 DIAGNOSIS — R600 Localized edema: Secondary | ICD-10-CM

## 2023-11-24 DIAGNOSIS — I340 Nonrheumatic mitral (valve) insufficiency: Secondary | ICD-10-CM | POA: Diagnosis not present

## 2023-11-24 DIAGNOSIS — I2510 Atherosclerotic heart disease of native coronary artery without angina pectoris: Secondary | ICD-10-CM

## 2023-11-24 NOTE — Patient Instructions (Signed)

## 2023-12-08 DIAGNOSIS — R5382 Chronic fatigue, unspecified: Secondary | ICD-10-CM | POA: Diagnosis not present

## 2023-12-08 DIAGNOSIS — I2089 Other forms of angina pectoris: Secondary | ICD-10-CM | POA: Diagnosis not present

## 2023-12-08 DIAGNOSIS — R000 Tachycardia, unspecified: Secondary | ICD-10-CM | POA: Diagnosis not present

## 2023-12-08 DIAGNOSIS — C3491 Malignant neoplasm of unspecified part of right bronchus or lung: Secondary | ICD-10-CM | POA: Diagnosis not present

## 2023-12-08 DIAGNOSIS — I4719 Other supraventricular tachycardia: Secondary | ICD-10-CM | POA: Diagnosis not present

## 2023-12-12 DIAGNOSIS — I2089 Other forms of angina pectoris: Secondary | ICD-10-CM | POA: Diagnosis not present

## 2023-12-15 DIAGNOSIS — R000 Tachycardia, unspecified: Secondary | ICD-10-CM | POA: Diagnosis not present

## 2023-12-22 DIAGNOSIS — I4719 Other supraventricular tachycardia: Secondary | ICD-10-CM | POA: Diagnosis not present

## 2023-12-22 DIAGNOSIS — E871 Hypo-osmolality and hyponatremia: Secondary | ICD-10-CM | POA: Diagnosis not present

## 2023-12-23 DIAGNOSIS — M810 Age-related osteoporosis without current pathological fracture: Secondary | ICD-10-CM | POA: Diagnosis not present

## 2023-12-30 DIAGNOSIS — S72301D Unspecified fracture of shaft of right femur, subsequent encounter for closed fracture with routine healing: Secondary | ICD-10-CM | POA: Diagnosis not present

## 2024-01-05 DIAGNOSIS — H906 Mixed conductive and sensorineural hearing loss, bilateral: Secondary | ICD-10-CM | POA: Diagnosis not present

## 2024-01-05 DIAGNOSIS — H6123 Impacted cerumen, bilateral: Secondary | ICD-10-CM | POA: Diagnosis not present

## 2024-01-05 DIAGNOSIS — M272 Inflammatory conditions of jaws: Secondary | ICD-10-CM | POA: Diagnosis not present

## 2024-01-05 DIAGNOSIS — H6061 Unspecified chronic otitis externa, right ear: Secondary | ICD-10-CM | POA: Diagnosis not present

## 2024-01-05 DIAGNOSIS — E871 Hypo-osmolality and hyponatremia: Secondary | ICD-10-CM | POA: Diagnosis not present

## 2024-01-06 DIAGNOSIS — M25651 Stiffness of right hip, not elsewhere classified: Secondary | ICD-10-CM | POA: Diagnosis not present

## 2024-01-06 DIAGNOSIS — M6281 Muscle weakness (generalized): Secondary | ICD-10-CM | POA: Diagnosis not present

## 2024-01-06 DIAGNOSIS — M25561 Pain in right knee: Secondary | ICD-10-CM | POA: Diagnosis not present

## 2024-01-06 DIAGNOSIS — M799 Soft tissue disorder, unspecified: Secondary | ICD-10-CM | POA: Diagnosis not present

## 2024-01-12 ENCOUNTER — Telehealth: Payer: Self-pay | Admitting: Cardiovascular Disease

## 2024-01-12 DIAGNOSIS — M25651 Stiffness of right hip, not elsewhere classified: Secondary | ICD-10-CM | POA: Diagnosis not present

## 2024-01-12 DIAGNOSIS — G8929 Other chronic pain: Secondary | ICD-10-CM | POA: Diagnosis not present

## 2024-01-12 DIAGNOSIS — E871 Hypo-osmolality and hyponatremia: Secondary | ICD-10-CM | POA: Diagnosis not present

## 2024-01-12 DIAGNOSIS — M799 Soft tissue disorder, unspecified: Secondary | ICD-10-CM | POA: Diagnosis not present

## 2024-01-12 DIAGNOSIS — I4719 Other supraventricular tachycardia: Secondary | ICD-10-CM | POA: Diagnosis not present

## 2024-01-12 DIAGNOSIS — M25561 Pain in right knee: Secondary | ICD-10-CM | POA: Diagnosis not present

## 2024-01-12 DIAGNOSIS — M6281 Muscle weakness (generalized): Secondary | ICD-10-CM | POA: Diagnosis not present

## 2024-01-12 NOTE — Telephone Encounter (Signed)
 Pt c/o Shortness Of Breath: STAT if SOB developed within the last 24 hours or pt is noticeably SOB on the phone  1. Are you currently SOB (can you hear that pt is SOB on the phone)?   No  2. How long have you been experiencing SOB?  Since February  3. Are you SOB when sitting or when up moving around?   When up and moving around with exertion  4. Are you currently experiencing any other symptoms?  Overall weakness and low energy  Patient stated she is concerned she has been having  SOB, overall weakness and her energy level has been low for quite a while.

## 2024-01-12 NOTE — Telephone Encounter (Signed)
 Patient with complaint of intermittent shortness of breath with exertion and decreased energy. Denies any swelling. Patient says that she has had they symptoms for several weeks. Patient reports current blood pressure 111/596 and heart rate 72. Patient states that she would like to be seen. Patient scheduled in clinic on 01/13/24.

## 2024-01-13 ENCOUNTER — Ambulatory Visit: Attending: Cardiology | Admitting: Cardiology

## 2024-01-13 ENCOUNTER — Encounter: Payer: Self-pay | Admitting: Cardiology

## 2024-01-13 VITALS — BP 140/72 | HR 67 | Ht 64.0 in | Wt 97.4 lb

## 2024-01-13 DIAGNOSIS — I959 Hypotension, unspecified: Secondary | ICD-10-CM

## 2024-01-13 DIAGNOSIS — R531 Weakness: Secondary | ICD-10-CM | POA: Diagnosis not present

## 2024-01-13 DIAGNOSIS — R0602 Shortness of breath: Secondary | ICD-10-CM

## 2024-01-13 DIAGNOSIS — R5383 Other fatigue: Secondary | ICD-10-CM | POA: Diagnosis not present

## 2024-01-13 DIAGNOSIS — I10 Essential (primary) hypertension: Secondary | ICD-10-CM | POA: Diagnosis not present

## 2024-01-13 DIAGNOSIS — I2510 Atherosclerotic heart disease of native coronary artery without angina pectoris: Secondary | ICD-10-CM

## 2024-01-13 DIAGNOSIS — R0789 Other chest pain: Secondary | ICD-10-CM

## 2024-01-13 DIAGNOSIS — E782 Mixed hyperlipidemia: Secondary | ICD-10-CM

## 2024-01-13 DIAGNOSIS — I480 Paroxysmal atrial fibrillation: Secondary | ICD-10-CM | POA: Diagnosis not present

## 2024-01-13 NOTE — Progress Notes (Signed)
 Cardiology Office Note:  .   Date:  01/13/2024  ID:  [name], DOB February 26, 1941, MRN [id number] PCP: Sari Cunning, MD  [facility] Providers Cardiologist:  None    History of Present Illness: .   [name] is a 83 y.o. female with a past medical history of persistent atrial fibrillation, nonobstructive coronary artery disease, mixed hyperlipidemia, chronic mitral valve regurgitation, bilateral lower extremity edema, chronic hyponatremia, orthostatic hypotension on midodrine , osteoporosis, GERD, who is here today being seen as a work in for worsening shortness of breath.   Patient was previously evaluated in 2009 due to atypical chest discomfort.  Stress testing was abnormal and showed possible inferior wall ischemia.  She underwent cardiac catheterization which showed 30% nonobstructive LAD disease.  She was referred to the [facility] emergency department for evaluation of chest pain in 01/2014.  She had had a recent episode of substernal aching and heartburn sensation was lasted for approximately 2 hours.  Happened the day before arrival.  EKG showed no acute changes.  Labs were unremarkable including negative cardiac enzymes.  Serum creatinine was mildly elevated at 1.42.  Chest x-ray showed no acute abnormalities.  The chest discomfort has been continuous since then but overall was mild.  It did not worsen with physical activities.  She also had a longstanding history of significant heartburn.  She was discharged home on Nexium.  Echocardiogram completed in July 2023 showed severe TR with mildly elevated right heart pressures.  She was hospitalized after mechanical fall in 03/2020 for which she had a femur fracture that required surgery.  She developed atrial fibrillation later during the hospitalization.  EKG documenting A-fib was March 31, 2019 for which she had rates in the 130s.  She was discharged on apixaban , metoprolol , was rate controlled.  She completed 2 weeks in rehab.   Repeat echocardiogram completed March 31, 2023 revealed LVEF 50% with mild MR and trivial TR.   She was last seen in clinic 11/20/2023 by Dr. Gollan.  At that time she had complaints of shortness of breath with exertion.  There were no changes made to her medication regimen and no further testing was ordered at that time.  She returns today being seen as a work in for complaints of being noticeably short of breath.  She states it started in February and is gradually worsened.  She has overall suffering from generalized weakness and low energy.  She is worse today as she is very hard function on echocardiograms.  The range from 50-55 of.  Occasionally mildly decreased function she is unsure as to why.  The last stress testing she stated she had was in 2015.  With ongoing symptoms and frequent medication changes being made she states she feels as though with her worsening shortness of breath and fatigue that there may be something wrong with her heart.  She denies any chest pressure chest tightness she occasionally has a heavy feeling in her chest.  She states that her current medication regimen.  She denies any hospitalizations or visits to the emergency department.  ROS: 10 point review of systems has been reviewed and considered negative except ones been listed in the HPI  Studies Reviewed: Aaron Aas   EKG Interpretation Date/Time:  Tuesday Jan 13 2024 11:20:07 EDT Ventricular Rate:  67 PR Interval:  130 QRS Duration:  90 QT Interval:  394 QTC Calculation: 416 R Axis:   33  Text Interpretation: Normal sinus rhythm Normal ECG When compared with ECG  of 24-Nov-2023 14:00, No significant change was found Confirmed by Ronald Cockayne ([id number]) on 01/13/2024 11:20:58 AM    2D echo ([facility]) 12/12/2023 CONCLUSION -------------------------------------------------------------------------------  NORMAL LEFT VENTRICULAR SYSTOLIC FUNCTION WITH NO LVH  ESTIMATED EF: 55%  NORMAL LA PRESSURES WITH DIASTOLIC DYSFUNCTION  (GRADE 1)  NORMAL RIGHT VENTRICULAR SYSTOLIC FUNCTION  VALVULAR REGURGITATION: TRIVIAL AR, TRIVIAL MR, TRIVIAL PR, MODERATE TR  NO VALVULAR STENOSIS   2D echo 03/31/2023 1. Left ventricular ejection fraction, by estimation, is 50%. The left  ventricle has mildly decreased function. The left ventricle demonstrates  global hypokinesis. Left ventricular diastolic parameters are consistent  with Grade I diastolic dysfunction  (impaired relaxation).   2. Right ventricular systolic function is normal. The right ventricular  size is normal. There is normal pulmonary artery systolic pressure. The  estimated right ventricular systolic pressure is 22.5 mmHg.   3. Left atrial size was mildly dilated.   4. Right atrial size was mildly dilated.   5. The mitral valve is normal in structure. Mild mitral valve  regurgitation. No evidence of mitral stenosis.   6. The aortic valve is tricuspid. There is mild calcification of the  aortic valve. Aortic valve regurgitation is trivial. No aortic stenosis is  present.   7. The inferior vena cava is normal in size with greater than 50%  respiratory variability, suggesting right atrial pressure of 3 mmHg.   2D echo [facility]) 08/15/2022 Summary   1. There is no pericardial effusion.    2. The left ventricle is normal in size with normal wall thickness.    3. The left ventricular systolic function is normal, LVEF is visually  estimated at > 55%.    4. There is mild mitral valve regurgitation.    5. The right ventricle is mildly dilated in size, with normal systolic  function.   6. There is moderate tricuspid regurgitation.    7. IVC size and inspiratory change suggest mildly elevated right atrial  pressure. (5-10 mmHg).  Risk Assessment/Calculations:    CHA2DS2-VASc Score = 3   This indicates a 3.2% annual risk of stroke. The patient's score is based upon: CHF History: 0 HTN History: 0 Diabetes History: 0 Stroke History: 0 Vascular Disease History:  0 Age Score: 2 Gender Score: 1        Physical Exam:   VS:  BP (!) 140/72 (BP Location: Left Arm, Patient Position: Sitting)   Pulse 67   Ht 5\' 4"  (1.626 m)   Wt 97 lb 6.4 oz (44.2 kg)   SpO2 99%   BMI 16.72 kg/m    Wt Readings from Last 3 Encounters:  01/13/24 97 lb 6.4 oz (44.2 kg)  11/24/23 96 lb (43.5 kg)  10/30/23 94 lb 14.4 oz (43 kg)    GEN: Well nourished, well developed in no acute distress NECK: No JVD; No carotid bruits CARDIAC: RRR, no murmurs, rubs, gallops RESPIRATORY:  Clear to auscultation without rales, wheezing or rhonchi  ABDOMEN: Soft, non-tender, non-distended EXTREMITIES:  No edema; No deformity   ASSESSMENT AND PLAN: .   Generalized weakness and worsening fatigue that has been ongoing for the last several months.  She states her primary care provider has been working on changing some of her medications to improve her symptoms but she has not had any resolution in all of her worsening of the symptoms.  Awaiting results of previously worn heart monitor that was placed by her PCP to rule out any pauses, arrhythmia, or AVNRT.  Nonobstructive coronary artery  disease with her last stress testing completed in 2015.  She also continues to have aortic atherosclerosis noted on CT scan from June 2024 there was mild to moderate coronary calcification that was noted.  With her complaints of worsening fatigue, shortness of breath, and heaviness in her chest, she has been scheduled for cardiac PET stress.  Unfortunately she is unable to walk on a treadmill for treadmill testing and a Lexiscan Myoview would give suboptimal pictures since her last Myoview resulted in abnormal testing results which led to her catheterization.  She is continued on apixaban  2.5 mg twice daily and lieu of aspirin  and lovastatin 40 mg daily.  Continued shortness of breath that has been ongoing for the last several months with recent echocardiogram completed by her PCP where he read her EF is 55% and  minimal valvular regurgitation noted in all valves.  Unfortunately we are unable to view these pictures.  Paroxysmal atrial fibrillation where she has noted to be in sinus rhythm on EKG today with a rate of 67 with no acute changes noted.  She is continued on apixaban  2.5 mg twice daily for CHA2DS2-VASc score of at least 3 for stroke prophylaxis.  She is also continued on Toprol -XL 37 and half milligrams twice daily for rate control.  History of primary hypertension with recent episodes of hypotension.  She is on Toprol -XL 37.5 mg twice daily and midodrine  5 mg 2 times per day that she was recently started on by her PCP for uptitration of her Toprol .  She has been encouraged to continue to monitor pressures 1 to 2 hours postmedication administration as well.  Blood pressure today was 140/72.  Hyperlipidemia her last LDL was 96 which is not at goal of 70 or less.  She is continued on the lovastatin 40 mg daily.    Informed Consent   Shared Decision Making/Informed Consent The risks [chest pain, shortness of breath, cardiac arrhythmias, dizziness, blood pressure fluctuations, myocardial infarction, stroke/transient ischemic attack, nausea, vomiting, allergic reaction, radiation exposure, metallic taste sensation and life-threatening complications (estimated to be 1 in 10,000)], benefits (risk stratification, diagnosing coronary artery disease, treatment guidance) and alternatives of a cardiac PET stress test were discussed in detail with Ms. Ayyad and she agrees to proceed.     Dispo: Patient return to clinic to see MD/APP once testing is completed or sooner if needed.  Signed, Eusevio Schriver, NP

## 2024-01-13 NOTE — Patient Instructions (Signed)
 Medication Instructions:  Your Physician recommend you continue on your current medication as directed.    *If you need a refill on your cardiac medications before your next appointment, please call your pharmacy*  Lab Work: No labs ordered today  If you have labs (blood work) drawn today and your tests are completely normal, you will receive your results only by: MyChart Message (if you have MyChart) OR A paper copy in the mail If you have any lab test that is abnormal or we need to change your treatment, we will call you to review the results.  Testing/Procedures: Cardiac PET stress test    Please report to Radiology at [facility] Main Entrance, medical mall, 30 mins prior to your test.  [address]  [place], [place]  How to Prepare for Your Cardiac PET/CT Stress Test:  Nothing to eat or drink, except water, 3 hours prior to arrival time.  NO caffeine/decaffeinated products, or chocolate 12 hours prior to arrival. (Please note decaffeinated beverages (teas/coffees) still contain caffeine).  If you have caffeine within 12 hours prior, the test will need to be rescheduled.  Medication instructions: Do not take erectile dysfunction medications for 72 hours prior to test (sildenafil, tadalafil) Do not take nitrates (isosorbide mononitrate, Ranexa) the day before or day of test Do not take tamsulosin the day before or morning of test Hold theophylline containing medications for 12 hours. Hold Dipyridamole 48 hours prior to the test.  You may take your remaining medications with water.  NO perfume, cologne or lotion on chest or abdomen area. FEMALES - Please avoid wearing dresses to this appointment.  Total time is 1 to 2 hours; you may want to bring reading material for the waiting time.  IF YOU THINK YOU MAY BE PREGNANT, OR ARE NURSING PLEASE INFORM THE TECHNOLOGIST.  In preparation for your appointment, medication and supplies will be purchased.   Appointment availability is limited, so if you need to cancel or reschedule, please call the Radiology Department Scheduler at [phone] 24 hours in advance to avoid a cancellation fee of $100.00  What to Expect When you Arrive:  Once you arrive and check in for your appointment, you will be taken to a preparation room within the Radiology Department.  A technologist or Nurse will obtain your medical history, verify that you are correctly prepped for the exam, and explain the procedure.  Afterwards, an IV will be started in your arm and electrodes will be placed on your skin for EKG monitoring during the stress portion of the exam. Then you will be escorted to the PET/CT scanner.  There, staff will get you positioned on the scanner and obtain a blood pressure and EKG.  During the exam, you will continue to be connected to the EKG and blood pressure machines.  A small, safe amount of a radioactive tracer will be injected in your IV to obtain a series of pictures of your heart along with an injection of a stress agent.    After your Exam:  It is recommended that you eat a meal and drink a caffeinated beverage to counter act any effects of the stress agent.  Drink plenty of fluids for the remainder of the day and urinate frequently for the first couple of hours after the exam.  Your doctor will inform you of your test results within 7-10 business days.  For more information and frequently asked questions, please visit our website: [email or web address]  For questions about your test  or how to prepare for your test, please call: Cardiac Imaging Nurse Navigators Office: [phone]   Follow-Up: At [facility], you and your health needs are our priority.  As part of our continuing mission to provide you with exceptional heart care, our providers are all part of one team.  This team includes your primary Cardiologist (physician) and Advanced Practice Providers or APPs (Physician  Assistants and Nurse Practitioners) who all work together to provide you with the care you need, when you need it.  Your next appointment:   Follow up post Cardiac PET  Provider:   Ronald Cockayne, NP

## 2024-01-15 DIAGNOSIS — M799 Soft tissue disorder, unspecified: Secondary | ICD-10-CM | POA: Diagnosis not present

## 2024-01-15 DIAGNOSIS — M25651 Stiffness of right hip, not elsewhere classified: Secondary | ICD-10-CM | POA: Diagnosis not present

## 2024-01-15 DIAGNOSIS — M25561 Pain in right knee: Secondary | ICD-10-CM | POA: Diagnosis not present

## 2024-01-15 DIAGNOSIS — M810 Age-related osteoporosis without current pathological fracture: Secondary | ICD-10-CM | POA: Diagnosis not present

## 2024-01-15 DIAGNOSIS — M6281 Muscle weakness (generalized): Secondary | ICD-10-CM | POA: Diagnosis not present

## 2024-01-16 DIAGNOSIS — M25651 Stiffness of right hip, not elsewhere classified: Secondary | ICD-10-CM | POA: Diagnosis not present

## 2024-01-16 DIAGNOSIS — M25561 Pain in right knee: Secondary | ICD-10-CM | POA: Diagnosis not present

## 2024-01-16 DIAGNOSIS — M6281 Muscle weakness (generalized): Secondary | ICD-10-CM | POA: Diagnosis not present

## 2024-01-16 DIAGNOSIS — M799 Soft tissue disorder, unspecified: Secondary | ICD-10-CM | POA: Diagnosis not present

## 2024-01-17 ENCOUNTER — Other Ambulatory Visit: Payer: Self-pay

## 2024-01-17 ENCOUNTER — Observation Stay
Admission: EM | Admit: 2024-01-17 | Discharge: 2024-01-18 | Disposition: A | Discharge status: Home / Self Care | Attending: Internal Medicine | Admitting: Internal Medicine

## 2024-01-17 DIAGNOSIS — I10 Essential (primary) hypertension: Secondary | ICD-10-CM | POA: Insufficient documentation

## 2024-01-17 DIAGNOSIS — R002 Palpitations: Principal | ICD-10-CM

## 2024-01-17 DIAGNOSIS — E440 Moderate protein-calorie malnutrition: Secondary | ICD-10-CM | POA: Insufficient documentation

## 2024-01-17 DIAGNOSIS — Z7901 Long term (current) use of anticoagulants: Secondary | ICD-10-CM | POA: Diagnosis not present

## 2024-01-17 DIAGNOSIS — M810 Age-related osteoporosis without current pathological fracture: Secondary | ICD-10-CM | POA: Insufficient documentation

## 2024-01-17 DIAGNOSIS — I480 Paroxysmal atrial fibrillation: Secondary | ICD-10-CM | POA: Diagnosis not present

## 2024-01-17 DIAGNOSIS — I951 Orthostatic hypotension: Secondary | ICD-10-CM | POA: Insufficient documentation

## 2024-01-17 DIAGNOSIS — Z79899 Other long term (current) drug therapy: Secondary | ICD-10-CM | POA: Diagnosis not present

## 2024-01-17 DIAGNOSIS — E222 Syndrome of inappropriate secretion of antidiuretic hormone: Secondary | ICD-10-CM | POA: Diagnosis not present

## 2024-01-17 DIAGNOSIS — Z85828 Personal history of other malignant neoplasm of skin: Secondary | ICD-10-CM | POA: Diagnosis not present

## 2024-01-17 DIAGNOSIS — I2510 Atherosclerotic heart disease of native coronary artery without angina pectoris: Secondary | ICD-10-CM | POA: Insufficient documentation

## 2024-01-17 DIAGNOSIS — T887XXA Unspecified adverse effect of drug or medicament, initial encounter: Secondary | ICD-10-CM | POA: Diagnosis present

## 2024-01-17 DIAGNOSIS — R42 Dizziness and giddiness: Secondary | ICD-10-CM | POA: Insufficient documentation

## 2024-01-17 DIAGNOSIS — Z681 Body mass index (BMI) 19 or less, adult: Secondary | ICD-10-CM | POA: Diagnosis not present

## 2024-01-17 DIAGNOSIS — E871 Hypo-osmolality and hyponatremia: Secondary | ICD-10-CM | POA: Diagnosis present

## 2024-01-17 LAB — SODIUM
Sodium: 125 mmol/L — ABNORMAL LOW (ref 135–145)
Sodium: 127 mmol/L — ABNORMAL LOW (ref 135–145)

## 2024-01-17 LAB — CBC WITH DIFFERENTIAL/PLATELET
Abs Immature Granulocytes: 0.02 10*3/uL (ref 0.00–0.07)
Basophils Absolute: 0 10*3/uL (ref 0.0–0.1)
Basophils Relative: 1 %
Eosinophils Absolute: 0 10*3/uL (ref 0.0–0.5)
Eosinophils Relative: 0 %
HCT: 33.5 % — ABNORMAL LOW (ref 36.0–46.0)
Hemoglobin: 11.3 g/dL — ABNORMAL LOW (ref 12.0–15.0)
Immature Granulocytes: 0 %
Lymphocytes Relative: 21 %
Lymphs Abs: 1.2 10*3/uL (ref 0.7–4.0)
MCH: 31.7 pg (ref 26.0–34.0)
MCHC: 33.7 g/dL (ref 30.0–36.0)
MCV: 93.8 fL (ref 80.0–100.0)
Monocytes Absolute: 0.4 10*3/uL (ref 0.1–1.0)
Monocytes Relative: 7 %
Neutro Abs: 4.2 10*3/uL (ref 1.7–7.7)
Neutrophils Relative %: 71 %
Platelets: 176 10*3/uL (ref 150–400)
RBC: 3.57 MIL/uL — ABNORMAL LOW (ref 3.87–5.11)
RDW: 12.6 % (ref 11.5–15.5)
WBC: 5.9 10*3/uL (ref 4.0–10.5)
nRBC: 0 % (ref 0.0–0.2)

## 2024-01-17 LAB — OSMOLALITY, URINE: Osmolality, Ur: 278 mosm/kg — ABNORMAL LOW (ref 300–900)

## 2024-01-17 LAB — BASIC METABOLIC PANEL WITH GFR
Anion gap: 7 (ref 5–15)
BUN: 17 mg/dL (ref 8–23)
CO2: 27 mmol/L (ref 22–32)
Calcium: 9.5 mg/dL (ref 8.9–10.3)
Chloride: 91 mmol/L — ABNORMAL LOW (ref 98–111)
Creatinine, Ser: 0.55 mg/dL (ref 0.44–1.00)
GFR, Estimated: >60 mL/min (ref >60)
Glucose, Bld: 112 mg/dL — ABNORMAL HIGH (ref 70–99)
Potassium: 3.9 mmol/L (ref 3.5–5.1)
Sodium: 125 mmol/L — ABNORMAL LOW (ref 135–145)

## 2024-01-17 LAB — OSMOLALITY: Osmolality: 273 mosm/kg — ABNORMAL LOW (ref 275–295)

## 2024-01-17 LAB — SODIUM, URINE, RANDOM: Sodium, Ur: 50 mmol/L

## 2024-01-17 LAB — TSH: TSH: 0.889 u[IU]/mL (ref 0.350–4.500)

## 2024-01-17 LAB — TROPONIN I (HIGH SENSITIVITY)
Troponin I (High Sensitivity): 6 ng/L (ref <18)
Troponin I (High Sensitivity): 6 ng/L (ref <18)

## 2024-01-17 MED ORDER — METOPROLOL SUCCINATE ER 25 MG PO TB24
25.0000 mg | ORAL_TABLET | Freq: Two times a day (BID) | ORAL | Status: DC
  Administered 2024-01-17 – 2024-01-18 (×2): 25 mg via ORAL
  Filled 2024-01-17 (×2): qty 1

## 2024-01-17 MED ORDER — METHOCARBAMOL 500 MG PO TABS: 500.0000 mg | ORAL_TABLET | Freq: Four times a day (QID) | ORAL | Status: DC | PRN

## 2024-01-17 MED ORDER — ONDANSETRON HCL 4 MG/2ML IJ SOLN: 4.0000 mg | Freq: Four times a day (QID) | INTRAMUSCULAR | Status: DC | PRN

## 2024-01-17 MED ORDER — SODIUM CHLORIDE 1 G PO TABS
1.0000 g | ORAL_TABLET | Freq: Three times a day (TID) | ORAL | Status: DC
  Administered 2024-01-17 – 2024-01-18 (×4): 1 g via ORAL
  Filled 2024-01-17 (×4): qty 1

## 2024-01-17 MED ORDER — APIXABAN 2.5 MG PO TABS
2.5000 mg | ORAL_TABLET | Freq: Two times a day (BID) | ORAL | Status: DC
  Administered 2024-01-17 – 2024-01-18 (×2): 2.5 mg via ORAL
  Filled 2024-01-17 (×2): qty 1

## 2024-01-17 MED ORDER — MIDODRINE HCL 5 MG PO TABS
5.0000 mg | ORAL_TABLET | Freq: Two times a day (BID) | ORAL | Status: DC
  Administered 2024-01-17 – 2024-01-18 (×2): 5 mg via ORAL
  Filled 2024-01-17 (×2): qty 1

## 2024-01-17 MED ORDER — POLYETHYLENE GLYCOL 3350 17 GM/SCOOP PO POWD: 17.0000 g | Freq: Two times a day (BID) | ORAL | Status: DC | PRN

## 2024-01-17 MED ORDER — FAMOTIDINE 20 MG PO TABS
40.0000 mg | ORAL_TABLET | Freq: Every day | ORAL | Status: DC
  Administered 2024-01-17: 40 mg via ORAL
  Filled 2024-01-17: qty 2

## 2024-01-17 MED ORDER — PRAVASTATIN SODIUM 20 MG PO TABS
40.0000 mg | ORAL_TABLET | Freq: Every day | ORAL | Status: DC
  Administered 2024-01-17: 40 mg via ORAL
  Filled 2024-01-17: qty 2

## 2024-01-17 MED ORDER — ONDANSETRON HCL 4 MG PO TABS: 4.0000 mg | ORAL_TABLET | Freq: Four times a day (QID) | ORAL | Status: DC | PRN

## 2024-01-17 MED ORDER — SODIUM CHLORIDE 1 G PO TABS: 1.0000 g | ORAL_TABLET | Freq: Two times a day (BID) | ORAL | Status: DC

## 2024-01-17 MED ORDER — ACETAMINOPHEN 325 MG PO TABS: 650.0000 mg | ORAL_TABLET | Freq: Four times a day (QID) | ORAL | Status: DC | PRN

## 2024-01-17 MED ORDER — PANTOPRAZOLE SODIUM 40 MG PO TBEC
40.0000 mg | DELAYED_RELEASE_TABLET | Freq: Every day | ORAL | Status: DC
  Administered 2024-01-17 – 2024-01-18 (×2): 40 mg via ORAL
  Filled 2024-01-17 (×2): qty 1

## 2024-01-17 MED ORDER — ACETAMINOPHEN 650 MG RE SUPP: 650.0000 mg | Freq: Four times a day (QID) | RECTAL | Status: DC | PRN

## 2024-01-17 NOTE — ED Notes (Addendum)
 Pt took a medication called Tymlos self inject for osteoporosis around 1930 last night. 45 mins after taking this medication pt started experiencing low BP, racing heart, and lightheadedness. Pt denies CP or SOB at this time. A&Ox4. Pt denies any other changes to meds. Pt states he BP is normally low at baseline. Current pressure is 152/82. No site changes where medication was administered.

## 2024-01-17 NOTE — H&P (Addendum)
 History and Physical    [name] RUE:[id number] DOB: 09-Jul-1941 DOA: 01/17/2024  PCP: Sari Cunning, MD (Confirm with patient/family/NH records and if not entered, this has to be entered at [facility] point of entry) Patient coming from: Home  I have personally briefly reviewed patient's old medical records in [facility]  Chief Complaint: Generalized weakness, lightheadedness  HPI: [name] is a 83 y.o. female with medical history significant of chronic hyponatremia on sodium chloride  tablet, HTN, PAF on Eliquis , osteoporosis, presented with worsening of generalized weakness and lightheadedness.  Patient has a history of chronic hyponatremia for which she has been following with her PCP.  Several months ago, it was found she has been having worsening of hyponatremia and patient was started on sodium tablet initially was 2 g daily and several weeks ago increased to 3 g daily.  Patient also has been feeling fluid restriction, she drinks about 3 glasses of water a day +1 to 2 cups of tea on coffee estimated less than half a gallon a day.  This week, patient was started on injections daily Tymlos, and so far she has received 2 injections.  Soon after her injection she started to have frequent episodes of lightheadedness usually when she stands up or rise up but denied any fall.  She reported somewhat increased of nocturnal urination, denied any dysuria back pain, no fever or chills.  ED Course: Afebrile, no tachycardia blood pressure 150/78.  Blood work showed sodium 125 compared to last 136 2 weeks ago, WBC 5.9 hemoglobin 11.3 BUN 17 creatinine 0.5.  Review of Systems: As per HPI otherwise 14 point review of systems negative.    Past Medical History:  Diagnosis Date   Actinic keratosis 11/10/2019   Left superior shoulder. Bx proven.   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    Past Surgical History:  Procedure Laterality Date   ABDOMINAL HYSTERECTOMY     partial   CARDIAC CATHETERIZATION     [facility]   cataract     COLONOSCOPY WITH PROPOFOL  N/A 06/22/2015   Procedure: COLONOSCOPY WITH PROPOFOL ;  Surgeon: Stephens Eis, MD;  Location: [facility];  Service: Gastroenterology;  Laterality: N/A;   ESOPHAGOGASTRODUODENOSCOPY (EGD) WITH PROPOFOL  N/A 06/22/2015   Procedure: ESOPHAGOGASTRODUODENOSCOPY (EGD) WITH PROPOFOL ;  Surgeon: Stephens Eis, MD;  Location: [facility] ENDOSCOPY;  Service: Gastroenterology;  Laterality: N/A;   ESOPHAGOGASTRODUODENOSCOPY (EGD) WITH PROPOFOL  N/A 07/12/2020   Procedure: ESOPHAGOGASTRODUODENOSCOPY (EGD) WITH PROPOFOL ;  Surgeon: Toledo, Alphonsus Jeans, MD;  Location: [facility] ENDOSCOPY;  Service: Gastroenterology;  Laterality: N/A;   INTRAMEDULLARY (IM) NAIL INTERTROCHANTERIC Right 03/24/2023   Procedure: INTRAMEDULLARY (IM) NAIL FEMUR;  Surgeon: Laneta Pintos, MD;  Location: [facility] OR;  Service: Orthopedics;  Laterality: Right;   PARTIAL HYSTERECTOMY     TONSILLECTOMY       reports that she has never smoked. She has been exposed to tobacco smoke. She has never used smokeless tobacco. She reports that she does not drink alcohol and does not use drugs.  Allergies  Allergen Reactions   Bisphosphonates Other (See Comments)    Swallowing problems    Boniva [Ibandronate] Other (See Comments)  Swallowing problems    Fosamax [Alendronate Sodium] Other (See Comments)    Swallowing problems    Lipitor [Atorvastatin] Other (See Comments)    Knee pain    Etodolac Rash    Family History  Problem Relation Age of Onset   Heart failure Mother        CHF   Transient ischemic attack Mother    Heart disease Father    Heart failure Father    Coronary artery  disease Other    Breast cancer Maternal Aunt 20     Prior to Admission medications   Medication Sig Start Date End Date Taking? Authorizing Provider  apixaban  (ELIQUIS ) 2.5 MG TABS tablet Take 1 tablet (2.5 mg total) by mouth 2 (two) times daily. 05/27/23   Gollan, Timothy J, MD  Denosumab (PROLIA Starbrick) Inject 1 Dose into the skin every 6 (six) months.    [provider]  famotidine (PEPCID) 40 MG tablet Take 40 mg by mouth at bedtime. 09/26/21   [provider]  lovastatin (MEVACOR) 40 MG tablet Take 40 mg by mouth at bedtime.    [provider]  methocarbamol  (ROBAXIN ) 500 MG tablet Take 1 tablet (500 mg total) by mouth every 6 (six) hours as needed for muscle spasms. 03/26/23   Versie Gores, PA-C  metoprolol  succinate (TOPROL -XL) 25 MG 24 hr tablet Take 1 tablet by mouth 2 (two) times daily. 04/23/23   [provider]  midodrine  (PROAMATINE ) 5 MG tablet Take 1 tablet by mouth 2 (two) times daily. 12/08/23 12/07/24  [provider]  Multiple Vitamin (MULTIVITAMIN) tablet Take 1 tablet by mouth daily.    [provider]  omeprazole (PRILOSEC) 40 MG capsule Take 40 mg by mouth daily.    [provider]  polyethylene glycol powder (MIRALAX ) 17 GM/SCOOP powder Take 17 g by mouth 2 (two) times daily as needed for moderate constipation. 04/04/23   Gonfa, Taye T, MD  triamcinolone  (KENALOG ) 0.025 % ointment Apply 1 Application topically 2 (two) times daily. For 3-5 days. 09/19/22   Moye, [place] , MD    Physical Exam: Vitals:   01/17/24 0801  BP: (!) 150/78  Pulse: 89  Resp: 16  Temp: 97.7 F (36.5 C)  TempSrc: Oral  SpO2: 100%    Constitutional: NAD, calm, comfortable Vitals:   01/17/24 0801  BP: (!) 150/78  Pulse: 89  Resp: 16  Temp: 97.7 F (36.5 C)  TempSrc: Oral  SpO2: 100%   Eyes: PERRL, lids and conjunctivae normal ENMT: Mucous membranes are moist. Posterior pharynx clear of any exudate or lesions.Normal dentition.   Neck: normal, supple, no masses, no thyromegaly Respiratory: clear to auscultation bilaterally, no wheezing, no crackles. Normal respiratory effort. No accessory muscle use.  Cardiovascular: Regular rate and rhythm, no murmurs / rubs / gallops. No extremity edema. 2+ pedal pulses. No carotid bruits.  Abdomen: no tenderness, no masses palpated. No hepatosplenomegaly. Bowel sounds positive.  Musculoskeletal: no clubbing / cyanosis. No joint deformity upper and lower extremities. Good ROM, no contractures. Normal muscle tone.  Skin: no rashes, lesions, ulcers. No induration Neurologic: CN 2-12 grossly intact. Sensation intact, DTR normal. Strength 5/5 in all 4.  Psychiatric: Normal judgment and insight. Alert and oriented x 3. Normal mood.     Labs on Admission: I have personally reviewed following labs and imaging studies  CBC: Recent Labs  Lab 01/17/24 0823  WBC 5.9  NEUTROABS 4.2  HGB 11.3*  HCT 33.5*  MCV 93.8  PLT 176  Basic Metabolic Panel: Recent Labs  Lab 01/17/24 0823  NA 125*  K 3.9  CL 91*  CO2 27  GLUCOSE 112*  BUN 17  CREATININE 0.55  CALCIUM  9.5   GFR: Estimated Creatinine Clearance: 37.2 mL/min (by C-G formula based on SCr of 0.55 mg/dL). Liver Function Tests: No results for input(s): "AST", "ALT", "ALKPHOS", "BILITOT", "PROT", "ALBUMIN " in the last 168 hours. No results for input(s): "LIPASE", "AMYLASE" in the last 168 hours. No results for input(s): "AMMONIA" in the last 168 hours. Coagulation Profile: No results for input(s): "INR", "PROTIME" in the last 168 hours. Cardiac Enzymes: No results for input(s): "CKTOTAL", "CKMB", "CKMBINDEX", "TROPONINI" in the last 168 hours. BNP (last 3 results) No results for input(s): "PROBNP" in the last 8760 hours. HbA1C: No results for input(s): "HGBA1C" in the last 72 hours. CBG: No results for input(s): "GLUCAP" in the last 168 hours. Lipid Profile: No results for input(s): "CHOL", "HDL", "LDLCALC", "TRIG",  "CHOLHDL", "LDLDIRECT" in the last 72 hours. Thyroid  Function Tests: No results for input(s): "TSH", "T4TOTAL", "FREET4", "T3FREE", "THYROIDAB" in the last 72 hours. Anemia Panel: No results for input(s): "VITAMINB12", "FOLATE", "FERRITIN", "TIBC", "IRON", "RETICCTPCT" in the last 72 hours. Urine analysis: No results found for: "COLORURINE", "APPEARANCEUR", "LABSPEC", "PHURINE", "GLUCOSEU", "HGBUR", "BILIRUBINUR", "KETONESUR", "PROTEINUR", "UROBILINOGEN", "NITRITE", "LEUKOCYTESUR"  Radiological Exams on Admission: No results found.  EKG: Independently reviewed.  Sinus rhythm, no acute ST changes.  Assessment/Plan Principal Problem:   Hyponatremia due to SIADH Active Problems:   Dizzy spells  (please populate well all problems here in Problem List. (For example, if patient is on BP meds at home and you resume or decide to hold them, it is a problem that needs to be her. Same for CAD, COPD, HLD and so on)  Acute on chronic hyponatremia - Worsening of sodium level despite on sodium supplement.  Etiology appears to be a SIADH patient is required sodium tablet for at least 2+ months already. - Review of her records showed there is no previous hyponatremia study.  Will send TSH, osmolality, urine osmolality and urine sodium level. - Continue sodium tablet 2 g daily - Fluid restriction - Recheck sodium level this afternoon and evening.  Near syncope - Euvolemic, etiology is likely related to side effect of recently started Tymlos injection for severe osteoporosis. - Check orthostatic signs - Defer discussion of whether to continue Tymlos or not to PCP -PT evaluation  Orthostatic hypotension - Continue midodrine   PAF - Sinus rhythm - Continue metoprolol  and Eliquis   Moderate to severe protein calorie malnutrition - BMI= 16, and patient already on protein shakes.  DVT prophylaxis: Eliquis  Code Status: Full Family Communication: Husband at bedside Disposition Plan: Expect less  than 2 midnight hospital stay Consults called: None Admission status: Telemetry observation   [place] MD [facility] Pager [phone]  01/17/2024, 10:16 AM

## 2024-01-17 NOTE — ED Notes (Signed)
 Pt ambulatory to the bathroom. Stand by assist provided. Gait steady.

## 2024-01-17 NOTE — ED Triage Notes (Signed)
 Pt stated new medication and feels she is having a reaction to it. States she is having dizziness, BP is low, headache, and rapid heart rate. Pt started the medication last night. Today she is just feeling shaky.

## 2024-01-17 NOTE — Plan of Care (Signed)

## 2024-01-17 NOTE — Evaluation (Signed)
 Physical Therapy Evaluation Patient Details Name: [name] MRN: [id number] DOB: 09-16-1940 Today's Date: 01/17/2024  History of Present Illness  83 y/o female presented to ED on 01/17/24 for dizziness, hypotension, headache, and rapid heart rate. PMH: HTN, PAF on Eliquis , osteoporosis.  Clinical Impression  Patient admitted with the above. PTA, patient lives with husband and was independent with mobility with no AD. Patient currently requiring supervision for ambulation with HHAx1 due to feeling unsteady although no LOB noted. Encouraged patient to utilize RW or [facility] at home for support until feeling closer to her baseline, patient verbalized understanding. Orthostatics negative, see below. No further skilled PT needs identified acutely. Recommend return to OPPT at discharge.   Orthostatic BPs  Supine 117/57  Sitting 127/68  Standing 112/67  Standing after ambulation  133/65  Sitting  154/73      If plan is discharge home, recommend the following: Help with stairs or ramp for entrance;Assist for transportation;Assistance with cooking/housework   Can travel by private vehicle        Equipment Recommendations None recommended by PT  Recommendations for Other Services       Functional Status Assessment Patient has had a recent decline in their functional status and demonstrates the ability to make significant improvements in function in a reasonable and predictable amount of time.     Precautions / Restrictions Precautions Precautions: Fall Recall of Precautions/Restrictions: Intact Restrictions Weight Bearing Restrictions Per Provider Order: No      Mobility  Bed Mobility Overal bed mobility: Modified Independent                  Transfers Overall transfer level: Modified independent Equipment used: None                    Ambulation/Gait Ambulation/Gait assistance: Supervision Gait Distance (Feet): 120 Feet Assistive device: 1 person hand  held assist Gait Pattern/deviations: Step-through pattern, Decreased stride length Gait velocity: decreased     General Gait Details: supervision for safety. HHAx1 for comfort due to patient feeling unsteady  Stairs            Wheelchair Mobility     Tilt Bed    Modified Rankin (Stroke Patients Only)       Balance Overall balance assessment: Mild deficits observed, not formally tested                                           Pertinent Vitals/Pain Pain Assessment Pain Assessment: No/denies pain    Home Living Family/patient expects to be discharged to:: Private residence Living Arrangements: Spouse/significant other Available Help at Discharge: Family;Available 24 hours/day Type of Home: House Home Access: Stairs to enter Entrance Stairs-Rails: Left Entrance Stairs-Number of Steps: 2   Home Layout: One level Home Equipment: Rollator (4 wheels);Wheelchair - manual;Grab bars - tub/shower;Shower seat      Prior Function Prior Level of Function : Independent/Modified Independent             Mobility Comments: no AD       Extremity/Trunk Assessment   Upper Extremity Assessment Upper Extremity Assessment: Generalized weakness    Lower Extremity Assessment Lower Extremity Assessment: Generalized weakness       Communication   Communication Communication: No apparent difficulties    Cognition Arousal: Alert Behavior During Therapy: WFL for tasks assessed/performed   PT - Cognitive impairments: No  apparent impairments                         Following commands: Intact       Cueing       General Comments      Exercises     Assessment/Plan    PT Assessment Patient does not need any further PT services  PT Problem List         PT Treatment Interventions      PT Goals (Current goals can be found in the Care Plan section)  Acute Rehab PT Goals Patient Stated Goal: to go home PT Goal Formulation: All  assessment and education complete, DC therapy    Frequency       Co-evaluation               AM-PAC PT "6 Clicks" Mobility  Outcome Measure Help needed turning from your back to your side while in a flat bed without using bedrails?: None Help needed moving from lying on your back to sitting on the side of a flat bed without using bedrails?: None Help needed moving to and from a bed to a chair (including a wheelchair)?: None Help needed standing up from a chair using your arms (e.g., wheelchair or bedside chair)?: None Help needed to walk in hospital room?: A Little Help needed climbing 3-5 steps with a railing? : A Little 6 Click Score: 22    End of Session   Activity Tolerance: Patient tolerated treatment well Patient left: in bed;with call bell/phone within reach;with family/visitor present Nurse Communication: Mobility status PT Visit Diagnosis: Muscle weakness (generalized) (M62.81)    Time: 1610-9604 PT Time Calculation (min) (ACUTE ONLY): 15 min   Charges:   PT Evaluation $PT Eval Moderate Complexity: 1 Mod   PT General Charges $$ ACUTE PT VISIT: 1 Visit         Janine Melbourne, PT, DPT Physical Therapist - [facility]  [facility]   Charnese Federici A Arturo Freundlich 01/17/2024, 3:01 PM

## 2024-01-17 NOTE — ED Notes (Signed)
 Pt ambulated to the bathroom at this time. Gait steady. Stand-by assist provided.

## 2024-01-17 NOTE — ED Provider Notes (Signed)
 [facility] Provider Note    Event Date/Time   First MD Initiated Contact with Patient 01/17/24 [phone]     (approximate)   History   Chief Complaint Allergic Reaction   HPI  NORVA BOWE is a 83 y.o. female with past medical history of hypotension on midodrine , hyperlipidemia, nonobstructive CAD, atrial fibrillation on Eliquis , and GERD who presents to the ED complaining of medication reaction.  Patient reports that yesterday evening she administered an injection of Tymlos to her abdomen, about 30 minutes later began feeling dizzy and lightheaded with some palpitations.  She states that her blood pressure dropped as low as the 70s systolic at that time, but subsequently improved and she was able to go to sleep.  She denies any associated chest pain or shortness of breath, but has continued to feel dizzy with palpitations this morning.  When she checked her blood pressure this morning, she was concerned that it was elevated into the 150s systolic, which she states is quite high for her.  She did take her usual dose of midodrine  this morning.  She denies any rash, nausea, vomiting, diarrhea, or difficulty breathing.     Physical Exam   Triage Vital Signs: ED Triage Vitals  Encounter Vitals Group     BP 01/17/24 0801 (!) 150/78     Systolic BP Percentile --      Diastolic BP Percentile --      Pulse Rate 01/17/24 0801 89     Resp 01/17/24 0801 16     Temp 01/17/24 0801 97.7 F (36.5 C)     Temp Source 01/17/24 0801 Oral     SpO2 01/17/24 0801 100 %     Weight --      Height --      Head Circumference --      Peak Flow --      Pain Score 01/17/24 0802 0     Pain Loc --      Pain Education --      Exclude from Growth Chart --     Most recent vital signs: Vitals:   01/17/24 0801  BP: (!) 150/78  Pulse: 89  Resp: 16  Temp: 97.7 F (36.5 C)  SpO2: 100%    Constitutional: Alert and oriented. Eyes: Conjunctivae are normal. Head:  Atraumatic. Nose: No congestion/rhinnorhea. Mouth/Throat: Mucous membranes are moist.  Cardiovascular: Normal rate, regular rhythm. Grossly normal heart sounds.  2+ radial pulses bilaterally. Respiratory: Normal respiratory effort.  No retractions. Lungs CTAB. Gastrointestinal: Soft and nontender. No distention. Musculoskeletal: No lower extremity tenderness nor edema.  Neurologic:  Normal speech and language. No gross focal neurologic deficits are appreciated.    ED Results / Procedures / Treatments   Labs (all labs ordered are listed, but only abnormal results are displayed) Labs Reviewed  CBC WITH DIFFERENTIAL/PLATELET - Abnormal; Notable for the following components:      Result Value   RBC 3.57 (*)    Hemoglobin 11.3 (*)    HCT 33.5 (*)    All other components within normal limits  BASIC METABOLIC PANEL WITH GFR - Abnormal; Notable for the following components:   Sodium 125 (*)    Chloride 91 (*)    Glucose, Bld 112 (*)    All other components within normal limits  TROPONIN I (HIGH SENSITIVITY)  TROPONIN I (HIGH SENSITIVITY)     EKG  ED ECG REPORT I, Twilla Galea, the attending physician, personally viewed and interpreted this ECG.  Date: 01/17/2024  EKG Time: 8:18  Rate: 82  Rhythm: normal sinus rhythm  Axis: Normal  Intervals:none  ST&T Change: None  PROCEDURES:  Critical Care performed: Yes, see critical care procedure note(s)  .Critical Care  Performed by: Twilla Galea, MD Authorized by: Twilla Galea, MD   Critical care provider statement:    Critical care time (minutes):  30   Critical care time was exclusive of:  Separately billable procedures and treating other patients and teaching time   Critical care was time spent personally by me on the following activities:  Development of treatment plan with patient or surrogate, discussions with consultants, evaluation of patient's response to treatment, examination of patient, ordering and review  of laboratory studies, ordering and review of radiographic studies, ordering and performing treatments and interventions, pulse oximetry, re-evaluation of patient's condition and review of old charts    MEDICATIONS ORDERED IN ED: Medications - No data to display   IMPRESSION / MDM / ASSESSMENT AND PLAN / ED COURSE  I reviewed the triage vital signs and the nursing notes.                              83 y.o. female with past medical history of hypotension on midodrine , hyperlipidemia, nonobstructive CAD, atrial fibrillation on Eliquis , and GERD who presents to the ED complaining of palpitations, dizziness, and feeling shaky after a dose of Tymlos last night.  Patient's presentation is most consistent with acute presentation with potential threat to life or bodily function.  Differential diagnosis includes, but is not limited to, arrhythmia, ACS, anemia, lecture light abnormality, AKI, anxiety, medication side effect, allergic reaction.  Patient nontoxic-appearing and in no acute distress, vital signs are unremarkable.  No symptoms to suggest anaphylaxis or other allergic reaction.  I did review side effects of Tymlos, which is known to cause dizziness, palpitations, and fatigue as described by the patient.  We will screen EKG and labs, observe on cardiac monitor.  BP reassuring at this time at 150/78.  Labs without significant anemia, leukocytosis, or AKI, but patient does have acute on chronic hyponatremia with sodium of 125.  This does appear to be an acute drop from labs 12 days ago, when it was 136.  She states she has been compliant with salt tablets prescribed by her PCP.  Troponin within normal limits, no concerns for cardiac issue at this time.  She would benefit from admission for further management of hyponatremia, case discussed with hospitalist for admission.      FINAL CLINICAL IMPRESSION(S) / ED DIAGNOSES   Final diagnoses:  Palpitations  Hyponatremia     Rx / DC Orders    ED Discharge Orders     None        Note:  This document was prepared using Dragon voice recognition software and may include unintentional dictation errors.   Twilla Galea, MD 01/17/24 [phone]

## 2024-01-17 NOTE — ED Notes (Signed)
 Patient transferred to the toilet with 1-assist.

## 2024-01-18 DIAGNOSIS — E871 Hypo-osmolality and hyponatremia: Secondary | ICD-10-CM | POA: Diagnosis not present

## 2024-01-18 LAB — BASIC METABOLIC PANEL WITH GFR
Anion gap: 8 (ref 5–15)
BUN: 13 mg/dL (ref 8–23)
CO2: 29 mmol/L (ref 22–32)
Calcium: 9.4 mg/dL (ref 8.9–10.3)
Chloride: 93 mmol/L — ABNORMAL LOW (ref 98–111)
Creatinine, Ser: 0.52 mg/dL (ref 0.44–1.00)
GFR, Estimated: >60 mL/min (ref >60)
Glucose, Bld: 91 mg/dL (ref 70–99)
Potassium: 3.9 mmol/L (ref 3.5–5.1)
Sodium: 130 mmol/L — ABNORMAL LOW (ref 135–145)

## 2024-01-18 NOTE — TOC Initial Note (Signed)
 Transition of Care [facility]) - Initial/Assessment Note    Patient Details  Name: [name] MRN: [id number] Date of Birth: [birth year]  Transition of Care [facility]) CM/SW Contact:    [place] L Emberlin Verner, LCSW Phone Number: 01/18/2024, 12:13 PM  Clinical Narrative:                  CSW met with patient to complete [facility] consult. Patient advised that she will be discharged today. Patient stated that she goes to [facility] for PT services. Pt resides with spouse, Karema Tocci, who was also at bedside.        Patient Goals and CMS Choice            Expected Discharge Plan and Services         Expected Discharge Date: 01/18/24                                    Prior Living Arrangements/Services     Patient language and need for interpreter reviewed:: Yes (English) Do you feel safe going back to the place where you live?: Yes      Need for Family Participation in Patient Care: Yes (Comment) Care giver support system in place?: Yes (comment)      Activities of Daily Living   ADL Screening (condition at time of admission) Independently performs ADLs?: Yes (appropriate for developmental age) Is the patient deaf or have difficulty hearing?: No Does the patient have difficulty seeing, even when wearing glasses/contacts?: No Does the patient have difficulty concentrating, remembering, or making decisions?: No  Permission Sought/Granted      Share Information with NAME: Mariapaula Krist     Permission granted to share info w Relationship: Spouse and Son     Emotional Assessment Appearance:: Appears stated age Attitude/Demeanor/Rapport: Engaged Affect (typically observed): Calm Orientation: : Oriented to Self, Oriented to Place, Oriented to  Time Alcohol / Substance Use: Never Used Psych Involvement: No (comment)  Admission diagnosis:  Palpitations [R00.2] Hyponatremia [E87.1] Patient Active Problem List   Diagnosis Date Noted   Dizzy spells 01/17/2024    Paroxysmal atrial fibrillation (HCC) 03/31/2023   Vitamin D  deficiency 03/26/2023   Normocytic anemia 03/25/2023   Right femoral shaft fracture (HCC) 03/24/2023   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   Essential hypertension 03/24/2023   Chronic mitral valve regurgitation 03/24/2023   Bilateral lower extremity edema 03/24/2023   GERD (gastroesophageal reflux disease) 02/11/2014   Dyslipidemia 03/06/2010   CAD, NATIVE VESSEL 03/06/2010   PCP:  Sari Cunning, MD Pharmacy:   [facility] [id number] [place], [place] - [address] [place] [address] Phone: [phone] Fax: [phone]     Social Drivers of Health (SDOH) Social History: SDOH Screenings   Food Insecurity: No Food Insecurity (01/17/2024)  Housing: Low Risk  (01/17/2024)  Transportation Needs: No Transportation Needs (01/17/2024)  Utilities: Not At Risk (01/17/2024)  Financial Resource Strain: Low Risk  (10/21/2023)   Received from [facility]  Social Connections: Socially Integrated (01/17/2024)  Tobacco Use: Low Risk  (01/17/2024)   SDOH Interventions:     Readmission Risk Interventions     No data to display

## 2024-01-18 NOTE — Progress Notes (Signed)
 Mobility Specialist - Progress Note   01/18/24 1020  Mobility  Activity Ambulated independently in hallway;Stood at bedside;Dangled on edge of bed  Level of Assistance Independent  Assistive Device Front wheel walker  Distance Ambulated (ft) 200 ft  Activity Response Tolerated well  Mobility Referral Yes  Mobility visit 1 Mobility  Mobility Specialist Start Time (ACUTE ONLY) [id number]  Mobility Specialist Stop Time (ACUTE ONLY) 1013  Mobility Specialist Time Calculation (min) (ACUTE ONLY) 15 min   Pt semi-supine in bed on RA upon arrival. Pt completes bed mobility, STS, and ambulates in hallway indep. Pt returns to bed with needs in reach and husband present.   Wash Hack  Mobility Specialist  01/18/24 10:22 AM

## 2024-01-18 NOTE — Care Management Obs Status (Signed)
 MEDICARE OBSERVATION STATUS NOTIFICATION   Patient Details  Name: [name] MRN: [id number] Date of Birth: 09-04-1940   Medicare Observation Status Notification Given:  Yes    [place] L Craigory Toste, LCSW 01/18/2024, 11:00 AM

## 2024-01-18 NOTE — Discharge Summary (Signed)
 Physician Discharge Summary   Patient: [name] MRN: [id number] DOB: 1941/03/30  Admit date:     01/17/2024  Discharge date: 01/18/2024  Discharge Physician: Montey Apa   PCP: Sari Cunning, MD   Recommendations at discharge:    Follow up with Primary Care within 1 week  Repeat BMP within 1 week, follow up hyponatremia  Discharge Diagnoses: Principal Problem:   Hyponatremia due to SIADH Active Problems:   Dizzy spells  Resolved Problems:   * No resolved hospital problems. [facility] Course: HPI on admission: "[name] is a 83 y.o. female with medical history significant of chronic hyponatremia on sodium chloride  tablet, HTN, PAF on Eliquis , osteoporosis, presented with worsening of generalized weakness and lightheadedness.   Patient has a history of chronic hyponatremia for which she has been following with her PCP.  Several months ago, it was found she has been having worsening of hyponatremia and patient was started on sodium tablet initially was 2 g daily and several weeks ago increased to 3 g daily.  Patient also has been feeling fluid restriction, she drinks about 3 glasses of water a day +1 to 2 cups of tea on coffee estimated less than half a gallon a day.  This week, patient was started on injections daily Tymlos, and so far she has received 2 injections.  Soon after her injection she started to have frequent episodes of lightheadedness usually when she stands up or rise up but denied any fall.  She reported somewhat increased of nocturnal urination, denied any dysuria back pain, no fever or chills.   ED Course: Afebrile, no tachycardia blood pressure 150/78.  Blood work showed sodium 125 compared to last 136 2 weeks ago, WBC 5.9 hemoglobin 11.3 BUN 17 creatinine 0.5."   Patient was admitted for observation and further  management of acute on chronic hyponatremia which improved on fluid restriction.  Pt reported recently drinking significant more plain  water .  5/18 -- Pt doing well today, feels back to normal. Ambulating well, denies dizzy/lightheadedness, weakness have improved.  Assessment and Plan:  Acute on chronic hyponatremia - Worsening of sodium level despite on sodium supplement.  Etiology appears to be a SIADH patient is required sodium tablet for at least 2+ months already. - Review of her records showed there is no previous hyponatremia study.  Will send TSH, osmolality, urine osmolality and urine sodium level. - Continue sodium tablet 2 g daily - Fluid restriction - Recheck sodium level this afternoon and evening.   Near syncope - Euvolemic, etiology is likely related to side effect of recently started Tymlos injection for severe osteoporosis. - Check orthostatic signs - Defer discussion of whether to continue Tymlos or not to PCP -PT evaluation   Orthostatic hypotension - Continue midodrine    PAF - Sinus rhythm - Continue metoprolol  and Eliquis    Moderate to severe protein calorie malnutrition - BMI= 16, and patient already on protein shakes.       Consultants: None Procedures performed: None Disposition: Home Diet recommendation:  Heart Healthy with 1800 mL/day fluid restriction   DISCHARGE MEDICATION: Allergies as of 01/18/2024       Reactions   Bisphosphonates Other (See Comments)   Swallowing problems    Boniva [ibandronate] Other (See Comments)   Swallowing problems    Fosamax [alendronate Sodium] Other (See Comments)   Swallowing problems    Lipitor [atorvastatin] Other (See Comments)   Knee pain    Etodolac Rash  Medication List     TAKE these medications    apixaban  2.5 MG Tabs tablet Commonly known as: ELIQUIS  Take 1 tablet (2.5 mg total) by mouth 2 (two) times daily.   cholecalciferol  25 MCG (1000 UNIT) tablet Commonly known as: VITAMIN D3 Take 1,000 Units by mouth daily.   famotidine  40 MG tablet Commonly known as: PEPCID  Take 40 mg by mouth at bedtime.    lovastatin 40 MG tablet Commonly known as: MEVACOR Take 40 mg by mouth at bedtime.   methocarbamol  500 MG tablet Commonly known as: ROBAXIN  Take 1 tablet (500 mg total) by mouth every 6 (six) hours as needed for muscle spasms.   metoprolol  succinate 25 MG 24 hr tablet Commonly known as: TOPROL -XL Take 1 tablet by mouth 2 (two) times daily.   midodrine  5 MG tablet Commonly known as: PROAMATINE  Take 1 tablet by mouth 2 (two) times daily.   multivitamin tablet Take 1 tablet by mouth daily.   omeprazole 40 MG capsule Commonly known as: PRILOSEC Take 40 mg by mouth daily.   polyethylene glycol powder 17 GM/SCOOP powder Commonly known as: MiraLax  Take 17 g by mouth 2 (two) times daily as needed for moderate constipation.   PROLIA [place] Inject 1 Dose into the skin every 6 (six) months.   sodium chloride  1 g tablet Take 1 g by mouth 3 (three) times daily.   triamcinolone  0.025 % ointment Commonly known as: KENALOG  Apply 1 Application topically 2 (two) times daily. For 3-5 days.   TYMLOS [place] Inject 0.8 mLs into the skin daily.        Discharge Exam: There were no vitals filed for this visit. General exam: awake, alert, no acute distress HEENT: atraumatic, clear conjunctiva, anicteric sclera, moist mucus membranes, hearing grossly normal  Respiratory system: CTAB, no wheezes, rales or rhonchi, normal respiratory effort. Cardiovascular system: normal S1/S2, RRR, no JVD, murmurs, rubs, gallops, no pedal edema.   Gastrointestinal system: soft, NT, ND, no HSM felt, +bowel sounds. Central nervous system: A&O x4. no gross focal neurologic deficits, normal speech Extremities: moves all, no edema, normal tone Skin: dry, intact, normal temperature, normal color, No rashes, lesions or ulcers Psychiatry: normal mood, congruent affect, judgement and insight appear normal   Condition at discharge: stable  The results of significant diagnostics from this hospitalization (including  imaging, microbiology, ancillary and laboratory) are listed below for reference.   Imaging Studies: No results found.  Microbiology: Results for orders placed or performed during the hospital encounter of 03/24/23  Surgical pcr screen     Status: None   Collection Time: 03/24/23  3:02 PM   Specimen: Nasal Mucosa; Nasal Swab  Result Value Ref Range Status   MRSA, PCR NEGATIVE NEGATIVE Final   Staphylococcus aureus NEGATIVE NEGATIVE Final    Comment: (NOTE) The Xpert SA Assay (FDA approved for NASAL specimens in patients 84 years of age and older), is one component of a comprehensive surveillance program. It is not intended to diagnose infection nor to guide or monitor treatment. Performed at [facility], [address] [place], [place] [address]     Labs: CBC: Recent Labs  Lab 01/17/24 0823  WBC 5.9  NEUTROABS 4.2  HGB 11.3*  HCT 33.5*  MCV 93.8  PLT 176   Basic Metabolic Panel: Recent Labs  Lab 01/17/24 0823 01/17/24 1333 01/17/24 1959 01/18/24 0428  NA 125* 125* 127* 130*  K 3.9  --   --  3.9  CL 91*  --   --  93*  CO2 27  --   --  29  GLUCOSE 112*  --   --  91  BUN 17  --   --  13  CREATININE 0.55  --   --  0.52  CALCIUM  9.5  --   --  9.4   Liver Function Tests: No results for input(s): "AST", "ALT", "ALKPHOS", "BILITOT", "PROT", "ALBUMIN " in the last 168 hours. CBG: No results for input(s): "GLUCAP" in the last 168 hours.  Discharge time spent: less than 30 minutes.  Signed: Montey Apa, DO [facility] 01/23/2024

## 2024-01-26 ENCOUNTER — Other Ambulatory Visit: Payer: Self-pay | Admitting: Cardiovascular Disease

## 2024-01-27 NOTE — Telephone Encounter (Signed)
 Prescription refill request for Eliquis  received. Indication:afib Last office visit:5/25 Scr:0.52  5/25 Age: 83 Weight:44.2  kg  Prescription refilled

## 2024-01-29 DIAGNOSIS — E871 Hypo-osmolality and hyponatremia: Secondary | ICD-10-CM | POA: Diagnosis not present

## 2024-01-29 DIAGNOSIS — R0602 Shortness of breath: Secondary | ICD-10-CM | POA: Diagnosis not present

## 2024-01-29 DIAGNOSIS — R5383 Other fatigue: Secondary | ICD-10-CM | POA: Diagnosis not present

## 2024-02-02 DIAGNOSIS — R531 Weakness: Secondary | ICD-10-CM | POA: Diagnosis not present

## 2024-02-02 DIAGNOSIS — I2089 Other forms of angina pectoris: Secondary | ICD-10-CM | POA: Diagnosis not present

## 2024-02-02 DIAGNOSIS — R0602 Shortness of breath: Secondary | ICD-10-CM | POA: Diagnosis not present

## 2024-02-02 DIAGNOSIS — R000 Tachycardia, unspecified: Secondary | ICD-10-CM | POA: Diagnosis not present

## 2024-02-02 DIAGNOSIS — C3491 Malignant neoplasm of unspecified part of right bronchus or lung: Secondary | ICD-10-CM | POA: Diagnosis not present

## 2024-02-02 DIAGNOSIS — Q394 Esophageal web: Secondary | ICD-10-CM | POA: Diagnosis not present

## 2024-02-02 DIAGNOSIS — E782 Mixed hyperlipidemia: Secondary | ICD-10-CM | POA: Diagnosis not present

## 2024-02-02 DIAGNOSIS — I051 Rheumatic mitral insufficiency: Secondary | ICD-10-CM | POA: Diagnosis not present

## 2024-02-02 DIAGNOSIS — I4719 Other supraventricular tachycardia: Secondary | ICD-10-CM | POA: Diagnosis not present

## 2024-02-03 ENCOUNTER — Other Ambulatory Visit: Payer: Self-pay | Admitting: Internal Medicine

## 2024-02-03 DIAGNOSIS — R0602 Shortness of breath: Secondary | ICD-10-CM

## 2024-02-03 DIAGNOSIS — I2089 Other forms of angina pectoris: Secondary | ICD-10-CM

## 2024-02-04 ENCOUNTER — Telehealth (HOSPITAL_COMMUNITY): Payer: Self-pay | Admitting: *Deleted

## 2024-02-04 NOTE — Telephone Encounter (Signed)
Reaching out to patient to offer assistance regarding upcoming cardiac imaging study; pt verbalizes understanding of appt date/time, parking situation and where to check in, pre-test NPO status  and verified current allergies; name and call back number provided for further questions should they arise ? ?Adalaya Irion RN Navigator Cardiac Imaging ?[facility] ?[phone] office ?[phone] cell ? ?

## 2024-02-05 ENCOUNTER — Ambulatory Visit
Admission: RE | Admit: 2024-02-05 | Discharge: 2024-02-05 | Disposition: A | Discharge status: Home / Self Care | Source: Ambulatory Visit | Attending: Internal Medicine | Admitting: Internal Medicine

## 2024-02-05 ENCOUNTER — Telehealth: Payer: Self-pay | Admitting: Cardiovascular Disease

## 2024-02-05 DIAGNOSIS — I2089 Other forms of angina pectoris: Secondary | ICD-10-CM | POA: Diagnosis not present

## 2024-02-05 DIAGNOSIS — R0602 Shortness of breath: Secondary | ICD-10-CM | POA: Insufficient documentation

## 2024-02-05 MED ORDER — IOHEXOL 350 MG/ML SOLN
80.0000 mL | Freq: Once | INTRAVENOUS | Status: AC | PRN
  Administered 2024-02-05: 80 mL via INTRAVENOUS

## 2024-02-05 MED ORDER — NITROGLYCERIN 0.4 MG SL SUBL
0.8000 mg | SUBLINGUAL_TABLET | Freq: Once | SUBLINGUAL | Status: AC
  Administered 2024-02-05: 0.8 mg via SUBLINGUAL
  Filled 2024-02-05: qty 25

## 2024-02-05 NOTE — Progress Notes (Signed)
 Patient tolerated CT well. Vital signs stable encourage to drink water throughout day.Reasons explained and verbalized understanding. Ambulated steady gait.

## 2024-02-05 NOTE — Telephone Encounter (Signed)
 Pt calling to advise that she is switching to the cardiologist that her husband see's therefore leaving the practice

## 2024-02-10 ENCOUNTER — Ambulatory Visit: Payer: Self-pay

## 2024-02-10 NOTE — Telephone Encounter (Signed)
 Noted. Nothing further needed.

## 2024-02-10 NOTE — Telephone Encounter (Signed)
 FYI Only or Action Required?: FYI only for provider  Patient is followed in Pulmonology for [facility] lung, last seen on 10/30/2023 by Marc Senior, MD. Called Nurse Triage reporting No chief complaint on file.. Symptoms began several days ago. Interventions attempted: Nothing. Symptoms are: stable.  Triage Disposition: No disposition on file.  Patient/caregiver understands and will follow disposition?:     Copied from CRM [id number]. Topic: Clinical - Red Word Triage >> Feb 10, 2024 10:12 AM Juliana Ocean wrote: Red Word that prompted transfer to Nurse Triage: had some fast heartbeats lately w/ exertion /sob/cough/  Said her cardiologist wanted her to see Dr Kyle Pho, she is not sure why. Pt then states she would have to call me back, she hd a service person at the door she had been expecting.

## 2024-02-10 NOTE — Telephone Encounter (Signed)
 Reason for Disposition . [1] Follow-up call to recent contact AND [2] information only call, no triage required  Answer Assessment - Initial Assessment Questions 1. REASON FOR CALL or QUESTION: "What is your reason for calling today?" or "How can I best help you?" or "What question do you have that I can help answer?"     Calling in regarding an appointment inquiry, regarding Cardiology and Pulmonology.   The patient states she is going to speak with her Cardiologist first to see exactly why she needs to see her Pulmonologist. Denied an appointment at this time.  Reviewed red flag symptoms including: - Difficulty breathing -Severe chest pain - High or persistent fever - Mental status changes - Inability to hydrate - Worsening or unrelieved pain Patient/caregiver instructed to seek immediate care if any of the above develop.  Provided return precautions and escalation instructions appropriate to the concern.  Protocols used: Information Only Call - No Triage-A-AH

## 2024-02-11 ENCOUNTER — Other Ambulatory Visit (HOSPITAL_COMMUNITY)

## 2024-02-12 DIAGNOSIS — E871 Hypo-osmolality and hyponatremia: Secondary | ICD-10-CM | POA: Diagnosis not present

## 2024-02-13 ENCOUNTER — Encounter: Payer: Self-pay | Admitting: Pulmonary Disease

## 2024-02-13 ENCOUNTER — Other Ambulatory Visit
Admission: RE | Admit: 2024-02-13 | Discharge: 2024-02-13 | Disposition: A | Discharge status: Home / Self Care | Source: Ambulatory Visit | Attending: Pulmonary Disease | Admitting: Pulmonary Disease

## 2024-02-13 ENCOUNTER — Ambulatory Visit: Admitting: Pulmonary Disease

## 2024-02-13 VITALS — BP 112/60 | HR 70 | Temp 98.4°F | Ht 64.0 in | Wt 96.6 lb

## 2024-02-13 DIAGNOSIS — E871 Hypo-osmolality and hyponatremia: Secondary | ICD-10-CM | POA: Diagnosis not present

## 2024-02-13 DIAGNOSIS — C3491 Malignant neoplasm of unspecified part of right bronchus or lung: Secondary | ICD-10-CM

## 2024-02-13 DIAGNOSIS — Z7722 Contact with and (suspected) exposure to environmental tobacco smoke (acute) (chronic): Secondary | ICD-10-CM

## 2024-02-13 LAB — VITAMIN D 25 HYDROXY (VIT D DEFICIENCY, FRACTURES): Vit D, 25-Hydroxy: 57.2 ng/mL (ref 30–100)

## 2024-02-13 NOTE — Patient Instructions (Signed)
 VISIT SUMMARY:  Today, you were seen for fatigue and an increased heart rate. We discussed your history of lung cancer and recent treatments, as well as your current symptoms and overall health. Your recent cardiac scan showed no new lung abnormalities, and we reviewed your blood work results.  YOUR PLAN:  -LUNG CANCER: Your lung cancer has been treated with radiation, and your recent cardiac scan showed no new lung abnormalities. Your previous lung scan in February was clear, and you are scheduled for another scan in August. However, we will order a follow-up lung scan earlier than scheduled to ensure everything is still clear.  -ELEVATED CALCIUM  LEVEL: Your recent blood work shows an elevated calcium  level, which can be due to various reasons such as hyperparathyroidism or excessive vitamin D  intake. Elevated calcium  can lead to muscle weakness. We will order blood work to check your parathyroid hormone and vitamin D  levels to determine the cause.  -GASTROESOPHAGEAL REFLUX DISEASE: You have gastroesophageal reflux disease (GERD) with a hiatal hernia, which can cause symptoms like heartburn and coughing. Currently, you do not have symptoms of wheezing, but you do have an occasional cough.  INSTRUCTIONS:  Please follow up with the blood work to check your parathyroid hormone and vitamin D  levels. We will also schedule an earlier follow-up lung scan. Continue to monitor your symptoms and let us  know if there are any changes or if you experience any new symptoms.

## 2024-02-13 NOTE — Progress Notes (Addendum)
 Subjective:    Patient ID: [name], female    DOB: Mar 05, 1941, 83 y.o.   MRN: [id number]  Patient Care Team: Cleotilde Oneil FALCON, MD as PCP - General (Internal Medicine)  Chief Complaint  Patient presents with   Follow-up    Shortness of breath on exertion. Cough. No wheezing.     BACKGROUND/INTERVAL:Patient is an 83 year old lifelong never smoker with secondhand smoke exposure in the past who presents for follow-up of a right lower lobe lung nodule.  On initial evaluation, she was having significant difficulties with a hip fracture and poor response to repair.  She had lost significant amount of mobility and was very debilitated.  The patient was hesitant to undergo further procedures and opted for empiric SBRT as the nodule was hypermetabolic on PET/CT.  She completed SBRT therapy.  She was then seen December of 2024 and at the time it was noted that she had improved significantly and was ambulating independently again.  She is on Eliquis  given a prior history of PAT. she was last evaluated here on 30 October 2023.  She was doing well at that time.  She was regaining her strength and was fully ambulatory then.  HPI Discussed the use of AI scribe software for clinical note transcription with the patient, who gave verbal consent to proceed.  History of Present Illness   [name] is an 83 year old female with a history of lung cancer who presents with fatigue and increased heart rate. She is accompanied by her sister. She was referred by her cardiologist for consideration reassessment given her lung cancer history which was basically stage I status post SBRT.  She experiences significant fatigue and increased heart rate, which has gotten worse over the last several weeks. The fatigue is so severe that she needs to sit down after walking short distances, and her heart rate increases during these episodes.  She has a history of lung cancer and has undergone radiation treatment.  She had a scan in February with no evidence of metastatic disease and is due for another scan in August.  She experiences some coughing but no wheezing. She has a history of reflux and a hiatal hernia.  She is currently not engaging in physical therapy due to lack of energy, although she was previously attending after her hip surgery recovery.  She takes levothyroxine for her thyroid  condition and calcium  supplements. She does not generally have problems sleeping.     Review of Systems A 10 point review of systems was performed and it is as noted above otherwise negative.   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   Essential hypertension 03/24/2023   Chronic mitral valve regurgitation 03/24/2023   Bilateral lower extremity edema 03/24/2023   GERD (gastroesophageal reflux disease) 02/11/2014   Dyslipidemia 03/06/2010   CAD, NATIVE VESSEL 03/06/2010    Social History   Tobacco Use   Smoking status: Never    Passive exposure: Past   Smokeless tobacco: Never  Substance Use Topics   Alcohol use: No    Allergies  Allergen Reactions   Bisphosphonates Other (See Comments)    Swallowing problems    Boniva [Ibandronate] Other (See Comments)    Swallowing problems    Fosamax [Alendronate Sodium]  Other (See Comments)    Swallowing problems    Lipitor [Atorvastatin] Other (See Comments)    Knee pain    Etodolac Rash    Current Meds  Medication Sig   cholecalciferol  (VITAMIN D3) 25 MCG (1000 UNIT) tablet Take 1,000 Units by mouth daily.   ELIQUIS  2.5 MG TABS tablet TAKE 1 TABLET BY MOUTH 2 TIMES A DAY   famotidine  (PEPCID ) 40 MG tablet Take 40 mg by mouth at bedtime.   lovastatin (MEVACOR) 40 MG tablet Take 40 mg by mouth at bedtime.    methocarbamol  (ROBAXIN ) 500 MG tablet Take 1 tablet (500 mg total) by mouth every 6 (six) hours as needed for muscle spasms.   metoprolol  succinate (TOPROL -XL) 25 MG 24 hr tablet Take 1 tablet by mouth 2 (two) times daily.   midodrine  (PROAMATINE ) 5 MG tablet Take 1 tablet by mouth 2 (two) times daily.   Multiple Vitamin (MULTIVITAMIN) tablet Take 1 tablet by mouth daily.   omeprazole (PRILOSEC) 40 MG capsule Take 40 mg by mouth daily.   polyethylene glycol powder (MIRALAX ) 17 GM/SCOOP powder Take 17 g by mouth 2 (two) times daily as needed for moderate constipation.   sodium chloride  1 g tablet Take 1 g by mouth 3 (three) times daily.   triamcinolone  (KENALOG ) 0.025 % ointment Apply 1 Application topically 2 (two) times daily. For 3-5 days.    Immunization History  Administered Date(s) Administered   Fluad Trivalent(High Dose 65+) 05/04/2018   Fluzone Influenza virus vaccine,trivalent (IIV3), split virus 07/11/2015   Influenza, High Dose Seasonal PF 08/05/2016, 05/12/2017, 06/24/2019, 06/20/2021   Influenza, Mdck, Trivalent,PF 6+ MOS(egg free) 06/20/2023   Influenza,inj,Quad PF,6+ Mos 05/11/2020   Influenza-Unspecified 06/09/2012, 06/09/2013   Moderna Sars-Covid-2 Vaccination 09/14/2019, 10/12/2019, 06/19/2020, 07/16/2020   Pneumococcal Conjugate-13 06/04/2017   Pneumococcal Polysaccharide-23 01/02/2015   Zoster Recombinant(Shingrix) 02/19/2018, 02/20/2018, 05/04/2018   Zoster, Live 10/04/2012        Objective:     BP 112/60 (BP Location: Left Arm, Patient Position: Sitting, Cuff Size: Normal)   Pulse 70   Temp 98.4 F (36.9 C) (Oral)   Ht 5' 4 (1.626 m)   Wt 96 lb 9.6 oz (43.8 kg)   SpO2 97%   BMI 16.58 kg/m   SpO2: 97 %  GENERAL: Thin, frail-appearing woman, no acute distress.  She presents in transport chair today due to fatigue.  No conversational dyspnea. HEAD: Normocephalic, atraumatic.  EYES: Pupils equal, round, reactive to light.  No scleral icterus.  MOUTH:  Dentition intact.  Mucosa moist. NECK: Supple. No thyromegaly. Trachea midline. No JVD.  No adenopathy. PULMONARY: Good air entry bilaterally.  No adventitious sounds. CARDIOVASCULAR: S1 and S2. Regular rate and rhythm.  ABDOMEN: Benign. MUSCULOSKELETAL: No joint deformity, no clubbing, no edema.  NEUROLOGIC: Grossly intact.  No overt gait disturbance noted. SKIN: Intact,warm,dry. PSYCH: Mood and behavior normal.  Ambulatory oxymetry was performed today:  At rest on room air oxygen saturation was 100%, the patient ambulated at a slow pace, completed 2 laps, O2 nadir 98%, no shortness of breath.  Resting heart rate was 72 bpm at maximum for this exercise 81 bpm.  Patient could not complete testing due to weakness.  No evidence of significant O2 desaturations noted.       Assessment & Plan:     ICD-10-CM   1. Non-small cell cancer of right lung (HCC)  C34.91 CT CHEST WO CONTRAST    2. Hyponatremia due to SIADH  E87.1 Renal Function Panel  3. Hypercalcemia  E83.52 Renal Function Panel    VITAMIN D  25 Hydroxy (Vit-D Deficiency, Fractures)    PTH, Intact and Calcium      Orders Placed This Encounter  Procedures   CT CHEST WO CONTRAST    Standing Status:   Future    Expected Date:   02/20/2024    Expiration Date:   02/12/2025    Preferred imaging location?:   [facility]   Renal Function Panel   VITAMIN D  25 Hydroxy (Vit-D Deficiency, Fractures)    Standing Status:   Future    Number of Occurrences:   1    Expiration Date:   02/12/2025   PTH, Intact and Calcium     Standing Status:   Future    Number of Occurrences:   1    Expiration Date:   02/12/2025   Discussion:    Lung cancer Lung cancer with prior radiation treatment. Recent cardiac scan shows no new lung abnormalities, only effects of prior radiation. Previous lung scan in February was clear, and a follow-up scan is scheduled for August. Current symptoms of fatigue and tachycardia are more likely related to cardiac  or metabolic issues rather than lung cancer recurrence. - Order follow-up lung scan earlier than scheduled.  Elevated calcium  level Recent blood work shows elevated calcium  level. Potential causes include hyperparathyroidism or excessive vitamin D  intake. Discussed the possibility of calcium  supplement contributing to elevated levels and the controversy surrounding calcium  supplementation. Differential diagnosis includes metabolic causes or malignancy, though malignancy is less likely based on cardiac scan findings. Explained that elevated calcium  can lead to muscle weakness despite its role in muscle strength. - Order blood work to check parathyroid hormone and vitamin D  levels.  Gastroesophageal reflux disease Gastroesophageal reflux disease with a hiatal hernia. No current symptoms of wheezing, but occasional cough is present.     Advised if symptoms do not improve or worsen, to please contact office for sooner follow up or seek emergency care.    I spent 42 minutes of dedicated to the care of this patient on the date of this encounter to include pre-visit review of records, face-to-face time with the patient discussing conditions above, post visit ordering of testing, clinical documentation with the electronic health record, making appropriate referrals as documented, and communicating necessary findings to members of the patients care team.     C. Leita Sanders, MD Advanced Bronchoscopy PCCM [facility]    *This note was generated using voice recognition software/Dragon and/or AI transcription program.  Despite best efforts to proofread, errors can occur which can change the meaning. Any transcriptional errors that result from this process are unintentional and may not be fully corrected at the time of dictation.

## 2024-02-16 LAB — PTH, INTACT AND CALCIUM
Calcium, Total (PTH): 10.6 mg/dL — ABNORMAL HIGH (ref 8.7–10.3)
PTH: 26 pg/mL (ref 15–65)

## 2024-02-23 ENCOUNTER — Encounter: Payer: Self-pay | Admitting: Pulmonary Disease

## 2024-02-25 DIAGNOSIS — I051 Rheumatic mitral insufficiency: Secondary | ICD-10-CM | POA: Diagnosis not present

## 2024-02-25 DIAGNOSIS — E782 Mixed hyperlipidemia: Secondary | ICD-10-CM | POA: Diagnosis not present

## 2024-02-25 DIAGNOSIS — I4719 Other supraventricular tachycardia: Secondary | ICD-10-CM | POA: Diagnosis not present

## 2024-02-25 DIAGNOSIS — R000 Tachycardia, unspecified: Secondary | ICD-10-CM | POA: Diagnosis not present

## 2024-02-25 DIAGNOSIS — Q394 Esophageal web: Secondary | ICD-10-CM | POA: Diagnosis not present

## 2024-02-25 DIAGNOSIS — C3491 Malignant neoplasm of unspecified part of right bronchus or lung: Secondary | ICD-10-CM | POA: Diagnosis not present

## 2024-02-25 DIAGNOSIS — I2089 Other forms of angina pectoris: Secondary | ICD-10-CM | POA: Diagnosis not present

## 2024-02-25 DIAGNOSIS — R531 Weakness: Secondary | ICD-10-CM | POA: Diagnosis not present

## 2024-02-25 DIAGNOSIS — R0602 Shortness of breath: Secondary | ICD-10-CM | POA: Diagnosis not present

## 2024-02-26 ENCOUNTER — Other Ambulatory Visit

## 2024-03-01 DIAGNOSIS — H906 Mixed conductive and sensorineural hearing loss, bilateral: Secondary | ICD-10-CM | POA: Diagnosis not present

## 2024-03-01 DIAGNOSIS — H6123 Impacted cerumen, bilateral: Secondary | ICD-10-CM | POA: Diagnosis not present

## 2024-03-01 DIAGNOSIS — M272 Inflammatory conditions of jaws: Secondary | ICD-10-CM | POA: Diagnosis not present

## 2024-03-04 DIAGNOSIS — M810 Age-related osteoporosis without current pathological fracture: Secondary | ICD-10-CM | POA: Diagnosis not present

## 2024-03-07 ENCOUNTER — Encounter: Payer: Self-pay | Admitting: Pulmonary Disease

## 2024-03-09 NOTE — Telephone Encounter (Signed)
 Noted. Nothing further needed.

## 2024-03-09 NOTE — Telephone Encounter (Signed)
 I have spoke with [name] and we now have her CT scheduled on 03/11/2024 @ 1:30pm at [facility] and she is aware

## 2024-03-09 NOTE — Telephone Encounter (Signed)
 The problem is with 2 orders there are on the chart.  We can go ahead with the CT I ordered for earlier.  Pending that I can determine what to do with the 1 for 26 August.

## 2024-03-11 ENCOUNTER — Ambulatory Visit
Admission: RE | Admit: 2024-03-11 | Discharge: 2024-03-11 | Disposition: A | Discharge status: Home / Self Care | Source: Ambulatory Visit | Attending: Internal Medicine | Admitting: Internal Medicine

## 2024-03-11 DIAGNOSIS — C3491 Malignant neoplasm of unspecified part of right bronchus or lung: Secondary | ICD-10-CM | POA: Insufficient documentation

## 2024-03-11 DIAGNOSIS — I700 Atherosclerosis of aorta: Secondary | ICD-10-CM | POA: Diagnosis not present

## 2024-03-11 DIAGNOSIS — C3490 Malignant neoplasm of unspecified part of unspecified bronchus or lung: Secondary | ICD-10-CM | POA: Diagnosis not present

## 2024-03-15 ENCOUNTER — Ambulatory Visit: Payer: Self-pay | Admitting: Pulmonary Disease

## 2024-03-16 ENCOUNTER — Ambulatory Visit: Admitting: Pulmonary Disease

## 2024-03-16 ENCOUNTER — Encounter: Payer: Self-pay | Admitting: Pulmonary Disease

## 2024-03-16 VITALS — BP 112/66 | HR 64 | Temp 97.9°F | Ht 64.0 in | Wt 93.8 lb

## 2024-03-16 DIAGNOSIS — R5383 Other fatigue: Secondary | ICD-10-CM | POA: Diagnosis not present

## 2024-03-16 DIAGNOSIS — R627 Adult failure to thrive: Secondary | ICD-10-CM | POA: Diagnosis not present

## 2024-03-16 DIAGNOSIS — C3491 Malignant neoplasm of unspecified part of right bronchus or lung: Secondary | ICD-10-CM

## 2024-03-16 DIAGNOSIS — G478 Other sleep disorders: Secondary | ICD-10-CM

## 2024-03-16 DIAGNOSIS — M6281 Muscle weakness (generalized): Secondary | ICD-10-CM

## 2024-03-16 DIAGNOSIS — I480 Paroxysmal atrial fibrillation: Secondary | ICD-10-CM

## 2024-03-16 NOTE — Patient Instructions (Signed)
 VISIT SUMMARY:  During your visit, we discussed your ongoing fatigue and muscle weakness, which you have been experiencing since February. We also reviewed your history of cancer and recent CT scan results.  YOUR PLAN:  -MUSCLE WEAKNESS AND FATIGUE: You have been experiencing chronic muscle weakness and fatigue since February, with occasional hand tremors. This could be due to a neuromuscular disorder. We will refer you to Dr. Loreli, a neurologist, for further evaluation. Additionally, we will order pulmonary function tests to check the strength of your chest wall muscles. Please contact Dr. Florencio to address any discrepancies in your cardiac medication dosage.  -CANCER: Your recent CT scan shows no evidence of cancer recurrence, which is good news. There is some scarring in the previously treated area, but no active disease was found.  INSTRUCTIONS:  Please follow up with Dr. Loreli, the neurologist, for an evaluation of your muscle weakness and fatigue. Also, schedule pulmonary function tests as soon as possible. Contact Dr. Florencio to discuss your cardiac medication dosage. Continue with your current diet and supplements, and consider returning to therapy for your right leg and hip if your strength improves.

## 2024-03-16 NOTE — Progress Notes (Signed)
 Spoke with pt and relayed result note, she expressed understanding.

## 2024-03-16 NOTE — Progress Notes (Unsigned)
 Subjective:    Patient ID: [name], female    DOB: 1940-11-08, 83 y.o.   MRN: [id number]  Patient Care Team: Cleotilde Oneil FALCON, MD as PCP - General (Internal Medicine)  No chief complaint on file.   BACKGROUND/INTERVAL:  HPI    Review of Systems A 10 point review of systems was performed and it is as noted above otherwise negative.   Patient Active Problem List   Diagnosis Date Noted  . Dizzy spells 01/17/2024  . Paroxysmal atrial fibrillation (HCC) 03/31/2023  . Vitamin D  deficiency 03/26/2023  . Normocytic anemia 03/25/2023  . Right femoral shaft fracture (HCC) 03/24/2023  . 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  . Essential hypertension 03/24/2023  . Chronic mitral valve regurgitation 03/24/2023  . Bilateral lower extremity edema 03/24/2023  . GERD (gastroesophageal reflux disease) 02/11/2014  . Dyslipidemia 03/06/2010  . CAD, NATIVE VESSEL 03/06/2010    Social History   Tobacco Use  . Smoking status: Never    Passive exposure: Past  . Smokeless tobacco: Never  Substance Use Topics  . Alcohol use: No    Allergies  Allergen Reactions  . Bisphosphonates Other (See Comments)    Swallowing problems   . Boniva [Ibandronate] Other (See Comments)    Swallowing problems   . Fosamax [Alendronate Sodium] Other (See Comments)    Swallowing problems   . Lipitor [Atorvastatin] Other (See Comments)    Knee pain   . Etodolac Rash    No outpatient medications have been marked as taking for the 03/16/24 encounter (Appointment) with Tamea Dedra CROME, MD.    Immunization History  Administered Date(s) Administered  . Fluad Trivalent(High Dose 65+) 05/04/2018  . Fluzone Influenza virus vaccine,trivalent (IIV3), split virus 07/11/2015  . Influenza, High Dose Seasonal PF 08/05/2016, 05/12/2017, 06/24/2019, 06/20/2021  . Influenza, Mdck, Trivalent,PF 6+ MOS(egg free) 06/20/2023  . Influenza,inj,Quad PF,6+ Mos 05/11/2020  . Influenza-Unspecified 06/09/2012,  06/09/2013  . Moderna Sars-Covid-2 Vaccination 09/14/2019, 10/12/2019, 06/19/2020, 07/16/2020  . Pneumococcal Conjugate-13 06/04/2017  . Pneumococcal Polysaccharide-23 01/02/2015  . Zoster Recombinant(Shingrix) 02/19/2018, 02/20/2018, 05/04/2018  . Zoster, Live 10/04/2012        Objective:     There were no vitals taken for this visit.     GENERAL: HEAD: Normocephalic, atraumatic.  EYES: Pupils equal, round, reactive to light.  No scleral icterus.  MOUTH:  NECK: Supple. No thyromegaly. Trachea midline. No JVD.  No adenopathy. PULMONARY: Good air entry bilaterally.  No adventitious sounds. CARDIOVASCULAR: S1 and S2. Regular rate and rhythm.  ABDOMEN: MUSCULOSKELETAL: No joint deformity, no clubbing, no edema.  NEUROLOGIC:  SKIN: Intact,warm,dry. PSYCH:        Assessment & Plan:   No diagnosis found.  No orders of the defined types were placed in this encounter.   No orders of the defined types were placed in this encounter.      Advised if symptoms do not improve or worsen, to please contact office for sooner follow up or seek emergency care.    I spent xxx minutes of dedicated to the care of this patient on the date of this encounter to include pre-visit review of records, face-to-face time with the patient discussing conditions above, post visit ordering of testing, clinical documentation with the electronic health record, making appropriate referrals as documented, and communicating necessary findings to members of the patients care team.     C. Leita Tamea, MD Advanced Bronchoscopy PCCM [facility]    *This note was generated using voice recognition software/Dragon  and/or AI transcription program.  Despite best efforts to proofread, errors can occur which can change the meaning. Any transcriptional errors that result from this process are unintentional and may not be fully corrected at the time of dictation.

## 2024-03-18 ENCOUNTER — Other Ambulatory Visit

## 2024-03-26 DIAGNOSIS — I4719 Other supraventricular tachycardia: Secondary | ICD-10-CM | POA: Diagnosis not present

## 2024-03-26 DIAGNOSIS — E871 Hypo-osmolality and hyponatremia: Secondary | ICD-10-CM | POA: Diagnosis not present

## 2024-03-26 DIAGNOSIS — R5382 Chronic fatigue, unspecified: Secondary | ICD-10-CM | POA: Diagnosis not present

## 2024-04-01 ENCOUNTER — Telehealth: Payer: Self-pay

## 2024-04-01 NOTE — Telephone Encounter (Signed)
 I spoke with the patient. She does not want to have the PFT or HST test done at this time. She feels like she has too many test to do right now. She will call back when she is ready to schedule them.  Dr. Tamea- just and FYI.  Nothing further needed.

## 2024-04-01 NOTE — Telephone Encounter (Signed)
 Copied from CRM [id number]. Topic: General - Other >> Apr 01, 2024  1:27 PM Whitney O wrote: Reason for CRM: patient is calling to get a message to dr tamea about appointment next week  Monday for a pft and I want to postponed that and she had ordered a sleep test and I have ask them not send the kit yet until I talk to Dr tamea Please reach out to patient concerning the pft and sleep test patient didn't want to speak about it until she spoke to doctor or nurse [phone]

## 2024-04-05 ENCOUNTER — Encounter

## 2024-04-06 DIAGNOSIS — M810 Age-related osteoporosis without current pathological fracture: Secondary | ICD-10-CM | POA: Diagnosis not present

## 2024-04-13 DIAGNOSIS — C3491 Malignant neoplasm of unspecified part of right bronchus or lung: Secondary | ICD-10-CM | POA: Diagnosis not present

## 2024-04-13 DIAGNOSIS — E782 Mixed hyperlipidemia: Secondary | ICD-10-CM | POA: Diagnosis not present

## 2024-04-16 DIAGNOSIS — I4719 Other supraventricular tachycardia: Secondary | ICD-10-CM | POA: Diagnosis not present

## 2024-04-16 DIAGNOSIS — R5382 Chronic fatigue, unspecified: Secondary | ICD-10-CM | POA: Diagnosis not present

## 2024-04-16 DIAGNOSIS — R002 Palpitations: Secondary | ICD-10-CM | POA: Diagnosis not present

## 2024-04-16 DIAGNOSIS — E871 Hypo-osmolality and hyponatremia: Secondary | ICD-10-CM | POA: Diagnosis not present

## 2024-04-20 DIAGNOSIS — E871 Hypo-osmolality and hyponatremia: Secondary | ICD-10-CM | POA: Diagnosis not present

## 2024-04-20 DIAGNOSIS — I4719 Other supraventricular tachycardia: Secondary | ICD-10-CM | POA: Diagnosis not present

## 2024-04-20 DIAGNOSIS — I051 Rheumatic mitral insufficiency: Secondary | ICD-10-CM | POA: Diagnosis not present

## 2024-04-23 ENCOUNTER — Ambulatory Visit

## 2024-04-23 DIAGNOSIS — R5383 Other fatigue: Secondary | ICD-10-CM | POA: Diagnosis not present

## 2024-04-23 LAB — PULMONARY FUNCTION TEST
DL/VA % pred: 65 %
DL/VA: 2.7 ml/min/mmHg/L
DLCO unc % pred: 47 %
DLCO unc: 8.68 ml/min/mmHg
FEF 25-75 Post: 0.85 L/s
FEF 25-75 Pre: 1.65 L/s
FEF2575-%Change-Post: -48 %
FEF2575-%Pred-Post: 67 %
FEF2575-%Pred-Pre: 131 %
FEV1-%Change-Post: 1 %
FEV1-%Pred-Post: 86 %
FEV1-%Pred-Pre: 85 %
FEV1-Post: 1.59 L
FEV1-Pre: 1.57 L
FEV1FVC-%Change-Post: -1 %
FEV1FVC-%Pred-Pre: 104 %
FEV6-%Change-Post: 2 %
FEV6-%Pred-Post: 90 %
FEV6-%Pred-Pre: 88 %
FEV6-Post: 2.11 L
FEV6-Pre: 2.06 L
FEV6FVC-%Pred-Post: 106 %
FEV6FVC-%Pred-Pre: 106 %
FVC-%Change-Post: 2 %
FVC-%Pred-Post: 85 %
FVC-%Pred-Pre: 82 %
FVC-Post: 2.11 L
FVC-Pre: 2.06 L
Post FEV1/FVC ratio: 75 %
Post FEV6/FVC ratio: 100 %
Pre FEV1/FVC ratio: 76 %
Pre FEV6/FVC Ratio: 100 %
RV % pred: 82 %
RV: 2.01 L
TLC % pred: 79 %
TLC: 4 L

## 2024-04-23 NOTE — Patient Instructions (Signed)
 Full PFT completed today with MIP/MEP

## 2024-04-23 NOTE — Progress Notes (Signed)
 Full PFT completed today with MIP/MEP

## 2024-04-26 DIAGNOSIS — R5383 Other fatigue: Secondary | ICD-10-CM | POA: Diagnosis not present

## 2024-04-26 DIAGNOSIS — G479 Sleep disorder, unspecified: Secondary | ICD-10-CM | POA: Diagnosis not present

## 2024-04-26 DIAGNOSIS — G250 Essential tremor: Secondary | ICD-10-CM | POA: Diagnosis not present

## 2024-04-26 DIAGNOSIS — Z1331 Encounter for screening for depression: Secondary | ICD-10-CM | POA: Diagnosis not present

## 2024-04-26 DIAGNOSIS — R531 Weakness: Secondary | ICD-10-CM | POA: Diagnosis not present

## 2024-04-27 ENCOUNTER — Ambulatory Visit

## 2024-04-27 ENCOUNTER — Other Ambulatory Visit: Payer: PPO

## 2024-04-27 ENCOUNTER — Ambulatory Visit: Payer: PPO

## 2024-04-27 DIAGNOSIS — L821 Other seborrheic keratosis: Secondary | ICD-10-CM

## 2024-04-27 DIAGNOSIS — C4490 Unspecified malignant neoplasm of skin, unspecified: Secondary | ICD-10-CM

## 2024-04-27 DIAGNOSIS — L578 Other skin changes due to chronic exposure to nonionizing radiation: Secondary | ICD-10-CM | POA: Diagnosis not present

## 2024-04-27 DIAGNOSIS — D1801 Hemangioma of skin and subcutaneous tissue: Secondary | ICD-10-CM

## 2024-04-27 DIAGNOSIS — W908XXA Exposure to other nonionizing radiation, initial encounter: Secondary | ICD-10-CM | POA: Diagnosis not present

## 2024-04-27 DIAGNOSIS — L820 Inflamed seborrheic keratosis: Secondary | ICD-10-CM | POA: Diagnosis not present

## 2024-04-27 DIAGNOSIS — Z85828 Personal history of other malignant neoplasm of skin: Secondary | ICD-10-CM | POA: Diagnosis not present

## 2024-04-27 DIAGNOSIS — L814 Other melanin hyperpigmentation: Secondary | ICD-10-CM

## 2024-04-27 DIAGNOSIS — Z1283 Encounter for screening for malignant neoplasm of skin: Secondary | ICD-10-CM | POA: Diagnosis not present

## 2024-04-27 NOTE — Patient Instructions (Addendum)

## 2024-04-27 NOTE — Progress Notes (Signed)
   Follow-Up Visit   Subjective  [name] is a 83 y.o. female who presents for the following: Patient has one area of concern on her back and one on her chest. Hx of SCC and AK's.   The following portions of the chart were reviewed this encounter and updated as appropriate: medications, allergies, medical history  Review of Systems:  No other skin or systemic complaints except as noted in HPI or Assessment and Plan.  Objective  Well appearing patient in no apparent distress; mood and affect are within normal limits.  A focused examination was performed of the following areas: Back, Chest, abdomen, upper extremities, face - Actinic Elastosis: chronic sun damage: dyspigmentation, telangiectasia, and wrinkling - 6-20 mm pigmented macules that are tan to brown in color and are somewhat non-uniform in shape and concentrated in the sun-exposed areas - Multiple stuck-on brown, tan and grey papillated papules and plaques on trunk - LOC 1 and 2  - - Cherry-red vascular papule(s) on trunk     Relevant exam findings are noted in the Assessment and Plan.  Left Upper Back x1, medial chest x1 (2) Stuck on waxy paps with erythema   Assessment & Plan   HISTORY OF SQUAMOUS CELL CARCINOMA OF THE SKIN - No evidence of recurrence today - No lymphadenopathy - Recommend regular full body skin exams - Recommend daily broad spectrum sunscreen SPF 30+ to sun-exposed areas, reapply every 2 hours as needed.  - Call if any new or changing lesions are noted between office visits  SEBORRHEIC KERATOSIS on chest and back - Stuck-on, waxy, tan-brown papules and/or plaques  - Benign-appearing - Discussed benign etiology and prognosis. - Observe - Call for any changes  Lentigines Angiomas  - Benign-appearing  Diffuse actinic skin damage  - Evidence of actinic skin damage on exam today. This is a chronic with expected duration over 1 year. A portion of actinic keratoses will progress to  squamous cell carcinoma of the skin. It is not possible to reliably predict which spots will progress to skin cancer and so treatment is recommended to prevent development of skin cancer. - Reviewed sun avoidance practices, including protective clothing and hat, midday sun avoidance, sunscreen SPF>30 applications Q2-3H when in sun especially after sweating - Reviewed signs/symptoms of skin cancer, patient asked come in sooner if these appear.   INFLAMED SEBORRHEIC KERATOSIS (2) Left Upper Back x1, medial chest x1 (2) Symptomatic, irritating, patient would like treated.  Seborrheic keratosis, inflamed - Discussed diagnosis, typical course, and treatment options for this condition - Reassurance, benign, monitor - ABCDE's discussed - Given irritation and symptoms, will proceed with cryotherapy as below  Cryotherapy Procedure Note:  After R/B/A discussed (including scarring, pigment alteration, recurrence, or persistence of the lesion) and verbal consent was obtained, identified lesions were treated with liquid nitrogen x 1-2 ten second freeze-thaw cycle. The patient tolerated the procedure well and was instructed on post-procedure care. Total ISK(s) frozen: 2 Location and number: back and chest   SKIN EXAM FOR MALIGNANT NEOPLASM   LENTIGO   SEBORRHEIC KERATOSIS   CHERRY ANGIOMA   NON-MELANOMA SKIN CANCER   ACTINIC SKIN DAMAGE    Return for Appointment as scheduled.  I, Emerick Ege, CMA am acting as scribe for Lauraine JAYSON Kanaris, MD.   Documentation: I have reviewed the above documentation for accuracy and completeness, and I agree with the above.  Lauraine JAYSON Kanaris, MD

## 2024-04-29 ENCOUNTER — Ambulatory Visit: Admitting: Pulmonary Disease

## 2024-04-29 ENCOUNTER — Encounter: Payer: Self-pay | Admitting: Pulmonary Disease

## 2024-04-29 VITALS — BP 140/80 | HR 72 | Temp 97.9°F | Ht 64.0 in | Wt 93.8 lb

## 2024-04-29 DIAGNOSIS — J683 Other acute and subacute respiratory conditions due to chemicals, gases, fumes and vapors: Secondary | ICD-10-CM | POA: Diagnosis not present

## 2024-04-29 DIAGNOSIS — R5383 Other fatigue: Secondary | ICD-10-CM

## 2024-04-29 DIAGNOSIS — C3491 Malignant neoplasm of unspecified part of right bronchus or lung: Secondary | ICD-10-CM | POA: Diagnosis not present

## 2024-04-29 DIAGNOSIS — R052 Subacute cough: Secondary | ICD-10-CM

## 2024-04-29 DIAGNOSIS — M6281 Muscle weakness (generalized): Secondary | ICD-10-CM | POA: Diagnosis not present

## 2024-04-29 LAB — NITRIC OXIDE: Nitric Oxide: 13

## 2024-04-29 MED ORDER — ARNUITY ELLIPTA 100 MCG/ACT IN AEPB: 1.0000 | INHALATION_SPRAY | Freq: Every day | RESPIRATORY_TRACT | 1 refills | Status: AC

## 2024-04-29 NOTE — Patient Instructions (Signed)
 VISIT SUMMARY:  During your visit, we discussed your ongoing issues with cough and fatigue. We noted that your fatigue has improved since switching your medication from metoprolol  and Motrin to propranolol, which has also helped control your tremors. We also addressed your chronic cough, which is more pronounced during the day and produces clear phlegm in the mornings. Additionally, we talked about your generalized muscle weakness and its potential contribution to your fatigue.  YOUR PLAN:  -CHRONIC COUGH AND FATIGUE: Your chronic cough produces clear phlegm and is more noticeable during the day. Although propranolol may contribute to the cough, this is not certain. We will administer a nitric oxide  test to check for airway inflammation and have prescribed an inhaler for daily use to help reduce inflammation. Please make sure to rinse your mouth after using the inhaler.  The inhaler is Arnuity Ellipta .  -TREMOR AND TACHYCARDIA: Your tremor and rapid heart rate have improved significantly since switching to propranolol, which helps control both issues more effectively than your previous medication.  -GENERALIZED MUSCLE WEAKNESS: Your muscle weakness is likely contributing to your fatigue. We recommend increasing your protein intake to help strengthen your muscles.  INSTRUCTIONS:  Please follow up for the nitric oxide  test to assess airway inflammation. Use the prescribed inhaler daily and rinse your mouth after each use. Increase your protein intake to help with muscle strength.

## 2024-04-29 NOTE — Progress Notes (Signed)
 Subjective:    Patient ID: [name], female    DOB: 1940-10-10, 83 y.o.   MRN: [id number]  Patient Care Team: Cleotilde Oneil FALCON, MD as PCP - General (Internal Medicine)  Chief Complaint  Patient presents with   Medical Management of Chronic Issues    Cough with occasional white phlegm. Patient was seen by neurology, Dr. Lane. Patient will try to see Dr. Maree. Patient refused home sleep study.     BACKGROUND/INTERVAL:Patient is an 83 year old lifelong never smoker with secondhand smoke exposure in the past who presents for follow-up of a right lower lobe lung nodule.  On initial evaluation, she was having significant difficulties with a hip fracture and poor response to repair.  She had lost significant amount of mobility and was very debilitated.  The patient was hesitant to undergo further procedures and opted for empiric SBRT as the nodule was hypermetabolic on PET/CT.  She completed SBRT therapy.  She was then seen December of 2024 and at the time it was noted that she had improved significantly and was ambulating independently again.  She is on Eliquis  given a prior history of PAT. she was last evaluated here on 13 February 2024.  At that time she had complaints of muscle weakness and generalized fatigue.  There were concerns that she may have had recurrence of non-small cell lung cancer.  Ergo, the patient was rereferred for that evaluation.  A CT scan of the chest was ordered.  CT scan performed 10 July showed no evidence of residual recurrent disease within the chest.  There were stable postradiation changes on the right lower lobe.   HPI Discussed the use of AI scribe software for clinical note transcription with the patient, who gave verbal consent to proceed.  History of Present Illness   [name] is an 83 year old female who presents with cough and fatigue.  She has experienced improvement in her fatigue after her medication was changed from metoprolol  and midodrine  to  propranolol. Previously, her heart rate was consistently over 100 beats per minute. The change in medication has also helped control her tremors.  Overall she feels better on this medication.  She has a cough that is more pronounced during the day. She uses a cough drop at night to manage it and notes that the phlegm in her mouth is clear but persistent in the mornings. She is unsure if the cough has worsened since starting propranolol.  This has been more pronounced over the last few months.  When she lies down and takes a deep breath, it feels 'tired or sore'. She also has some sinus issues at times.  Her current medications include propranolol, 20 mg twice a day.  She was evaluated by neurology however, she felt that the evaluation was unsatisfying  She had pulmonary function testing performed on 22 August her MIP and MEP's were consistent with muscular weakness.  She had minimal obstructive and restrictive defects and moderate diffusion defect.  The restriction and mild diffusion defect can be a reflection of her prior SBRT.      Review of Systems A 10 point review of systems was performed and it is as noted above otherwise negative.   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  Essential hypertension 03/24/2023   Chronic mitral valve regurgitation 03/24/2023   Bilateral lower extremity edema 03/24/2023   GERD (gastroesophageal reflux disease) 02/11/2014   Dyslipidemia 03/06/2010   CAD, NATIVE VESSEL 03/06/2010    Social History   Tobacco Use   Smoking status: Never    Passive exposure: Past   Smokeless tobacco: Never  Substance Use Topics   Alcohol use: No    Allergies  Allergen Reactions   Bisphosphonates Other (See Comments)     Swallowing problems    Boniva [Ibandronate] Other (See Comments)    Swallowing problems    Fosamax [Alendronate Sodium] Other (See Comments)    Swallowing problems    Lipitor [Atorvastatin] Other (See Comments)    Knee pain    Etodolac Rash    Current Meds  Medication Sig   Abaloparatide (TYMLOS [place]) Inject 0.8 mLs into the skin daily.   cholecalciferol  (VITAMIN D3) 25 MCG (1000 UNIT) tablet Take 1,000 Units by mouth daily.   ELIQUIS  2.5 MG TABS tablet TAKE 1 TABLET BY MOUTH 2 TIMES A DAY   famotidine  (PEPCID ) 40 MG tablet Take 40 mg by mouth at bedtime.   Fluticasone Furoate  (ARNUITY ELLIPTA ) 100 MCG/ACT AEPB Inhale 1 puff into the lungs daily.   lovastatin (MEVACOR) 40 MG tablet Take 40 mg by mouth at bedtime.   Multiple Vitamin (MULTIVITAMIN) tablet Take 1 tablet by mouth daily.   omeprazole (PRILOSEC) 40 MG capsule Take 40 mg by mouth daily.   polyethylene glycol powder (MIRALAX ) 17 GM/SCOOP powder Take 17 g by mouth 2 (two) times daily as needed for moderate constipation.   propranolol (INDERAL) 20 MG tablet Take 20 mg by mouth 2 (two) times daily.   sodium chloride  1 g tablet Take 1 g by mouth 3 (three) times daily.    Immunization History  Administered Date(s) Administered   Fluad Trivalent(High Dose 65+) 05/04/2018   Fluzone Influenza virus vaccine,trivalent (IIV3), split virus 07/11/2015   INFLUENZA, HIGH DOSE SEASONAL PF 08/05/2016, 05/12/2017, 06/24/2019, 06/20/2021   Influenza, Mdck, Trivalent,PF 6+ MOS(egg free) 06/20/2023   Influenza,inj,Quad PF,6+ Mos 05/11/2020   Influenza-Unspecified 06/09/2012, 06/09/2013   Moderna Sars-Covid-2 Vaccination 09/14/2019, 10/12/2019, 06/19/2020, 07/16/2020   Pneumococcal Conjugate-13 06/04/2017   Pneumococcal Polysaccharide-23 01/02/2015   Zoster Recombinant(Shingrix) 02/19/2018, 02/20/2018, 05/04/2018   Zoster, Live 10/04/2012        Objective:     BP (!) 140/80   Pulse 72   Temp 97.9 F (36.6 C) (Oral)   Ht 5' 4 (1.626  m)   Wt 93 lb 12.8 oz (42.5 kg)   SpO2 98%   BMI 16.10 kg/m   SpO2: 98 %  GENERAL: HEAD: Normocephalic, atraumatic.  EYES: Pupils equal, round, reactive to light.  No scleral icterus.  MOUTH:  NECK: Supple. No thyromegaly. Trachea midline. No JVD.  No adenopathy. PULMONARY: Good air entry bilaterally.  No adventitious sounds. CARDIOVASCULAR: S1 and S2. Regular rate and rhythm.  ABDOMEN: MUSCULOSKELETAL: No joint deformity, no clubbing, no edema.  NEUROLOGIC:  SKIN: Intact,warm,dry. PSYCH:  Recent Results (from the past 2160 hours)  PTH, Intact and Calcium      Status: Abnormal   Collection Time: 02/13/24 12:35 PM  Result Value Ref Range   PTH 26 15 - 65 pg/mL   Calcium , Total (PTH) 10.6 (H) 8.7 - 10.3 mg/dL   PTH Interp Comment     Comment: (NOTE) Interpretation                 Intact PTH  Calcium                                 (pg/mL)      (mg/dL) Normal                          15 - 65     8.6 - 10.2 Primary Hyperparathyroidism         >65          >10.2 Secondary Hyperparathyroidism       >65          <10.2 Non-Parathyroid Hypercalcemia       <65          >10.2 Hypoparathyroidism                  <15          < 8.6 Non-Parathyroid Hypocalcemia    15 - 65          < 8.6 Performed At: [facility] [address] [place], [place] [id number] Jennette Shorter MD Ey:[id number]   VITAMIN D  25 Hydroxy (Vit-D Deficiency, Fractures)     Status: None   Collection Time: 02/13/24 12:35 PM  Result Value Ref Range   Vit D, 25-Hydroxy 57.20 30 - 100 ng/mL    Comment: (NOTE) Vitamin D  deficiency has been defined by the Institute of Medicine  and an Endocrine Society practice guideline as a level of serum 25-OH  vitamin D  less than 20 ng/mL (1,2). The Endocrine Society went on to  further define vitamin D  insufficiency as a level between 21 and 29  ng/mL (2).  1. IOM (Institute of Medicine). 2010. Dietary reference intakes for  calcium  and D. [place]: [employer]. 2. Holick MF, Binkley Bethel, Bischoff-Ferrari HA, et al. Evaluation,  treatment, and prevention of vitamin D  deficiency: an Endocrine  Society clinical practice guideline, JCEM. 2011 Jul; 96(7): 1911-30.  Performed at [facility], [address], [place], [place] [address]   Pulmonary function test     Status: None   Collection Time: 04/23/24 11:04 AM  Result Value Ref Range   FVC-Pre 2.06 L   FVC-%Pred-Pre 82 %   FVC-Post 2.11 L   FVC-%Pred-Post 85 %   FVC-%Change-Post 2 %   FEV1-Pre 1.57 L   FEV1-%Pred-Pre 85 %   FEV1-Post 1.59 L   FEV1-%Pred-Post 86 %   FEV1-%Change-Post 1 %   FEV6-Pre 2.06 L   FEV6-%Pred-Pre 88 %   FEV6-Post 2.11 L   FEV6-%Pred-Post 90 %   FEV6-%Change-Post 2 %   Pre FEV1/FVC ratio 76 %   FEV1FVC-%Pred-Pre 104 %   Post FEV1/FVC ratio 75 %   FEV1FVC-%Change-Post -1 %   Pre FEV6/FVC Ratio 100 %   FEV6FVC-%Pred-Pre 106 %   Post FEV6/FVC ratio 100 %   FEV6FVC-%Pred-Post 106 %   FEF 25-75 Pre 1.65 L/sec   FEF2575-%Pred-Pre 131 %   FEF 25-75 Post 0.85 L/sec   FEF2575-%Pred-Post 67 %   FEF2575-%Change-Post -48 %   RV 2.01 L   RV % pred 82 %   TLC 4.00 L   TLC % pred 79 %   DLCO unc 8.68 ml/min/mmHg   DLCO unc % pred 47 %   DL/VA 7.29 ml/min/mmHg/L   DL/VA % pred 65 %  Nitric oxide      Status: None   Collection Time: 04/29/24  9:19 AM  Result Value Ref Range   Nitric Oxide  13   *Discussed PFTs with patient   Lab Results  Component Value Date   NITRICOXIDE 13 04/29/2024  *No evidence of type II inflammation present.   Assessment & Plan:     ICD-10-CM   1. 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 Subacute cough  R05.2     3. Fatigue, unspecified type  R53.83     4. Muscle weakness (generalized)  M62.81     5. Non-small cell cancer of right lung (HCC)  C34.91       Orders Placed This Encounter  Procedures   Nitric oxide     Meds ordered this encounter  Medications   Fluticasone  Furoate (ARNUITY ELLIPTA ) 100 MCG/ACT AEPB    Sig: Inhale 1 puff into the lungs daily.    Dispense:  30 each    Refill:  1   Discussion:    Chronic cough and fatigue Chronic cough with clear sputum, more pronounced during the day. Fatigue has improved after switching from metoprolol  to propranolol. Differential includes asthma, but nitric oxide  test for airway inflammation was not elevated. Propranolol may contribute to cough, but this is not definitive. - Prescribe inhaler for daily use to reduce airway inflammation (Arnuity Ellipta  100, 1 elation daily) - Instruct on proper inhaler use and emphasize rinsing mouth after use  Tremor and tachycardia, improved on propranolol Tremor and tachycardia have improved significantly after switching from metoprolol  to propranolol. Propranolol provides more comprehensive beta-blockade, addressing both heart rate and tremor.  Generalized muscle weakness Generalized muscle weakness likely contributing to fatigue. Breathing test indicates muscle weakness rather than asthma as a primary issue. - Encourage increased protein intake to strengthen muscles      Advised if symptoms do not improve or worsen, to please contact office for sooner follow up or seek emergency care.    I spent 32 minutes of dedicated to the care of this patient on the date of this encounter to include pre-visit review of records, face-to-face time with the patient discussing conditions above, post visit ordering of testing, clinical documentation with the electronic health record, making appropriate referrals as documented, and communicating necessary findings to members of the patients care team.     C. Leita Sanders, MD Advanced Bronchoscopy PCCM [facility]    *This note was generated using voice recognition software/Dragon and/or AI transcription program.  Despite best efforts to proofread, errors can occur which can change the meaning. Any transcriptional  errors that result from this process are unintentional and may not be fully corrected at the time of dictation.

## 2024-04-30 DIAGNOSIS — M272 Inflammatory conditions of jaws: Secondary | ICD-10-CM | POA: Diagnosis not present

## 2024-04-30 DIAGNOSIS — H6123 Impacted cerumen, bilateral: Secondary | ICD-10-CM | POA: Diagnosis not present

## 2024-04-30 DIAGNOSIS — H906 Mixed conductive and sensorineural hearing loss, bilateral: Secondary | ICD-10-CM | POA: Diagnosis not present

## 2024-05-04 ENCOUNTER — Encounter: Payer: Self-pay | Admitting: Pulmonary Disease

## 2024-05-05 NOTE — Telephone Encounter (Signed)
 Please see what a cheaper alternative to the Arnuity would be. Thank you!

## 2024-05-06 ENCOUNTER — Other Ambulatory Visit (HOSPITAL_COMMUNITY): Payer: Self-pay

## 2024-05-06 ENCOUNTER — Ambulatory Visit: Admitting: Pulmonary Disease

## 2024-05-06 DIAGNOSIS — I4719 Other supraventricular tachycardia: Secondary | ICD-10-CM | POA: Diagnosis not present

## 2024-05-06 DIAGNOSIS — E871 Hypo-osmolality and hyponatremia: Secondary | ICD-10-CM | POA: Diagnosis not present

## 2024-05-06 DIAGNOSIS — M810 Age-related osteoporosis without current pathological fracture: Secondary | ICD-10-CM | POA: Diagnosis not present

## 2024-05-10 ENCOUNTER — Encounter: Payer: Self-pay | Admitting: Radiation Oncology

## 2024-05-10 ENCOUNTER — Ambulatory Visit
Admission: RE | Admit: 2024-05-10 | Discharge: 2024-05-10 | Disposition: A | Discharge status: Home / Self Care | Payer: PPO | Source: Ambulatory Visit | Attending: Radiation Oncology | Admitting: Radiation Oncology

## 2024-05-10 VITALS — BP 142/69 | HR 61 | Temp 97.0°F | Resp 14 | Ht 64.0 in | Wt 94.7 lb

## 2024-05-10 DIAGNOSIS — Z923 Personal history of irradiation: Secondary | ICD-10-CM | POA: Insufficient documentation

## 2024-05-10 DIAGNOSIS — I2510 Atherosclerotic heart disease of native coronary artery without angina pectoris: Secondary | ICD-10-CM | POA: Diagnosis not present

## 2024-05-10 DIAGNOSIS — R911 Solitary pulmonary nodule: Secondary | ICD-10-CM | POA: Diagnosis not present

## 2024-05-10 DIAGNOSIS — I4719 Other supraventricular tachycardia: Secondary | ICD-10-CM | POA: Insufficient documentation

## 2024-05-10 DIAGNOSIS — C3431 Malignant neoplasm of lower lobe, right bronchus or lung: Secondary | ICD-10-CM | POA: Diagnosis not present

## 2024-05-10 NOTE — Progress Notes (Signed)
 Radiation Oncology Follow up Note  Name: [name]   Date:   05/10/2024 MRN:  [id number] DOB: 03-24-1941    This 83 y.o. female presents to the clinic today for 1 year follow-up status post SBRT to right lower lobe for stage I non-small cell lung cancer.  REFERRING PROVIDER: Cleotilde Oneil FALCON, MD  HPI: Patient is an 83 year old female now out 1 year having completed SBRT to her right lower lobe for stage I non-small cell lung cancer seen today in routine follow-up she is doing well.  She has a mild cyst cough sometimes slight clear production.  No hemoptysis chest tightness or dysphagia..  She had a recent CT scan of her chest showing stable postradiation changes within the right lower lobe no evidence of residual recurrent disease or progression in her chest.  She has moderate coronary artery calcification.  She is currently seeing Dr. Tamea with tune up of her pulmonary functions.  Patient is also been recently worked up for tachycardia with fatigue.  She has been diagnosed with AV nodal reentry tachycardia.  COMPLICATIONS OF TREATMENT: none  FOLLOW UP COMPLIANCE: keeps appointments   PHYSICAL EXAM:  BP (!) 142/69   Pulse 61   Temp (!) 97 F (36.1 C)   Resp 14   Ht 5' 4 (1.626 m)   Wt 94 lb 11.2 oz (43 kg)   BMI 16.26 kg/m  Well-developed well-nourished patient in NAD. HEENT reveals PERLA, EOMI, discs not visualized.  Oral cavity is clear. No oral mucosal lesions are identified. Neck is clear without evidence of cervical or supraclavicular adenopathy. Lungs are clear to A&P. Cardiac examination is essentially unremarkable with regular rate and rhythm without murmur rub or thrill. Abdomen is benign with no organomegaly or masses noted. Motor sensory and DTR levels are equal and symmetric in the upper and lower extremities. Cranial nerves II through XII are grossly intact. Proprioception is intact. No peripheral adenopathy or edema is identified. No motor or sensory levels are  noted. Crude visual fields are within normal range.  RADIOLOGY RESULTS: CT scan of her chest reviewed compatible with above-stated findings  PLAN: Present time patient is doing well with stable disease in her chest with no evidence of progression by CT criteria and pleased with her overall progress.  Of asked to see her back in 1 year for follow-up.  Patient knows to call with any concerns.  I would like to take this opportunity to thank you for allowing me to participate in the care of your patient.SABRA Marcey Penton, MD

## 2024-05-11 DIAGNOSIS — R269 Unspecified abnormalities of gait and mobility: Secondary | ICD-10-CM | POA: Diagnosis not present

## 2024-05-11 DIAGNOSIS — M25551 Pain in right hip: Secondary | ICD-10-CM | POA: Diagnosis not present

## 2024-05-17 ENCOUNTER — Ambulatory Visit: Admitting: Pulmonary Disease

## 2024-05-19 DIAGNOSIS — R269 Unspecified abnormalities of gait and mobility: Secondary | ICD-10-CM | POA: Diagnosis not present

## 2024-05-19 DIAGNOSIS — M25551 Pain in right hip: Secondary | ICD-10-CM | POA: Diagnosis not present

## 2024-05-21 DIAGNOSIS — M25551 Pain in right hip: Secondary | ICD-10-CM | POA: Diagnosis not present

## 2024-05-21 DIAGNOSIS — R269 Unspecified abnormalities of gait and mobility: Secondary | ICD-10-CM | POA: Diagnosis not present

## 2024-05-24 ENCOUNTER — Ambulatory Visit: Admitting: Dermatology

## 2024-05-24 DIAGNOSIS — E538 Deficiency of other specified B group vitamins: Secondary | ICD-10-CM | POA: Diagnosis not present

## 2024-05-24 DIAGNOSIS — Z79899 Other long term (current) drug therapy: Secondary | ICD-10-CM | POA: Diagnosis not present

## 2024-05-24 DIAGNOSIS — Z23 Encounter for immunization: Secondary | ICD-10-CM | POA: Diagnosis not present

## 2024-05-24 DIAGNOSIS — R7989 Other specified abnormal findings of blood chemistry: Secondary | ICD-10-CM | POA: Diagnosis not present

## 2024-05-24 DIAGNOSIS — R739 Hyperglycemia, unspecified: Secondary | ICD-10-CM | POA: Diagnosis not present

## 2024-05-24 DIAGNOSIS — R5382 Chronic fatigue, unspecified: Secondary | ICD-10-CM | POA: Diagnosis not present

## 2024-05-24 DIAGNOSIS — C3491 Malignant neoplasm of unspecified part of right bronchus or lung: Secondary | ICD-10-CM | POA: Diagnosis not present

## 2024-05-26 DIAGNOSIS — M25551 Pain in right hip: Secondary | ICD-10-CM | POA: Diagnosis not present

## 2024-05-26 DIAGNOSIS — R269 Unspecified abnormalities of gait and mobility: Secondary | ICD-10-CM | POA: Diagnosis not present

## 2024-05-28 DIAGNOSIS — M25551 Pain in right hip: Secondary | ICD-10-CM | POA: Diagnosis not present

## 2024-05-28 DIAGNOSIS — R269 Unspecified abnormalities of gait and mobility: Secondary | ICD-10-CM | POA: Diagnosis not present

## 2024-05-31 DIAGNOSIS — M25551 Pain in right hip: Secondary | ICD-10-CM | POA: Diagnosis not present

## 2024-05-31 DIAGNOSIS — Z961 Presence of intraocular lens: Secondary | ICD-10-CM | POA: Diagnosis not present

## 2024-05-31 DIAGNOSIS — R269 Unspecified abnormalities of gait and mobility: Secondary | ICD-10-CM | POA: Diagnosis not present

## 2024-05-31 DIAGNOSIS — H40003 Preglaucoma, unspecified, bilateral: Secondary | ICD-10-CM | POA: Diagnosis not present

## 2024-05-31 DIAGNOSIS — H353131 Nonexudative age-related macular degeneration, bilateral, early dry stage: Secondary | ICD-10-CM | POA: Diagnosis not present

## 2024-05-31 DIAGNOSIS — H43813 Vitreous degeneration, bilateral: Secondary | ICD-10-CM | POA: Diagnosis not present

## 2024-06-08 DIAGNOSIS — M25551 Pain in right hip: Secondary | ICD-10-CM | POA: Diagnosis not present

## 2024-06-08 DIAGNOSIS — R269 Unspecified abnormalities of gait and mobility: Secondary | ICD-10-CM | POA: Diagnosis not present

## 2024-06-11 DIAGNOSIS — M25551 Pain in right hip: Secondary | ICD-10-CM | POA: Diagnosis not present

## 2024-06-11 DIAGNOSIS — R269 Unspecified abnormalities of gait and mobility: Secondary | ICD-10-CM | POA: Diagnosis not present

## 2024-06-15 DIAGNOSIS — R269 Unspecified abnormalities of gait and mobility: Secondary | ICD-10-CM | POA: Diagnosis not present

## 2024-06-15 DIAGNOSIS — M25551 Pain in right hip: Secondary | ICD-10-CM | POA: Diagnosis not present

## 2024-06-18 DIAGNOSIS — R269 Unspecified abnormalities of gait and mobility: Secondary | ICD-10-CM | POA: Diagnosis not present

## 2024-06-18 DIAGNOSIS — M25551 Pain in right hip: Secondary | ICD-10-CM | POA: Diagnosis not present

## 2024-06-22 DIAGNOSIS — M25551 Pain in right hip: Secondary | ICD-10-CM | POA: Diagnosis not present

## 2024-06-22 DIAGNOSIS — R269 Unspecified abnormalities of gait and mobility: Secondary | ICD-10-CM | POA: Diagnosis not present

## 2024-06-25 DIAGNOSIS — H6123 Impacted cerumen, bilateral: Secondary | ICD-10-CM | POA: Diagnosis not present

## 2024-06-25 DIAGNOSIS — M25551 Pain in right hip: Secondary | ICD-10-CM | POA: Diagnosis not present

## 2024-06-25 DIAGNOSIS — R269 Unspecified abnormalities of gait and mobility: Secondary | ICD-10-CM | POA: Diagnosis not present

## 2024-06-25 DIAGNOSIS — H90A22 Sensorineural hearing loss, unilateral, left ear, with restricted hearing on the contralateral side: Secondary | ICD-10-CM | POA: Diagnosis not present

## 2024-06-25 DIAGNOSIS — H9211 Otorrhea, right ear: Secondary | ICD-10-CM | POA: Diagnosis not present

## 2024-06-25 DIAGNOSIS — M272 Inflammatory conditions of jaws: Secondary | ICD-10-CM | POA: Diagnosis not present

## 2024-06-28 DIAGNOSIS — M25551 Pain in right hip: Secondary | ICD-10-CM | POA: Diagnosis not present

## 2024-06-28 DIAGNOSIS — R269 Unspecified abnormalities of gait and mobility: Secondary | ICD-10-CM | POA: Diagnosis not present

## 2024-06-30 ENCOUNTER — Encounter: Payer: Self-pay | Admitting: Dermatology

## 2024-06-30 ENCOUNTER — Ambulatory Visit: Admitting: Dermatology

## 2024-06-30 DIAGNOSIS — L821 Other seborrheic keratosis: Secondary | ICD-10-CM

## 2024-06-30 DIAGNOSIS — L814 Other melanin hyperpigmentation: Secondary | ICD-10-CM

## 2024-06-30 DIAGNOSIS — L578 Other skin changes due to chronic exposure to nonionizing radiation: Secondary | ICD-10-CM

## 2024-06-30 DIAGNOSIS — L570 Actinic keratosis: Secondary | ICD-10-CM | POA: Diagnosis not present

## 2024-06-30 NOTE — Patient Instructions (Addendum)

## 2024-06-30 NOTE — Progress Notes (Signed)
 Follow-Up Visit   Subjective  [name] is a 83 y.o. female who presents for the following: AK 12m f/u R nasal tip, R paranasal , R nasal dorsum.  She also has brown spots on her neck she would like removed   The following portions of the chart were reviewed this encounter and updated as appropriate: medications, allergies, medical history  Review of Systems:  No other skin or systemic complaints except as noted in HPI or Assessment and Plan.  Objective  Well appearing patient in no apparent distress; mood and affect are within normal limits.   A focused examination was performed of the following areas: face  Relevant exam findings are noted in the Assessment and Plan.  L nasal tip x 3 (3) Pink scaly macules R neck x 3 (3) Stuck on waxy tan macules    Assessment & Plan   ACTINIC DAMAGE - chronic, secondary to cumulative UV radiation exposure/sun exposure over time - diffuse scaly erythematous macules with underlying dyspigmentation - Recommend daily broad spectrum sunscreen SPF 30+ to sun-exposed areas, reapply every 2 hours as needed.  - Recommend staying in the shade or wearing long sleeves, sun glasses (UVA+UVB protection) and wide brim hats (4-inch brim around the entire circumference of the hat). - Call for new or changing lesions.   SEBORRHEIC KERATOSIS R neck, face - Stuck-on, waxy, tan-brown papules and/or plaques  - Benign-appearing - Discussed benign etiology and prognosis. - Observe - Call for any changes  LENTIGINES Exam: scattered tan macules face, neck Due to sun exposure Treatment Plan: Benign-appearing, observe. Recommend daily broad spectrum sunscreen SPF 30+ to sun-exposed areas, reapply every 2 hours as needed.  Call for any changes  AK (ACTINIC KERATOSIS) (3) L nasal tip x 3 (3) Actinic keratoses are precancerous spots that appear secondary to cumulative UV radiation exposure/sun exposure over time. They are chronic with expected  duration over 1 year. A portion of actinic keratoses will progress to squamous cell carcinoma of the skin. It is not possible to reliably predict which spots will progress to skin cancer and so treatment is recommended to prevent development of skin cancer.  Recommend daily broad spectrum sunscreen SPF 30+ to sun-exposed areas, reapply every 2 hours as needed.  Recommend staying in the shade or wearing long sleeves, sun glasses (UVA+UVB protection) and wide brim hats (4-inch brim around the entire circumference of the hat). Call for new or changing lesions. Destruction of lesion - L nasal tip x 3 (3)  Destruction method: cryotherapy   Informed consent: discussed and consent obtained   Lesion destroyed using liquid nitrogen: Yes   Region frozen until ice ball extended beyond lesion: Yes   Outcome: patient tolerated procedure well with no complications   Post-procedure details: wound care instructions given   Additional details:  Prior to procedure, discussed risks of blister formation, small wound, skin dyspigmentation, or rare scar following cryotherapy. Recommend Vaseline ointment to treated areas while healing.   SEBORRHEIC KERATOSIS (3) R neck x 3 (3) Discussed cosmetic procedure LN2 of SKs, noncovered.  $60 for 1st lesion and $15 for each additional lesion if done on the same day.  Maximum charge $350.  One touch-up treatment included no charge. Discussed risks of treatment including dyspigmentation, small scar, and/or recurrence. Recommend daily broad spectrum sunscreen SPF 30+/photoprotection to treated areas once healed.   3 Sks treated today for cosmetic fee $90 Destruction of lesion - R neck x 3 (3)  Destruction method: cryotherapy  Informed consent: discussed and consent obtained   Lesion destroyed using liquid nitrogen: Yes   Region frozen until ice ball extended beyond lesion: Yes   Outcome: patient tolerated procedure well with no complications   Post-procedure details: wound  care instructions given   Additional details:  Prior to procedure, discussed risks of blister formation, small wound, skin dyspigmentation, or rare scar following cryotherapy. Recommend Vaseline ointment to treated areas while healing.     Return in about 5 months (around 11/28/2024) for TBSE recheck cosmetic SKs, Hx of AKs, Hx of SCC IS.  I, Sonya Hupman, RMA, am acting as scribe for Rexene Rattler, MD .   Documentation: I have reviewed the above documentation for accuracy and completeness, and I agree with the above.  Rexene Rattler, MD

## 2024-07-01 ENCOUNTER — Ambulatory Visit: Admitting: Pulmonary Disease

## 2024-07-06 DIAGNOSIS — M25551 Pain in right hip: Secondary | ICD-10-CM | POA: Diagnosis not present

## 2024-07-06 DIAGNOSIS — R269 Unspecified abnormalities of gait and mobility: Secondary | ICD-10-CM | POA: Diagnosis not present

## 2024-07-14 DIAGNOSIS — R269 Unspecified abnormalities of gait and mobility: Secondary | ICD-10-CM | POA: Diagnosis not present

## 2024-07-14 DIAGNOSIS — M25551 Pain in right hip: Secondary | ICD-10-CM | POA: Diagnosis not present

## 2024-07-16 DIAGNOSIS — M25551 Pain in right hip: Secondary | ICD-10-CM | POA: Diagnosis not present

## 2024-07-16 DIAGNOSIS — R269 Unspecified abnormalities of gait and mobility: Secondary | ICD-10-CM | POA: Diagnosis not present

## 2024-07-19 ENCOUNTER — Ambulatory Visit: Admitting: Podiatry

## 2024-07-19 DIAGNOSIS — M79676 Pain in unspecified toe(s): Secondary | ICD-10-CM | POA: Diagnosis not present

## 2024-07-19 DIAGNOSIS — B351 Tinea unguium: Secondary | ICD-10-CM | POA: Diagnosis not present

## 2024-07-19 NOTE — Progress Notes (Signed)
 Subjective:  Patient ID: [name], female    DOB: 08-Feb-1941,  MRN: [id number] HPI Chief Complaint  Patient presents with   Nail Problem    Thick, discolored, painful toenails -Left great toenail is painful    83 y.o. female presents with the above complaint.   ROS: Denies fever chills nausea mobic muscle aches pains calf pain back pain chest pain shortness of breath.  Past Medical History:  Diagnosis Date   Actinic keratosis 11/10/2019   Left superior shoulder. Bx proven.   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 SCCis   Past Surgical History:  Procedure Laterality Date   ABDOMINAL HYSTERECTOMY     partial   CARDIAC CATHETERIZATION     [facility]   cataract     COLONOSCOPY WITH PROPOFOL  N/A 06/22/2015   Procedure: COLONOSCOPY WITH PROPOFOL ;  Surgeon: Deward CINDERELLA Piedmont, MD;  Location: [facility] ENDOSCOPY;  Service: Gastroenterology;  Laterality: N/A;   ESOPHAGOGASTRODUODENOSCOPY (EGD) WITH PROPOFOL  N/A 06/22/2015   Procedure: ESOPHAGOGASTRODUODENOSCOPY (EGD) WITH PROPOFOL ;  Surgeon: Deward CINDERELLA Piedmont, MD;  Location: [facility] ENDOSCOPY;  Service: Gastroenterology;  Laterality: N/A;   ESOPHAGOGASTRODUODENOSCOPY (EGD) WITH PROPOFOL  N/A 07/12/2020   Procedure: ESOPHAGOGASTRODUODENOSCOPY (EGD) WITH PROPOFOL ;  Surgeon: Toledo, Ladell POUR, MD;  Location: [facility] ENDOSCOPY;  Service: Gastroenterology;  Laterality: N/A;   INTRAMEDULLARY (IM) NAIL INTERTROCHANTERIC Right 03/24/2023   Procedure: INTRAMEDULLARY (IM) NAIL FEMUR;  Surgeon: Kendal Franky SQUIBB, MD;  Location: [facility] OR;  Service: Orthopedics;  Laterality: Right;   PARTIAL HYSTERECTOMY     TONSILLECTOMY      Current Outpatient Medications:    Abaloparatide (TYMLOS [place]),  Inject 0.8 mLs into the skin daily., Disp: , Rfl:    cholecalciferol  (VITAMIN D3) 25 MCG (1000 UNIT) tablet, Take 1,000 Units by mouth daily., Disp: , Rfl:    ELIQUIS  2.5 MG TABS tablet, TAKE 1 TABLET BY MOUTH 2 TIMES A DAY, Disp: 60 tablet, Rfl: 6   famotidine  (PEPCID ) 40 MG tablet, Take 40 mg by mouth at bedtime., Disp: , Rfl:    Fluticasone Furoate  (ARNUITY ELLIPTA ) 100 MCG/ACT AEPB, Inhale 1 puff into the lungs daily., Disp: 30 each, Rfl: 1   lovastatin (MEVACOR) 40 MG tablet, Take 40 mg by mouth at bedtime., Disp: , Rfl:    methocarbamol  (ROBAXIN ) 500 MG tablet, Take 1 tablet (500 mg total) by mouth every 6 (six) hours as needed for muscle spasms. (Patient not taking: Reported on 04/29/2024), Disp: 20 tablet, Rfl: 0   Multiple Vitamin (MULTIVITAMIN) tablet, Take 1 tablet by mouth daily., Disp: , Rfl:    omeprazole (PRILOSEC) 40 MG capsule, Take 40 mg by mouth daily., Disp: , Rfl:    polyethylene glycol powder (MIRALAX ) 17 GM/SCOOP powder, Take 17 g by mouth 2 (two) times daily as needed for moderate constipation., Disp: , Rfl:    propranolol (INDERAL) 20 MG tablet, Take 20 mg by mouth 2 (two) times daily., Disp: , Rfl:    sodium chloride  1 g tablet, Take 1 g by mouth 3 (three) times daily., Disp: , Rfl:    triamcinolone  (KENALOG ) 0.025 % ointment, Apply 1 Application topically 2 (two) times daily. For 3-5 days. (Patient not taking: Reported on 04/29/2024),  Disp: 30 g, Rfl: 0  Allergies  Allergen Reactions   Bisphosphonates Other (See Comments)    Swallowing problems    Boniva [Ibandronate] Other (See Comments)    Swallowing problems    Fosamax [Alendronate Sodium] Other (See Comments)    Swallowing problems    Lipitor [Atorvastatin] Other (See Comments)    Knee pain    Etodolac Rash   Review of Systems Objective:  There were no vitals filed for this visit.  General: Well developed, nourished, in no acute distress, alert and oriented x3   Dermatological: Skin is warm, dry and supple  bilateral. Nails x 10 are well maintained though the hallux nails are thicker and more dystrophic than the other nails but all appear to be dystrophic possibly mycotic.  And they are all painful.; remaining integument appears unremarkable at this time. There are no open sores, no preulcerative lesions, no rash or signs of infection present.  She also has thick painful benign lesions plantar aspect of the forefoot bilateral  Vascular: Dorsalis Pedis artery and Posterior Tibial artery pedal pulses are 2/4 bilateral with immedate capillary fill time. Pedal hair growth present. No varicosities and no lower extremity edema present bilateral.   Neruologic: Grossly intact via light touch bilateral. Vibratory intact via tuning fork bilateral. Protective threshold with Semmes Wienstein monofilament intact to all pedal sites bilateral. Patellar and Achilles deep tendon reflexes 2+ bilateral. No Babinski or clonus noted bilateral.   Musculoskeletal: No gross boney pedal deformities bilateral. No pain, crepitus, or limitation noted with foot and ankle range of motion bilateral. Muscular strength 5/5 in all groups tested bilateral.  Pes planovalgus bilateral  Gait: Unassisted, Nonantalgic.    Radiographs:  None taken  Assessment & Plan:   Assessment: Pain in limb secondary to onychomycosis and nail dystrophy.  Pes planovalgus.  Painful benign lesions forefoot bilateral  Plan: Debridement of toenails 1 through 5 bilateral.  Debrided painful neoplasms bilateral foot.  She like to consider orthotics to help offload the forefoot bilateral.     [name], [place]

## 2024-07-21 DIAGNOSIS — R269 Unspecified abnormalities of gait and mobility: Secondary | ICD-10-CM | POA: Diagnosis not present

## 2024-07-21 DIAGNOSIS — M25551 Pain in right hip: Secondary | ICD-10-CM | POA: Diagnosis not present

## 2024-07-22 DIAGNOSIS — Z23 Encounter for immunization: Secondary | ICD-10-CM | POA: Diagnosis not present

## 2024-07-22 DIAGNOSIS — R600 Localized edema: Secondary | ICD-10-CM | POA: Diagnosis not present

## 2024-07-22 DIAGNOSIS — M79671 Pain in right foot: Secondary | ICD-10-CM | POA: Diagnosis not present

## 2024-07-22 DIAGNOSIS — M79672 Pain in left foot: Secondary | ICD-10-CM | POA: Diagnosis not present

## 2024-07-23 DIAGNOSIS — R269 Unspecified abnormalities of gait and mobility: Secondary | ICD-10-CM | POA: Diagnosis not present

## 2024-07-23 DIAGNOSIS — M25551 Pain in right hip: Secondary | ICD-10-CM | POA: Diagnosis not present

## 2024-07-26 DIAGNOSIS — R269 Unspecified abnormalities of gait and mobility: Secondary | ICD-10-CM | POA: Diagnosis not present

## 2024-07-26 DIAGNOSIS — M25551 Pain in right hip: Secondary | ICD-10-CM | POA: Diagnosis not present

## 2024-08-03 DIAGNOSIS — G250 Essential tremor: Secondary | ICD-10-CM | POA: Diagnosis not present

## 2024-08-04 DIAGNOSIS — R269 Unspecified abnormalities of gait and mobility: Secondary | ICD-10-CM | POA: Diagnosis not present

## 2024-08-04 DIAGNOSIS — M25551 Pain in right hip: Secondary | ICD-10-CM | POA: Diagnosis not present

## 2024-08-11 DIAGNOSIS — M25551 Pain in right hip: Secondary | ICD-10-CM | POA: Diagnosis not present

## 2024-08-11 DIAGNOSIS — R269 Unspecified abnormalities of gait and mobility: Secondary | ICD-10-CM | POA: Diagnosis not present

## 2024-09-16 ENCOUNTER — Telehealth: Payer: Self-pay | Admitting: Cardiovascular Disease

## 2024-09-16 NOTE — Telephone Encounter (Signed)
 Patient c/o Palpitations:  STAT if patient reporting lightheadedness, shortness of breath, or chest pain  How long have you had palpitations/irregular HR/ Afib? Are you having the symptoms now? No, earlier today   Are you currently experiencing lightheadedness, SOB or CP? No   Do you have a history of afib (atrial fibrillation) or irregular heart rhythm? Yes   Have you checked your BP or HR? (document readings if available): 104/49 BP 55 HR   Are you experiencing any other symptoms? Occasional SOB, last time was Sunday   Pt has been having episodes of afib on and off. Pt scheduled w/ Dr. Gollan 1/19. Pt asking if she should do anything until then.  Please advise.

## 2024-09-16 NOTE — Telephone Encounter (Signed)
 Called and spoke with the patient.  Patient states that she feels like she is in A-Fib and her BP machine tells her that she is in A-Fib.  Patient endorses on and off shortness of breath, denies any chest pain, light headedness, or dizziness.  Patient has an appointment scheduled for 09/20/24 w/Dr. Gollan.  Patient offered an earlier appointment with one of the APPs for tomorrow, which the patient declined.  Patient advised to take it easy for the weekend and not exert herself too much and go to the emergency room for any worsening symptoms of shortness of breath or any chest pain.  Patient verbalized understanding with all questions and concerns addressed at this time.

## 2024-09-17 NOTE — Progress Notes (Unsigned)
 Cardiology Office Note  Date:  09/20/2024   ID:  [name], [name] 1940-10-04, MRN [id number]  PCP:  Cleotilde Oneil FALCON, MD   Chief Complaint  Patient presents with   Follow-up    Patient c/o palpitations and shortness of breath at rest on this past Saturday.     HPI:  [name] is a 84 year old woman with past medical history of Paroxysmal atrial fibrillation Coronary artery disease Essential hypertension Hyperlipidemia Echo July 2023 severe TR with mildly elevated right heart pressures mechanical fall and was found to have R midshaft femoral fracture.  Underwent intramedullary nailing on 03/24/2023.  Hospital course complicated by new onset A-fib with RVR and hyponatremia.  Echo with EF 50%  lung cancer, completed XRT 5 treatments with Dr. Camelia Coronary calcium  score of 163, nonobstructive coronary disease on cardiac CTA Who presents for follow-up of her atrial fibrillation  LOV 3/25 PNA jan 2025, stayed weak since that time with slow improvement No regular activity or walking program Continued gait instability Doing PT  Having good days and bad days, tired sometimes Does church activities  In and out of afib/palpitations, past 3 days Was taking over-the-counter cold medication BP cuff reads error  Prior Holter monitor with primary care with no atrial fibrillation, PACs, PVCs noted  Imaging reviewed Ct chest 7/25  Stable post radiation changes within the right lower lobe. No evidence of residual or recurrent disease within the chest. 2. Moderate coronary artery calcification.  Cardiac CTA 6/25 1. Coronary calcium  score of 163. This was 52nd percentile for age and sex matched control. 2. Normal coronary origin with right dominance. 3. No mild proximal LAD stenosis (25%). 4. Minimal RCA stenosis (<25%).  Labs reviewed Total chol 183, LDL 85  Husband with medical issues, recent stent placement, needs hip surgery  EKG personally reviewed by myself  on todays visit EKG Interpretation Date/Time:  Monday September 20 2024 15:13:02 EST Ventricular Rate:  95 PR Interval:  138 QRS Duration:  84 QT Interval:  348 QTC Calculation: 437 R Axis:   32  Text Interpretation: Normal sinus rhythm Normal ECG When compared with ECG of 17-Jan-2024 08:18, No significant change was found Confirmed by Perla Lye [phone]) on 09/20/2024 3:33:48 PM    hospitalization after fall 03/24/2023 Fracture femur, required surgery March 23, 2024 Developed atrial fibrillation later in hospitalization,  EKG documenting July 29 with A-fib rate 130s Discharged on Eliquis , metoprolol , rate controlled Low sodium noted throughout her hospital course  Has completed rehab 2 weeks Home PT, uses a walker, gait improving, no recent falls  Echo March 31, 2023 EF 50% mild MR trivial TR  Prior strong family history of stroke Reports that she stopped the Eliquis  2.5 twice daily when prescription ran out Reduced dose Eliquis  for age over 58 and weight less than 60 kg  CHADS VASC 5  Prior ejection fraction 45 to 50% By report prior stress test with no ischemia 2022  Repeat echocardiogram April 01, 2022 Normal left and right ventricular function  Mother with afib, CVA Sister with mini CVA  PMH:   has a past medical history of Actinic keratosis (11/10/2019), Complication of anesthesia, Essential hypertension (03/24/2023), GERD (gastroesophageal reflux disease), History of actinic keratosis (06/15/2020), History of osteoporosis, Hyperlipidemia, PONV (postoperative nausea and vomiting), and 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  PSH:    Past Surgical History:  Procedure Laterality Date   ABDOMINAL HYSTERECTOMY     partial   CARDIAC CATHETERIZATION  [facility]   cataract     COLONOSCOPY WITH PROPOFOL  N/A 06/22/2015   Procedure: COLONOSCOPY WITH PROPOFOL ;  Surgeon: Deward CINDERELLA Piedmont, MD;  Location[facility] ENDOSCOPY;  Service: Gastroenterology;  Laterality: N/A;    ESOPHAGOGASTRODUODENOSCOPY (EGD) WITH PROPOFOL  N/A 06/22/2015   Procedure: ESOPHAGOGASTRODUODENOSCOPY (EGD) WITH PROPOFOL ;  Surgeon: Deward CINDERELLA Piedmont, MD;  Location: [facility] ENDOSCOPY;  Service: Gastroenterology;  Laterality: N/A;   ESOPHAGOGASTRODUODENOSCOPY (EGD) WITH PROPOFOL  N/A 07/12/2020   Procedure: ESOPHAGOGASTRODUODENOSCOPY (EGD) WITH PROPOFOL ;  Surgeon: Toledo, Ladell POUR, MD;  Location: [facility] ENDOSCOPY;  Service: Gastroenterology;  Laterality: N/A;   INTRAMEDULLARY (IM) NAIL INTERTROCHANTERIC Right 03/24/2023   Procedure: INTRAMEDULLARY (IM) NAIL FEMUR;  Surgeon: Kendal Franky SQUIBB, MD;  Location: [facility] OR;  Service: Orthopedics;  Laterality: Right;   PARTIAL HYSTERECTOMY     TONSILLECTOMY      Current Outpatient Medications  Medication Sig Dispense Refill   Abaloparatide (TYMLOS [place]) Inject 0.8 mLs into the skin daily.     cholecalciferol  (VITAMIN D3) 25 MCG (1000 UNIT) tablet Take 1,000 Units by mouth daily.     ELIQUIS  2.5 MG TABS tablet TAKE 1 TABLET BY MOUTH 2 TIMES A DAY 60 tablet 6   famotidine  (PEPCID ) 40 MG tablet Take 40 mg by mouth at bedtime.     lovastatin (MEVACOR) 40 MG tablet Take 40 mg by mouth at bedtime.     Multiple Vitamin (MULTIVITAMIN) tablet Take 1 tablet by mouth daily.     omeprazole (PRILOSEC) 40 MG capsule Take 40 mg by mouth daily.     polyethylene glycol powder (MIRALAX ) 17 GM/SCOOP powder Take 17 g by mouth 2 (two) times daily as needed for moderate constipation.     propranolol (INDERAL) 20 MG tablet Take 20 mg by mouth 2 (two) times daily.     sodium chloride  1 g tablet Take 1 g by mouth 3 (three) times daily.     Fluticasone Furoate  (ARNUITY ELLIPTA ) 100 MCG/ACT AEPB Inhale 1 puff into the lungs daily. (Patient not taking: Reported on 09/20/2024) 30 each 1   methocarbamol  (ROBAXIN ) 500 MG tablet Take 1 tablet (500 mg total) by mouth every 6 (six) hours as needed for muscle spasms. (Patient not taking: Reported on 09/20/2024) 20 tablet 0   triamcinolone  (KENALOG ) 0.025 %  ointment Apply 1 Application topically 2 (two) times daily. For 3-5 days. (Patient not taking: Reported on 09/20/2024) 30 g 0   No current facility-administered medications for this visit.     Allergies:   Atorvastatin calcium , Fosamax [alendronate sodium], Ibandronate, Atorvastatin, Bisphosphonates, and Etodolac   Social History:  The patient  reports that she has never smoked. She has been exposed to tobacco smoke. She has never used smokeless tobacco. She reports that she does not drink alcohol and does not use drugs.   Family History:   family history includes Breast cancer (age of onset: 41) in her maternal aunt; Coronary artery disease in an other family member; Heart disease in her father; Heart failure in her father and mother; Transient ischemic attack in her mother.    Review of Systems: Review of Systems  Constitutional: Negative.   HENT: Negative.    Respiratory: Negative.    Cardiovascular:  Positive for palpitations.  Gastrointestinal: Negative.   Musculoskeletal: Negative.   Neurological: Negative.   Psychiatric/Behavioral: Negative.    All other systems reviewed and are negative.  PHYSICAL EXAM: VS:  BP (!) 118/58 (BP Location: Left Arm, Patient Position: Sitting, Cuff Size: Normal)   Pulse 95   Ht  5' 3 (1.6 m)   Wt 90 lb (40.8 kg)   SpO2 97%   BMI 15.94 kg/m  , BMI Body mass index is 15.94 kg/m. Constitutional:  oriented to person, place, and time. No distress.  HENT:  Head: Normocephalic and atraumatic.  Eyes:  no discharge. No scleral icterus.  Neck: Normal range of motion. Neck supple. No JVD present.  Cardiovascular: Normal rate, regular rhythm, normal heart sounds and intact distal pulses. Exam reveals no gallop and no friction rub. No edema No murmur heard. Pulmonary/Chest: Effort normal and breath sounds normal. No stridor. No respiratory distress.  no wheezes.  no rales.  no tenderness.  Abdominal: Soft.  no distension.  no tenderness.   Musculoskeletal: Normal range of motion.  no  tenderness or deformity.  Neurological:  normal muscle tone. Coordination normal. No atrophy Skin: Skin is warm and dry. No rash noted. not diaphoretic.  Psychiatric:  normal mood and affect. behavior is normal. Thought content normal.    Recent Labs: 01/17/2024: Hemoglobin 11.3; Platelets 176; TSH 0.889 01/18/2024: BUN 13; Creatinine, Ser 0.52; Potassium 3.9; Sodium 130    Lipid Panel No results found for: CHOL, HDL, LDLCALC, TRIG    Wt Readings from Last 3 Encounters:  09/20/24 90 lb (40.8 kg)  05/10/24 94 lb 11.2 oz (43 kg)  04/29/24 93 lb 12.8 oz (42.5 kg)     ASSESSMENT AND PLAN:  Problem List Items Addressed This Visit       Cardiology Problems   Essential hypertension   Relevant Orders   EKG 12-Lead (Completed)   Paroxysmal atrial fibrillation (HCC) - Primary   Relevant Orders   EKG 12-Lead (Completed)   Chronic mitral valve regurgitation   Relevant Orders   EKG 12-Lead (Completed)   CAD, NATIVE VESSEL   Relevant Orders   EKG 12-Lead (Completed)    Paroxysmal atrial fibrillation Documented on hospitalization July 2024,  CHADS VASC 5 Tolerating Eliquis  2.5 twice daily Currently not on metoprolol , she is taking propranolol 20 twice daily -Reports having palpitations likely secondary to PACs, PVCs, also appreciated on exam today, less likely atrial fibrillation -If symptoms get worse, Zio monitor offered  Essential hypertension Blood pressure is well controlled on today's visit. No changes made to the medications.  Hyperlipidemia On lovastatin 40 daily Followed by primary care  Aortic atherosclerosis Noted on CT scan June 2024, mild to moderate Continue statin as above  Coronary calcification Low calcium  score Nonobstructive coronary disease on cardiac CTA Currently with no symptoms of angina. No further workup at this time. Continue current medication regimen. On statin, Eliquis  in place of  aspirin   Weight loss Discussed strategies to maintain weight  Signed, Velinda Lunger, M.D., Ph.D. [facility] [place], [place] [phone]

## 2024-09-20 ENCOUNTER — Encounter: Payer: Self-pay | Admitting: Cardiovascular Disease

## 2024-09-20 ENCOUNTER — Ambulatory Visit: Attending: Cardiovascular Disease | Admitting: Cardiovascular Disease

## 2024-09-20 VITALS — BP 118/58 | HR 95 | Ht 63.0 in | Wt 90.0 lb

## 2024-09-20 DIAGNOSIS — I480 Paroxysmal atrial fibrillation: Secondary | ICD-10-CM

## 2024-09-20 DIAGNOSIS — I10 Essential (primary) hypertension: Secondary | ICD-10-CM | POA: Diagnosis not present

## 2024-09-20 DIAGNOSIS — I25118 Atherosclerotic heart disease of native coronary artery with other forms of angina pectoris: Secondary | ICD-10-CM

## 2024-09-20 DIAGNOSIS — I340 Nonrheumatic mitral (valve) insufficiency: Secondary | ICD-10-CM

## 2024-09-20 MED ORDER — APIXABAN 2.5 MG PO TABS: 2.5000 mg | ORAL_TABLET | Freq: Two times a day (BID) | ORAL | 6 refills | Status: AC

## 2024-09-20 NOTE — Patient Instructions (Signed)

## 2024-11-30 ENCOUNTER — Ambulatory Visit (INDEPENDENT_AMBULATORY_CARE_PROVIDER_SITE_OTHER): Admitting: Dermatology

## 2025-05-19 ENCOUNTER — Ambulatory Visit: Admitting: Radiation Oncology
